# Patient Record
Sex: Female | Born: 1990 | Race: Black or African American
Health system: Southern US, Academic
[De-identification: ages and names within clinical notes are randomized; demographics above are authoritative.]

## PROBLEM LIST (undated history)

## (undated) ENCOUNTER — Encounter

## (undated) ENCOUNTER — Telehealth

## (undated) DIAGNOSIS — I82401 Acute embolism and thrombosis of unspecified deep veins of right lower extremity: Secondary | ICD-10-CM

## (undated) DIAGNOSIS — S82899A Other fracture of unspecified lower leg, initial encounter for closed fracture: Secondary | ICD-10-CM

## (undated) DIAGNOSIS — I824Y1 Acute embolism and thrombosis of unspecified deep veins of right proximal lower extremity: Secondary | ICD-10-CM

## (undated) DIAGNOSIS — E119 Type 2 diabetes mellitus without complications: Secondary | ICD-10-CM

## (undated) DIAGNOSIS — U071 COVID-19: Secondary | ICD-10-CM

---

## 2016-12-04 DIAGNOSIS — R739 Hyperglycemia, unspecified: Secondary | ICD-10-CM

## 2016-12-04 DIAGNOSIS — E118 Type 2 diabetes mellitus with unspecified complications: Secondary | ICD-10-CM

## 2016-12-04 DIAGNOSIS — K0889 Other specified disorders of teeth and supporting structures: Secondary | ICD-10-CM

## 2016-12-04 DIAGNOSIS — Z794 Long term (current) use of insulin: Secondary | ICD-10-CM

## 2016-12-04 DIAGNOSIS — E1165 Type 2 diabetes mellitus with hyperglycemia: Secondary | ICD-10-CM

## 2016-12-04 DIAGNOSIS — Z79899 Other long term (current) drug therapy: Secondary | ICD-10-CM

## 2016-12-04 DIAGNOSIS — S025XXA Fracture of tooth (traumatic), initial encounter for closed fracture: Secondary | ICD-10-CM

## 2016-12-04 DIAGNOSIS — K029 Dental caries, unspecified: Secondary | ICD-10-CM

## 2016-12-04 DIAGNOSIS — K0401 Reversible pulpitis: Principal | ICD-10-CM

## 2016-12-04 DIAGNOSIS — E119 Type 2 diabetes mellitus without complications: Principal | ICD-10-CM

## 2016-12-04 MED ORDER — HYDROCODONE-ACETAMINOPHEN 5-325 MG PO TABS
1 | ORAL_TABLET | Freq: Once | ORAL | Status: CP
Start: 2016-12-04 — End: ?

## 2016-12-04 MED ORDER — PENICILLIN V POTASSIUM 500 MG PO TABS
500 mg | Freq: Four times a day (QID) | ORAL | 0 refills | Status: CP
Start: 2016-12-04 — End: ?

## 2016-12-04 MED ORDER — CHLORHEXIDINE GLUCONATE 0.12 % MT SOLN
0 refills | Status: CP
Start: 2016-12-04 — End: ?

## 2016-12-04 MED ORDER — BOLUS IV FLUID JX
Freq: Once | INTRAVENOUS | Status: DC
Start: 2016-12-04 — End: 2016-12-05

## 2016-12-04 MED ORDER — HYDROCODONE-ACETAMINOPHEN 5-325 MG PO TABS
1 | ORAL_TABLET | Freq: Four times a day (QID) | ORAL | 0 refills | Status: CP | PRN
Start: 2016-12-04 — End: ?

## 2016-12-05 ENCOUNTER — Inpatient Hospital Stay: Admit: 2016-12-05 | Discharge: 2016-12-05

## 2016-12-05 NOTE — ED Provider Notes
Radial pulses are 2+ on the right side, and 2+ on the left side.        Dorsalis pedis pulses are 2+ on the right side, and 2+ on the left side.   Pulmonary/Chest: Effort normal and breath sounds normal. No stridor. No respiratory distress. She has no wheezes. She has no rales. She exhibits no tenderness.   Abdominal: Soft. Bowel sounds are normal. She exhibits no distension and no mass. There is no tenderness. There is no rebound and no guarding.   Musculoskeletal: She exhibits no edema, tenderness or deformity.   Lymphadenopathy:     She has no cervical adenopathy.   Neurological: She is alert and oriented to person, place, and time. No cranial nerve deficit.   Skin: Skin is warm. No rash noted. She is not diaphoretic. No erythema. No pallor.   Psychiatric: She has a normal mood and affect. Her behavior is normal.   Nursing note and vitals reviewed.      Differential DDx: ***    Is this an Emergent Medical Condition? {SH ED EMERGENT MEDICAL CONDITION:[phone]}  409.901 FS  641.19 FS  627.732 (16) FS    ED Workup   Procedures    Labs:  -   POCT GLUCOSE - Abnormal        Result Value Ref Range    Glucose (Meter) 413 (*) 60.0 - 99.0 mg/dL   POCT URINALYSIS AUTO W/O MICROSCOPY - Abnormal     Color -Ur Yellow      Clarity, UA Clear      Spec Grav 1.015  1.003 - 1.030    pH 6.5 (*) 7.4 - 7.4    Urobilinogen -Ur 1.0  <=2.0 E.U./dL    Nitrite -Ur Negative  Negative    Protein-UA Negative  Negative mg/dL    Glucose -Ur 161.0500.0 (*) Negative mg/dL    Blood, UA Trace (*) Negative    WBC, UA Negative  Negative    Bilirubin -Ur Negative  Negative    Ketones -UR Negative  Negative   POCT URINE PREGNANCY - Normal    Preg Test, Urine (POC) Negative  Negative   POCT GLUCOSE   POCT URINALYSIS AUTO W/O MICROSCOPY   POCT URINE PREGNANCY         Imaging (Read by ED Provider):  {Imaging findings:[id number]}      EKG (Read by ED Provider):  {EKG findings:[phone]}        ED Course & Re-Evaluation     ED Course

## 2016-12-05 NOTE — ED Triage Notes
26yo BF ambulates to triage with steady gait. C/O dental pain on the UR quadrant  x 3 weeks. Pt states she has bad taste in her mouth and can feel the small pieces of her tooth crumbling when she eat. Pt states her pain increases when she eats something sweet or cold.Denies difficulty swallowing or breathing. No facial swelling noted in triage. Denies taking  OTC medications to relive symptoms. Pt does not have a dentist at this time. VSS, RR even and unlabored, NAD noted at this tme. Pt to [facility] to await further evaluation.

## 2016-12-05 NOTE — ED Provider Notes
ED Disposition   ED Disposition: Discharge      ED Clinical Impression   ED Clinical Impression:   Pain, dental  Pulpitis  Blood glucose elevated  Type 2 diabetes mellitus with complication, without long-term current use of insulin      ED Patient Status   Patient Status:   Good        ED Medical Evaluation Initiated   Medical Evaluation Initiated:   Yes, filed at 12/04/16 2058  by Dionne MiloHavyarimana, Juvenal, MD             Dionne MiloHavyarimana, Juvenal, MD  Resident  12/05/16 [phone]

## 2016-12-05 NOTE — ED Provider Notes
Constitutional: Negative for fever.   HENT: Negative for nasal congestion, facial swelling, drooling, mouth sores and neck pain.    Neurological: Negative for headaches.       Physical Exam       ED Triage Vitals   BP 12/04/16 1955 123/81   Pulse 12/04/16 1955 111   Resp 12/04/16 1955 18   Temp 12/04/16 1955 36.9 ?C (98.5 ?F)   Temp src 12/04/16 1955 Oral   Height 12/04/16 1955 1.676 m   Weight 12/04/16 1955 85.7 kg   SpO2 12/04/16 1955 100 %   BMI (Calculated) 12/04/16 1955 30.57             Physical Exam    Differential DDx: ***    Is this an Emergent Medical Condition? {SH ED EMERGENT MEDICAL CONDITION:[id number]}  409.901 FS  641.19 FS  627.732 (16) FS    ED Workup   Procedures    Labs:  - - No data to display      Imaging (Read by ED Provider):  {Imaging findings:[phone]}      EKG (Read by ED Provider):  {EKG findings:[phone]}        ED Course & Re-Evaluation     ED Course     Dionne MiloJuvenal Havyarimana, MD 9:04 PM 12/04/2016        MDM   Decide to obtain history from someone other than the patient: {SH ED Lamonte SakaiJX MDM - OBTAIN ZOXWRUE:45409}HISTORY:28378}    Decide to obtain previous medical records: [facility]{SH ED Lamonte SakaiJX MDM - PREVIOUS MED REC - NO WJX:91478}YES:28380}    Clinical Lab Test(s): {SH ED Lamonte SakaiJX MDM ORDERED AND REVIEWED:28124}    Diagnostic Tests (Radiology, EKG): {SH ED Lamonte SakaiJX MDM ORDERED AND REVIEWED:28124}    Independent Visualization (ED US, Wet Prep, Other): {SH ED Lamonte SakaiJX MDM NO YES [id number]:86578}WILDCARD:26444}    Discussed patient with NON-ED Provider: {SH ED Lamonte SakaiJX MDM - ANOTHER PROVIDER:28381}      ED Disposition   ED Disposition: No ED Disposition Set      ED Clinical Impression   ED Clinical Impression:   No Clinical Impression Set      ED Patient Status   Patient Status:   {SH ED JX PATIENT STATUS:[phone]}        ED Medical Evaluation Initiated   Medical Evaluation Initiated:   No MEI documented***

## 2016-12-05 NOTE — ED Provider Notes
Diagnosis Date   ? Diabetes mellitus        History reviewed. No pertinent surgical history.    No family history on file.    Social History     Social History   ? Marital status: Single     Spouse name: N/A   ? Number of children: N/A   ? Years of education: N/A     Social History Main Topics   ? Smoking status: Never Smoker   ? Smokeless tobacco: None   ? Alcohol use No   ? Drug use: No   ? Sexual activity: Not Asked     Other Topics Concern   ? None     Social History Narrative   ? None       Review of Systems   Constitutional: Negative for fever, chills, diaphoresis, activity change, appetite change, fatigue and unexpected weight change.   HENT: Positive for dental problem. Negative for ear pain, nasal congestion, sore throat, facial swelling, nasal discharge, drooling, mouth sores, trouble swallowing, neck pain, sinus pressure, tinnitus and ear discharge.    Eyes: Negative for pain, discharge and visual disturbance.   Respiratory: Negative for apnea, cough, choking, chest tightness, shortness of breath, wheezing and stridor.    Cardiovascular: Negative for chest pain and leg swelling.   Gastrointestinal: Negative for nausea, vomiting, abdominal pain, diarrhea, constipation, blood in stool, abdominal distention, anal bleeding and rectal pain.   Genitourinary: Negative for dysuria, urgency, frequency, hematuria and difficulty urinating.   Musculoskeletal: Negative for back pain, joint swelling, arthralgias and gait problem.   Skin: Negative for color change, pallor and rash.   Neurological: Negative for dizziness, weakness, light-headedness and headaches.   Allergic/Immunologic: Negative for environmental allergies and food allergies.   Endocrine: Negative for polydipsia, polyuria and polyphagia.   All other systems reviewed and are negative.      Physical Exam       ED Triage Vitals   BP 12/04/16 1955 123/81   Pulse 12/04/16 1955 111   Resp 12/04/16 1955 18   Temp 12/04/16 1955 36.9 ?C (98.5 ?F)

## 2016-12-05 NOTE — ED Provider Notes
Pt states that she does not want IVF.  Dionne MiloJuvenal Havyarimana, MD 9:04 PM 12/04/2016  UA shows no ketones, she is not pregnant  Pt has glucometer at home. She states that she will recheck it. She states that it was high bcz today is her birth day and had a cake. Otherwise she controls her sugar well    We will treat with pen VK, peridex and dental follow.  Dionne MiloJuvenal Havyarimana, MD 9:59 PM 12/04/2016          MDM   Decide to obtain history from someone other than the patient: {SH ED Lamonte SakaiJX MDM - OBTAIN QIONGEX:52841}HISTORY:28378}    Decide to obtain previous medical records: [facility]{SH ED Lamonte SakaiJX MDM - PREVIOUS MED REC - NO LKG:40102}YES:28380}    Clinical Lab Test(s): {SH ED Lamonte SakaiJX MDM ORDERED AND REVIEWED:28124}    Diagnostic Tests (Radiology, EKG): {SH ED Lamonte SakaiJX MDM ORDERED AND REVIEWED:28124}    Independent Visualization (ED US, Wet Prep, Other): {SH ED Lamonte SakaiJX MDM NO YES VOZDGUYQ:03474}WILDCARD:26444}    Discussed patient with NON-ED Provider: {SH ED Lamonte SakaiJX MDM - ANOTHER PROVIDER:28381}      ED Disposition   ED Disposition: Discharge      ED Clinical Impression   ED Clinical Impression:   Pain, dental  Pulpitis  Blood glucose elevated  Type 2 diabetes mellitus with complication, without long-term current use of insulin      ED Patient Status   Patient Status:   [facility][phone]}        ED Medical Evaluation Initiated   Medical Evaluation Initiated:   Yes, filed at 12/04/16 2058  by Dionne MiloHavyarimana, Juvenal, MD

## 2016-12-05 NOTE — ED Provider Notes
History     Chief Complaint   Patient presents with   ? Dental Pain       HPI Comments: April Mullins is a 26 y.o. Female with pmh of DMII on insulin (30 Units of lantus, no sliding scale) who presents with right side tooth pain for 5 days. Tooth broke 3 wks ago and this last few days the pain started. She denies any dyspnea, pain on swallowing, fever chills.     Patient is a 26 y.o. female presenting with tooth pain.   Dental Pain   Location:  Upper  Upper teeth location:  1/RU 3rd molar  Quality:  Aching  Severity:  Moderate  Onset quality:  Gradual  Duration:  5 days  Timing:  Constant  Progression:  Worsening  Chronicity:  New  Context: cap fell off, dental caries, dental fracture and poor dentition    Context: not abscess, not crown fracture, not enamel fracture, filling intact, not intrusion, not malocclusion, not recent dental surgery and not trauma    Relieved by:  Nothing  Worsened by:  Nothing  Ineffective treatments:  None tried  Associated symptoms: no congestion, no difficulty swallowing, no drooling, no facial pain, no facial swelling, no fever, no gum swelling, no headaches, no neck pain, no neck swelling, no oral bleeding, no oral lesions and no trismus    Risk factors: diabetes and lack of dental care    Risk factors: no alcohol problem, no periodontal disease and no smoking        No Known Allergies    Patient's Medications   New Prescriptions    CHLORHEXIDINE (PERIDEX) 0.12 % MT SOLUTION    Rinse mouth with 15 mL (1 capful) for 30 seconds in the morning & evening after toothbrushing.  Spit after rinsing, do not swallow.    HYDROCODONE-ACETAMINOPHEN (NORCO) 5-325 MG PO TABLET    Take 1 tablet by mouth every 6 hours as needed for pain.    PENICILLIN V POTASSIUM (VEETID) 500 MG PO TABLET    Take 1 tablet by mouth 4 times daily for 7 days.   Previous Medications    No medications on file   Modified Medications    No medications on file   Discontinued Medications    No medications on file

## 2016-12-05 NOTE — ED Provider Notes
History     Chief Complaint   Patient presents with   ? Dental Pain       HPI Comments: April Mullins is a 26 y.o. Female with pmh of DMII on insulin (30 Units of lantus, no sliding scale) who presents with right side tooth pain for 5 days. Tooth broke 3 wks ago and this last few days the pain started. She denies any dyspnea, pain on swallowing, fever chills.     Patient is a 26 y.o. female presenting with tooth pain.   Dental Pain   Location:  Upper  Upper teeth location:  1/RU 3rd molar  Quality:  Aching  Severity:  Moderate  Onset quality:  Gradual  Duration:  5 days  Timing:  Constant  Progression:  Worsening  Chronicity:  New  Context: cap fell off, dental caries, dental fracture and poor dentition    Context: not abscess, not crown fracture, not enamel fracture, filling intact, not intrusion, not malocclusion, not recent dental surgery and not trauma    Relieved by:  Nothing  Worsened by:  Nothing  Ineffective treatments:  None tried  Associated symptoms: no congestion, no difficulty swallowing, no drooling, no facial pain, no facial swelling, no fever, no gum swelling, no headaches, no neck pain, no neck swelling, no oral bleeding, no oral lesions and no trismus    Risk factors: diabetes and lack of dental care    Risk factors: no alcohol problem, no periodontal disease and no smoking        No Known Allergies    Discharge Medication List as of 12/04/2016 10:04 PM      START taking these medications    Details   chlorhexidine (PERIDEX) 0.12 % MT Solution Rinse mouth with 15 mL (1 capful) for 30 seconds in the morning & evening after toothbrushing.  Spit after rinsing, do not swallow.Disp-480 mL, R-0, Normal      HYDROcodone-acetaminophen (NORCO) 5-325 MG PO Tablet Take 1 tablet by mouth every 6 hours as needed for pain.Disp-6 tablet, R-0, Normal      penicillin v potassium (VEETID) 500 MG PO Tablet Take 1 tablet by mouth 4 times daily for 7 days.Disp-28 tablet, R-0, Normal             Past Medical History:

## 2016-12-05 NOTE — ED Provider Notes
Past Medical History:   Diagnosis Date   ? Diabetes mellitus        History reviewed. No pertinent surgical history.    No family history on file.    Social History     Social History   ? Marital status: N/A     Spouse name: N/A   ? Number of children: N/A   ? Years of education: N/A     Social History Main Topics   ? Smoking status: Never Smoker   ? Smokeless tobacco: None   ? Alcohol use No   ? Drug use: No   ? Sexual activity: Not Asked     Other Topics Concern   ? None     Social History Narrative   ? None       Review of Systems   Constitutional: Negative for fever.   HENT: Negative for nasal congestion, facial swelling, drooling, mouth sores and neck pain.    Neurological: Negative for headaches.       Physical Exam       ED Triage Vitals   BP 12/04/16 1955 123/81   Pulse 12/04/16 1955 111   Resp 12/04/16 1955 18   Temp 12/04/16 1955 36.9 ?C (98.5 ?F)   Temp src 12/04/16 1955 Oral   Height 12/04/16 1955 1.676 m   Weight 12/04/16 1955 85.7 kg   SpO2 12/04/16 1955 100 %   BMI (Calculated) 12/04/16 1955 30.57             Physical Exam    Differential DDx: ***    Is this an Emergent Medical Condition? {SH ED EMERGENT MEDICAL CONDITION:[phone]}  409.901 FS  641.19 FS  627.732 (16) FS    ED Workup   Procedures    Labs:  -   POCT GLUCOSE - Abnormal        Result Value Ref Range    Glucose (Meter) 413 (*) 60.0 - 99.0 mg/dL   POCT URINALYSIS AUTO W/O MICROSCOPY - Abnormal     Color -Ur Yellow      Clarity, UA Clear      Spec Grav 1.015  1.003 - 1.030    pH 6.5 (*) 7.4 - 7.4    Urobilinogen -Ur 1.0  <=2.0 E.U./dL    Nitrite -Ur Negative  Negative    Protein-UA Negative  Negative mg/dL    Glucose -Ur 960.4500.0 (*) Negative mg/dL    Blood, UA Trace (*) Negative    WBC, UA Negative  Negative    Bilirubin -Ur Negative  Negative    Ketones -UR Negative  Negative   POCT URINE PREGNANCY - Normal    Preg Test, Urine (POC) Negative  Negative   POCT GLUCOSE   POCT URINALYSIS AUTO W/O MICROSCOPY   POCT URINE PREGNANCY

## 2016-12-05 NOTE — ED Notes
Time of discharge: 2226  PM., Patient discharged to  Home.  Patient discharged  ambulatory. to exit with belongings in  Stable condition.  Patient escorted by  no one., Written discharge instructions given to  patient.  Patient/recipient  verbalizes discharge instructions.

## 2016-12-05 NOTE — ED Provider Notes
Diagnostic Tests (Radiology, EKG): {SH ED Lamonte SakaiJX MDM ORDERED AND REVIEWED:28124}    Independent Visualization (ED US, Wet Prep, Other): {SH ED Lamonte SakaiJX MDM NO YES ZOXWRUEA:54098}WILDCARD:26444}    Discussed patient with NON-ED Provider: {SH ED Lamonte SakaiJX MDM - ANOTHER PROVIDER:28381}      ED Disposition   ED Disposition: No ED Disposition Set      ED Clinical Impression   ED Clinical Impression:   No Clinical Impression Set      ED Patient Status   Patient Status:   [phone]}        ED Medical Evaluation Initiated   Medical Evaluation Initiated:   Yes, filed at 12/04/16 2058  by Dionne MiloHavyarimana, Juvenal, MD

## 2016-12-05 NOTE — ED Provider Notes
History     Chief Complaint   Patient presents with   ? Dental Pain       HPI Comments: April Mullins is a 26 y.o. Female with pmh of DMII on insulin (30 Units of lantus, no sliding scale) who presents with right side tooth pain for 5 days. Tooth broke 3 wks ago and this last few days the pain started. She denies any dyspnea, pain on swallowing, fever chills.     Patient is a 26 y.o. female presenting with tooth pain.   Dental Pain   Location:  Upper  Upper teeth location:  1/RU 3rd molar  Quality:  Aching  Severity:  Moderate  Onset quality:  Gradual  Duration:  5 days  Timing:  Constant  Progression:  Worsening  Chronicity:  New  Context: cap fell off, dental caries, dental fracture and poor dentition    Context: not abscess, not crown fracture, not enamel fracture, filling intact, not intrusion, not malocclusion, not recent dental surgery and not trauma    Relieved by:  Nothing  Worsened by:  Nothing  Ineffective treatments:  None tried  Associated symptoms: no congestion, no difficulty swallowing, no drooling, no facial pain, no facial swelling, no fever, no gum swelling, no headaches, no neck pain, no neck swelling, no oral bleeding, no oral lesions and no trismus    Risk factors: diabetes and lack of dental care    Risk factors: no alcohol problem, no periodontal disease and no smoking        No Known Allergies    Patient's Medications    No medications on file       Past Medical History:   Diagnosis Date   ? Diabetes mellitus        History reviewed. No pertinent surgical history.    No family history on file.    Social History     Social History   ? Marital status: N/A     Spouse name: N/A   ? Number of children: N/A   ? Years of education: N/A     Social History Main Topics   ? Smoking status: Never Smoker   ? Smokeless tobacco: None   ? Alcohol use No   ? Drug use: No   ? Sexual activity: Not Asked     Other Topics Concern   ? None     Social History Narrative   ? None       Review of Systems

## 2016-12-05 NOTE — ED Provider Notes
Constitutional: Negative for fever.   HENT: Negative for nasal congestion, facial swelling, drooling, mouth sores and neck pain.    Neurological: Negative for headaches.       Physical Exam       ED Triage Vitals   BP 12/04/16 1955 123/81   Pulse 12/04/16 1955 111   Resp 12/04/16 1955 18   Temp 12/04/16 1955 36.9 ?C (98.5 ?F)   Temp src 12/04/16 1955 Oral   Height 12/04/16 1955 1.676 m   Weight 12/04/16 1955 85.7 kg   SpO2 12/04/16 1955 100 %   BMI (Calculated) 12/04/16 1955 30.57             Physical Exam    Differential DDx: ***    Is this an Emergent Medical Condition? {SH ED EMERGENT MEDICAL [phone]}  409.901 FS  641.19 FS  627.732 (16) FS    ED Workup   Procedures    Labs:  -   POCT GLUCOSE - Abnormal        Result Value Ref Range    Glucose (Meter) 413 (*) 60.0 - 99.0 mg/dL   POCT URINALYSIS AUTO W/O MICROSCOPY - Abnormal     Color -Ur Yellow      Clarity, UA Clear      Spec Grav 1.015  1.003 - 1.030    pH 6.5 (*) 7.4 - 7.4    Urobilinogen -Ur 1.0  <=2.0 E.U./dL    Nitrite -Ur Negative  Negative    Protein-UA Negative  Negative mg/dL    Glucose -Ur 161.0500.0 (*) Negative mg/dL    Blood, UA Trace (*) Negative    WBC, UA Negative  Negative    Bilirubin -Ur Negative  Negative    Ketones -UR Negative  Negative   POCT URINE PREGNANCY - Normal    Preg Test, Urine (POC) Negative  Negative   POCT GLUCOSE   POCT URINALYSIS AUTO W/O MICROSCOPY   POCT URINE PREGNANCY         Imaging (Read by ED Provider):  {Imaging findings:([phone]}      EKG (Read by ED Provider):  {EKG findings:[phone]}        ED Course & Re-Evaluation     ED Course     Dionne MiloJuvenal Havyarimana, MD 9:04 PM 12/04/2016        MDM   Decide to obtain history from someone other than the patient: {SH ED Lamonte SakaiJX MDM - OBTAIN RUEAVWU:98119}HISTORY:28378}    Decide to obtain previous medical records: [facility]{SH ED Lamonte SakaiJX MDM - PREVIOUS MED REC - NO JYN:82956}YES:28380}    Clinical Lab Test(s): {SH ED Lamonte SakaiJX MDM ORDERED AND REVIEWED:28124}

## 2016-12-05 NOTE — ED Provider Notes
Past Medical History:   Diagnosis Date   ? Diabetes mellitus        History reviewed. No pertinent surgical history.    No family history on file.    Social History     Social History   ? Marital status: N/A     Spouse name: N/A   ? Number of children: N/A   ? Years of education: N/A     Social History Main Topics   ? Smoking status: Never Smoker   ? Smokeless tobacco: None   ? Alcohol use No   ? Drug use: No   ? Sexual activity: Not Asked     Other Topics Concern   ? None     Social History Narrative   ? None       Review of Systems   Constitutional: Negative for fever.   HENT: Negative for nasal congestion, facial swelling, drooling, mouth sores and neck pain.    Neurological: Negative for headaches.       Physical Exam       ED Triage Vitals   BP 12/04/16 1955 123/81   Pulse 12/04/16 1955 111   Resp 12/04/16 1955 18   Temp 12/04/16 1955 36.9 ?C (98.5 ?F)   Temp src 12/04/16 1955 Oral   Height 12/04/16 1955 1.676 m   Weight 12/04/16 1955 85.7 kg   SpO2 12/04/16 1955 100 %   BMI (Calculated) 12/04/16 1955 30.57             Physical Exam   Constitutional: She is oriented to person, place, and time. She appears well-developed and well-nourished.  Non-toxic appearance. She does not have a sickly appearance. She does not appear ill. No distress.   HENT:   Head: Normocephalic and atraumatic.   Right Ear: External ear normal.   Left Ear: External ear normal.   Nose: Nose normal.   Mouth/Throat: Oropharynx is clear and moist. No oropharyngeal exudate.       Eyes: Conjunctivae are normal. Pupils are equal, round, and reactive to light. Right eye exhibits no discharge. Left eye exhibits no discharge. No scleral icterus.   Neck: Normal range of motion. Neck supple. No JVD present. No tracheal deviation present. No thyromegaly present.   Cardiovascular: Normal rate, regular rhythm, normal heart sounds and intact distal pulses.  Exam reveals no gallop and no friction rub.    No murmur heard.  Pulses:

## 2016-12-05 NOTE — ED Provider Notes
Temp src 12/04/16 1955 Oral   Height 12/04/16 1955 1.676 m   Weight 12/04/16 1955 85.7 kg   SpO2 12/04/16 1955 100 %   BMI (Calculated) 12/04/16 1955 30.57             Physical Exam   Constitutional: She is oriented to person, place, and time. She appears well-developed and well-nourished.  Non-toxic appearance. She does not have a sickly appearance. She does not appear ill. No distress.   HENT:   Head: Normocephalic and atraumatic.   Right Ear: External ear normal.   Left Ear: External ear normal.   Nose: Nose normal.   Mouth/Throat: Oropharynx is clear and moist. No oropharyngeal exudate.       Eyes: Conjunctivae are normal. Pupils are equal, round, and reactive to light. Right eye exhibits no discharge. Left eye exhibits no discharge. No scleral icterus.   Neck: Normal range of motion. Neck supple. No JVD present. No tracheal deviation present. No thyromegaly present.   Cardiovascular: Normal rate, regular rhythm, normal heart sounds and intact distal pulses.  Exam reveals no gallop and no friction rub.    No murmur heard.  Pulses:       Radial pulses are 2+ on the right side, and 2+ on the left side.        Dorsalis pedis pulses are 2+ on the right side, and 2+ on the left side.   Pulmonary/Chest: Effort normal and breath sounds normal. No stridor. No respiratory distress. She has no wheezes. She has no rales. She exhibits no tenderness.   Abdominal: Soft. Bowel sounds are normal. She exhibits no distension and no mass. There is no tenderness. There is no rebound and no guarding.   Musculoskeletal: She exhibits no edema, tenderness or deformity.   Lymphadenopathy:     She has no cervical adenopathy.   Neurological: She is alert and oriented to person, place, and time. No cranial nerve deficit.   Skin: Skin is warm. No rash noted. She is not diaphoretic. No erythema. No pallor.   Psychiatric: She has a normal mood and affect. Her behavior is normal.   Nursing note and vitals reviewed.

## 2016-12-05 NOTE — ED Provider Notes
Imaging (Read by ED Provider):  {Imaging findings:[phone]}      EKG (Read by ED Provider):  {EKG [phone]}        ED Course & Re-Evaluation     ED Course     Pt states that she does not want IVF.  Dionne MiloJuvenal Havyarimana, MD 9:04 PM 12/04/2016  UA shows no ketones, she is not pregnant  Pt has glucometer at home. She states that she will recheck it. She states that it was high bcz today is her birth day and had a cake. Otherwise she controls her sugar well    We will treat with pen VK, peridex and dental follow.  Dionne MiloJuvenal Havyarimana, MD 9:59 PM 12/04/2016          MDM   Decide to obtain history from someone other than the patient: {SH ED Lamonte SakaiJX MDM - OBTAIN ZOXWRUE:45409}HISTORY:28378}    Decide to obtain previous medical records: [facility]{SH ED Lamonte SakaiJX MDM - PREVIOUS MED REC - NO WJX:91478}YES:28380}    Clinical Lab Test(s): {SH ED Lamonte SakaiJX MDM ORDERED AND REVIEWED:28124}    Diagnostic Tests (Radiology, EKG): {SH ED Lamonte SakaiJX MDM ORDERED AND REVIEWED:28124}    Independent Visualization (ED US, Wet Prep, Other): {SH ED Lamonte SakaiJX MDM NO YES GNFAOZHY:86578}WILDCARD:26444}    Discussed patient with NON-ED Provider: {SH ED Lamonte SakaiJX MDM - ANOTHER PROVIDER:28381}      ED Disposition   ED Disposition: Discharge      ED Clinical Impression   ED Clinical Impression:   Pain, dental  Pulpitis  Blood glucose elevated  Type 2 diabetes mellitus with complication, without long-term current use of insulin      ED Patient Status   Patient Status:   {[facility]:[phone]}        ED Medical Evaluation Initiated   Medical Evaluation Initiated:   Yes, filed at 12/04/16 2058  by Dionne MiloHavyarimana, Juvenal, MD

## 2016-12-05 NOTE — ED Provider Notes
Differential DDx:  Periapical Abscess, Gingival Abscess, Periodontal Abscess, Ludwig's Angina, and others.      Is this an Emergent Medical Condition? Yes - Severe Pain/Acute Onset of Symptons  409.901 FS  641.19 FS  627.732 (16) FS    ED Workup   Procedures    Labs:  -   POCT GLUCOSE - Abnormal        Result Value Ref Range    Glucose (Meter) 413 (*) 60.0 - 99.0 mg/dL   POCT URINALYSIS AUTO W/O MICROSCOPY - Abnormal     Color -Ur Yellow      Clarity, UA Clear      Spec Grav 1.015  1.003 - 1.030    pH 6.5 (*) 7.4 - 7.4    Urobilinogen -Ur 1.0  <=2.0 E.U./dL    Nitrite -Ur Negative  Negative    Protein-UA Negative  Negative mg/dL    Glucose -Ur 161.0500.0 (*) Negative mg/dL    Blood, UA Trace (*) Negative    WBC, UA Negative  Negative    Bilirubin -Ur Negative  Negative    Ketones -UR Negative  Negative   POCT URINE PREGNANCY - Normal    Preg Test, Urine (POC) Negative  Negative   POCT GLUCOSE   POCT URINALYSIS AUTO W/O MICROSCOPY   POCT URINE PREGNANCY         Imaging (Read by ED Provider):  not applicable      EKG (Read by ED Provider):  not applicable        ED Course & Re-Evaluation     ED Course     Pt states that she does not want IVF.  Dionne MiloJuvenal Havyarimana, MD 9:04 PM 12/04/2016  UA shows no ketones, she is not pregnant  Pt has glucometer at home. She states that she will recheck it. She states that it was high bcz today is her birth day and had a cake. Otherwise she controls her sugar well    We will treat with pen VK, peridex and dental follow up. She is given a list of low rate dentist around the area and pt states that she will follow up  Dionne MiloJuvenal Havyarimana, MD 9:59 PM 12/04/2016          MDM   Decide to obtain history from someone other than the patient: No    Decide to obtain previous medical records: No    Clinical Lab Test(s): Ordered and Reviewed    Diagnostic Tests (Radiology, EKG): N/A    Independent Visualization (ED US, Wet Prep, Other): No    Discussed patient with NON-ED Provider: None

## 2019-03-06 ENCOUNTER — Encounter (HOSPITAL_COMMUNITY): Payer: Self-pay

## 2019-03-06 ENCOUNTER — Other Ambulatory Visit: Payer: Self-pay

## 2019-03-06 ENCOUNTER — Ambulatory Visit (HOSPITAL_COMMUNITY)
Admission: EM | Admit: 2019-03-06 | Discharge: 2019-03-06 | Disposition: A | Discharge status: Home / Self Care | Payer: Self-pay | Attending: Family Medicine | Admitting: Family Medicine

## 2019-03-06 DIAGNOSIS — R739 Hyperglycemia, unspecified: Secondary | ICD-10-CM

## 2019-03-06 HISTORY — DX: Other fracture of unspecified lower leg, initial encounter for closed fracture: S82.899A

## 2019-03-06 LAB — GLUCOSE, CAPILLARY: Glucose-Capillary: 461 mg/dL — ABNORMAL HIGH (ref 70–99)

## 2019-03-06 NOTE — ED Triage Notes (Signed)
t presents to have blood glucose level checked because she has concerns of it being elevated; pt states she has had issues with it in the past.

## 2019-03-06 NOTE — ED Notes (Addendum)
Pt eloped despite understanding how dangerously high her blood sugar is. Provider aware.

## 2019-03-06 NOTE — ED Provider Notes (Addendum)
From triage- patient recently moved here and has been out of metformin and insulin over past weeks.Patient left after her blood sugar was checked- 461. Patient was not evaluated nor thorough history obtained. Was not seen by provider, attempted to call patient to discuss, phone call did not go through, unable to leave voicemail.      Lew Dawes, PA-C 03/06/19 1110    Volney Reierson, [place] C, PA-C 03/06/19 1141

## 2019-04-11 ENCOUNTER — Ambulatory Visit (HOSPITAL_COMMUNITY)
Admission: EM | Admit: 2019-04-11 | Discharge: 2019-04-11 | Disposition: A | Discharge status: Home / Self Care | Payer: Self-pay | Attending: Family Medicine | Admitting: Family Medicine

## 2019-04-11 ENCOUNTER — Encounter (HOSPITAL_COMMUNITY): Payer: Self-pay | Admitting: Emergency Medicine

## 2019-04-11 DIAGNOSIS — Z202 Contact with and (suspected) exposure to infections with a predominantly sexual mode of transmission: Secondary | ICD-10-CM

## 2019-04-11 MED ORDER — AZITHROMYCIN 250 MG PO TABS
ORAL_TABLET | ORAL | Status: AC
  Filled 2019-04-11: qty 4

## 2019-04-11 MED ORDER — LIDOCAINE HCL (PF) 1 % IJ SOLN
INTRAMUSCULAR | Status: AC
  Filled 2019-04-11: qty 2

## 2019-04-11 MED ORDER — CEFTRIAXONE SODIUM 250 MG IJ SOLR
INTRAMUSCULAR | Status: AC
  Filled 2019-04-11: qty 250

## 2019-04-11 MED ORDER — CEFTRIAXONE SODIUM 250 MG IJ SOLR
250.0000 mg | Freq: Once | INTRAMUSCULAR | Status: AC
  Administered 2019-04-11: 250 mg via INTRAMUSCULAR

## 2019-04-11 MED ORDER — AZITHROMYCIN 250 MG PO TABS
1000.0000 mg | ORAL_TABLET | Freq: Once | ORAL | Status: AC
  Administered 2019-04-11: 1000 mg via ORAL

## 2019-04-11 NOTE — Discharge Instructions (Addendum)
Treated for gonorrhea and chlamydia today based on exposure Please refrain from sexual activity

## 2019-04-11 NOTE — ED Triage Notes (Signed)
Pt states two weeks ago she went to the hosptial and was tested for stds and it came back positive for gonorrhea and chlamydia. Pt states she wants treatment for it. States she was notified two weeks ago from [facility]. Pt has not had treatment. C/o vaginal discharge and odor.

## 2019-04-13 NOTE — ED Provider Notes (Signed)
[facility]    CSN: [id number] Arrival date & time: 04/11/19  1719     History   Chief Complaint Chief Complaint  Patient presents with  . Appointment    1740  . Exposure to STD    HPI Annette [name] is a 28 y.o. female.   Patient is a 28 year old female that presents today for STD treatment.  [facility] and her swab came back positive for gonorrhea and chlamydia.  She reports she was notified 2 weeks ago of this.  She has not had any treatment.  She denies any vaginal discharge, itching, irritation or odor.  She denies any dysuria, hematuria, urinary frequency or abdominal pain.  ROS per HPI      Past Medical History:  Diagnosis Date  . Ankle fracture     There are no active problems to display for this patient.   History reviewed. No pertinent surgical history.  OB History   No obstetric history on file.      Home Medications    Prior to Admission medications   Not on File    Family History Family History  Problem Relation Age of Onset  . Diabetes Mother   . Diabetes Other     Social History Social History   Tobacco Use  . Smoking status: Never Smoker  Substance Use Topics  . Alcohol use: Not on file  . Drug use: Not on file     Allergies   Patient has no known allergies.   Review of Systems Review of Systems   Physical Exam Triage Vital Signs ED Triage Vitals [04/11/19 1758]  Enc Vitals Group     BP (!) 126/98     Pulse Rate (!) 105     Resp 16     Temp 98.6 F (37 C)     Temp src      SpO2 100 %     Weight      Height      Head Circumference      Peak Flow      Pain Score 0     Pain Loc      Pain Edu?      Excl. in GC?    No data found.  Updated Vital Signs BP (!) 126/98   Pulse (!) 105   Temp 98.6 F (37 C)   Resp 16   LMP 04/04/2019   SpO2 100%   Visual Acuity Right Eye Distance:   Left Eye Distance:   Bilateral Distance:    Right Eye Near:    Left Eye Near:    Bilateral Near:     Physical Exam Vitals signs and nursing note reviewed.  Constitutional:      General: She is not in acute distress.    Appearance: Normal appearance. She is not ill-appearing, toxic-appearing or diaphoretic.  HENT:     Head: Normocephalic.     Nose: Nose normal.     Mouth/Throat:     Pharynx: Oropharynx is clear.  Eyes:     Conjunctiva/sclera: Conjunctivae normal.  Neck:     Musculoskeletal: Normal range of motion.  Pulmonary:     Effort: Pulmonary effort is normal.  Abdominal:     Palpations: Abdomen is soft.     Tenderness: There is no abdominal tenderness.  Musculoskeletal: Normal range of motion.  Skin:    General: Skin is warm and dry.     Findings: No rash.  Neurological:     Mental Status: She is alert.  Psychiatric:        Mood and Affect: Mood normal.      UC Treatments / Results  Labs (all labs ordered are listed, but only abnormal results are displayed) Labs Reviewed - No data to display  EKG   Radiology No results found.  Procedures Procedures (including critical care time)  Medications Ordered in UC Medications  cefTRIAXone (ROCEPHIN) injection 250 mg (250 mg Intramuscular Given 04/11/19 1825)  azithromycin (ZITHROMAX) tablet 1,000 mg (1,000 mg Oral Given 04/11/19 1824)  azithromycin (ZITHROMAX) 250 MG tablet (has no administration in time range)  cefTRIAXone (ROCEPHIN) 250 MG injection (has no administration in time range)  lidocaine (PF) (XYLOCAINE) 1 % injection (has no administration in time range)    Initial Impression / Assessment and Plan / UC Course  I have reviewed the triage vital signs and the nursing notes.  Pertinent labs & imaging results that were available during my care of the patient were reviewed by me and considered in my medical decision making (see chart for details).     Treated for gonorrhea and chlamydia today based on exposure Instructed to refrain from sexual activity x1 week.  Final Clinical Impressions(s) / UC Diagnoses   Final diagnoses:  Exposure to STD     Discharge Instructions     Treated for gonorrhea and chlamydia today based on exposure Please refrain from sexual activity    ED Prescriptions    None     Controlled Substance Prescriptions [place] Controlled Substance Registry consulted? no   Loura Halt A, NP 04/13/19 0900

## 2019-09-16 ENCOUNTER — Observation Stay (HOSPITAL_COMMUNITY)
Admission: EM | Admit: 2019-09-16 | Discharge: 2019-09-17 | Disposition: A | Discharge status: Home / Self Care | Payer: Medicaid Other | Attending: Family Medicine | Admitting: Family Medicine

## 2019-09-16 ENCOUNTER — Encounter (HOSPITAL_COMMUNITY): Payer: Self-pay | Admitting: Emergency Medicine

## 2019-09-16 ENCOUNTER — Ambulatory Visit (HOSPITAL_COMMUNITY)
Admission: EM | Admit: 2019-09-16 | Discharge: 2019-09-16 | Disposition: A | Discharge status: Nursing Home (Includes ICF) | Payer: Self-pay | Attending: Internal Medicine | Admitting: Internal Medicine

## 2019-09-16 ENCOUNTER — Emergency Department (HOSPITAL_COMMUNITY): Payer: Medicaid Other

## 2019-09-16 ENCOUNTER — Emergency Department (HOSPITAL_BASED_OUTPATIENT_CLINIC_OR_DEPARTMENT_OTHER): Payer: Medicaid Other

## 2019-09-16 ENCOUNTER — Encounter (HOSPITAL_COMMUNITY): Payer: Self-pay | Admitting: *Deleted

## 2019-09-16 ENCOUNTER — Other Ambulatory Visit: Payer: Self-pay

## 2019-09-16 DIAGNOSIS — I82411 Acute embolism and thrombosis of right femoral vein: Secondary | ICD-10-CM | POA: Insufficient documentation

## 2019-09-16 DIAGNOSIS — Z794 Long term (current) use of insulin: Secondary | ICD-10-CM | POA: Insufficient documentation

## 2019-09-16 DIAGNOSIS — E119 Type 2 diabetes mellitus without complications: Secondary | ICD-10-CM | POA: Insufficient documentation

## 2019-09-16 DIAGNOSIS — B349 Viral infection, unspecified: Secondary | ICD-10-CM

## 2019-09-16 DIAGNOSIS — I82401 Acute embolism and thrombosis of unspecified deep veins of right lower extremity: Secondary | ICD-10-CM

## 2019-09-16 DIAGNOSIS — R000 Tachycardia, unspecified: Secondary | ICD-10-CM | POA: Insufficient documentation

## 2019-09-16 DIAGNOSIS — I82431 Acute embolism and thrombosis of right popliteal vein: Secondary | ICD-10-CM | POA: Insufficient documentation

## 2019-09-16 DIAGNOSIS — I82461 Acute embolism and thrombosis of right calf muscular vein: Secondary | ICD-10-CM | POA: Insufficient documentation

## 2019-09-16 DIAGNOSIS — I82441 Acute embolism and thrombosis of right tibial vein: Secondary | ICD-10-CM | POA: Insufficient documentation

## 2019-09-16 DIAGNOSIS — I824Y1 Acute embolism and thrombosis of unspecified deep veins of right proximal lower extremity: Secondary | ICD-10-CM

## 2019-09-16 DIAGNOSIS — I2699 Other pulmonary embolism without acute cor pulmonale: Secondary | ICD-10-CM

## 2019-09-16 DIAGNOSIS — R262 Difficulty in walking, not elsewhere classified: Secondary | ICD-10-CM | POA: Insufficient documentation

## 2019-09-16 DIAGNOSIS — Z833 Family history of diabetes mellitus: Secondary | ICD-10-CM | POA: Insufficient documentation

## 2019-09-16 DIAGNOSIS — J189 Pneumonia, unspecified organism: Secondary | ICD-10-CM | POA: Insufficient documentation

## 2019-09-16 DIAGNOSIS — Z202 Contact with and (suspected) exposure to infections with a predominantly sexual mode of transmission: Secondary | ICD-10-CM | POA: Insufficient documentation

## 2019-09-16 DIAGNOSIS — I82451 Acute embolism and thrombosis of right peroneal vein: Secondary | ICD-10-CM | POA: Insufficient documentation

## 2019-09-16 DIAGNOSIS — U071 COVID-19: Secondary | ICD-10-CM

## 2019-09-16 DIAGNOSIS — E1169 Type 2 diabetes mellitus with other specified complication: Secondary | ICD-10-CM

## 2019-09-16 HISTORY — DX: Other pulmonary embolism without acute cor pulmonale: I26.99

## 2019-09-16 HISTORY — DX: Type 2 diabetes mellitus without complications: E11.9

## 2019-09-16 LAB — BASIC METABOLIC PANEL
Anion gap: 14 (ref 5–15)
BUN: 10 mg/dL (ref 6–20)
CO2: 18 mmol/L — ABNORMAL LOW (ref 22–32)
Calcium: 8.9 mg/dL (ref 8.9–10.3)
Chloride: 104 mmol/L (ref 98–111)
Creatinine, Ser: 0.65 mg/dL (ref 0.44–1.00)
GFR calc Af Amer: >60 mL/min (ref >60)
GFR calc non Af Amer: >60 mL/min (ref >60)
Glucose, Bld: 274 mg/dL — ABNORMAL HIGH (ref 70–99)
Potassium: 4.3 mmol/L (ref 3.5–5.1)
Sodium: 136 mmol/L (ref 135–145)

## 2019-09-16 LAB — POC SARS CORONAVIRUS 2 AG -  ED: SARS Coronavirus 2 Ag: POSITIVE — AB

## 2019-09-16 LAB — CBC
HCT: 50.2 % — ABNORMAL HIGH (ref 36.0–46.0)
Hemoglobin: 16 g/dL — ABNORMAL HIGH (ref 12.0–15.0)
MCH: 28.2 pg (ref 26.0–34.0)
MCHC: 31.9 g/dL (ref 30.0–36.0)
MCV: 88.5 fL (ref 80.0–100.0)
Platelets: 178 10*3/uL (ref 150–400)
RBC: 5.67 MIL/uL — ABNORMAL HIGH (ref 3.87–5.11)
RDW: 11.9 % (ref 11.5–15.5)
WBC: 4.3 10*3/uL (ref 4.0–10.5)
nRBC: 0 % (ref 0.0–0.2)

## 2019-09-16 LAB — I-STAT BETA HCG BLOOD, ED (MC, WL, AP ONLY): I-stat hCG, quantitative: <5 m[IU]/mL (ref <5)

## 2019-09-16 LAB — TROPONIN I (HIGH SENSITIVITY)
Troponin I (High Sensitivity): 2 ng/L (ref <18)
Troponin I (High Sensitivity): 3 ng/L (ref <18)

## 2019-09-16 LAB — POC SARS CORONAVIRUS 2 AG: SARS Coronavirus 2 Ag: POSITIVE — AB

## 2019-09-16 MED ORDER — HEPARIN BOLUS VIA INFUSION
5000.0000 [IU] | Freq: Once | INTRAVENOUS | Status: AC
  Administered 2019-09-17: 5000 [IU] via INTRAVENOUS
  Filled 2019-09-16: qty 5000

## 2019-09-16 MED ORDER — ACETAMINOPHEN 325 MG PO TABS: 650.0000 mg | ORAL_TABLET | Freq: Four times a day (QID) | ORAL | Status: DC | PRN

## 2019-09-16 MED ORDER — IOHEXOL 350 MG/ML SOLN
75.0000 mL | Freq: Once | INTRAVENOUS | Status: AC | PRN
  Administered 2019-09-16: 75 mL via INTRAVENOUS

## 2019-09-16 MED ORDER — ONDANSETRON HCL 4 MG/2ML IJ SOLN: 4.0000 mg | Freq: Four times a day (QID) | INTRAMUSCULAR | Status: DC | PRN

## 2019-09-16 MED ORDER — HEPARIN (PORCINE) 25000 UT/250ML-% IV SOLN
1300.0000 [IU]/h | INTRAVENOUS | Status: DC
  Administered 2019-09-17: 1450 [IU]/h via INTRAVENOUS
  Filled 2019-09-16: qty 250

## 2019-09-16 MED ORDER — ONDANSETRON HCL 4 MG PO TABS: 4.0000 mg | ORAL_TABLET | Freq: Four times a day (QID) | ORAL | Status: DC | PRN

## 2019-09-16 MED ORDER — ACETAMINOPHEN 650 MG RE SUPP: 650.0000 mg | Freq: Four times a day (QID) | RECTAL | Status: DC | PRN

## 2019-09-16 MED ORDER — SODIUM CHLORIDE 0.9% FLUSH
3.0000 mL | Freq: Once | INTRAVENOUS | Status: AC
  Administered 2019-09-16: 22:00:00 3 mL via INTRAVENOUS

## 2019-09-16 MED ORDER — INSULIN ASPART 100 UNIT/ML ~~LOC~~ SOLN
0.0000 [IU] | Freq: Three times a day (TID) | SUBCUTANEOUS | Status: DC
  Administered 2019-09-17 (×2): 5 [IU] via SUBCUTANEOUS

## 2019-09-16 MED ORDER — INSULIN GLARGINE 100 UNIT/ML ~~LOC~~ SOLN
10.0000 [IU] | Freq: Every day | SUBCUTANEOUS | Status: DC
  Filled 2019-09-16: qty 0.1

## 2019-09-16 MED ORDER — INSULIN ASPART 100 UNIT/ML ~~LOC~~ SOLN
0.0000 [IU] | Freq: Every day | SUBCUTANEOUS | Status: DC
  Administered 2019-09-17: 4 [IU] via SUBCUTANEOUS

## 2019-09-16 MED ORDER — OXYCODONE-ACETAMINOPHEN 5-325 MG PO TABS
1.0000 | ORAL_TABLET | Freq: Once | ORAL | Status: AC
  Administered 2019-09-16: 1 via ORAL
  Filled 2019-09-16: qty 1

## 2019-09-16 NOTE — ED Triage Notes (Addendum)
Pt states her right calf has been swollen and painful for two days but worse when she woke up this morning. Pt states she tried to take a bath this morning to help with the pain and became short of breath and felt anxious. Pt arrives to ED after going to UC- sent to r/o blood clot.   Pt also tested + for covid with poc test at urgent care.

## 2019-09-16 NOTE — H&P (Addendum)
Family Medicine Teaching [facility] Admission History and Physical Service Pager: [phone]  Patient name: Annette [name] Medical record number: [id number] Date of birth: 09/01/1991 Age: 28 y.o. Gender: female  Primary Care Provider: Patient, No Pcp Per Consultants: None Code Status: Full Preferred Emergency Contact: Silas FloodJennifer Thomas [phone]  Chief Complaint: Leg pain  Assessment and Plan: Annette [name] is a [age] y.o. female presenting with leg pain and chest discomfort. PMH is significant for diabetes.   Chest Pain/Calf Pain/Tachycardia/DVT/PE Micah FlesherWent to urgent care with complaints of leg pain and was sent to ED for DVT r/o. Symptoms of leg pain started 3 days ago. Associated URI symptoms, low grade fever with SOB and chest discomfort. She is comfortable on admission and in no acute distress. Right leg remain painful and edematous. EKG w/ sinus tachycardia. H/H 16/50. No personal h/o of PE or DVT but mother with h/o of DVT. Denies recent surgery, travel, has been lying around d/t leg pain, is not on birth control. CTA chest w/ PE. No hypotension or evidence R heart strain. U/s w/ DVT of right leg involving multiples veins (see chart or summary below). Will start heparin drip for now with the consideration to start DOAC on 12/15. Troponins negative. CTA showing bilateral ground glass opacities consistent with covid (discussed below). Likely contributing somewhat to mild chest pain. - Admit to inpatient family medicine, Observation, Dr. Jennette KettleNeal, Telemetry floor -Heparin drip gtt, pharmacy to dose -Follow-up CBC, CMP -Continuous pulse ox monitoring -Cardiac monitoring - PT/eval and treat-> pulse ox with ambulation 2/2 covid - vital signs per protocol - consider transition to doac 12/15  COVID Positive/ Bilateral Atypical Pneumonia Chest discomfort started 1 week ago. URI symptoms, low grade fever with SOB. Patient thought she had a cold and has been taking cold medicine and feeling better.  Denies recent sick contacts and has only been around her co-workers; whom have been notified per patient report. Afebrile on admission although reported recent low grade temp to ED provider. Tmax in ED 99.7. Bilateral ground glass opacities seen on CTA. Given no oxygen requirement, no indication to start remdesivir/dexamethasone. Will not draw typical covid labs at this time. -Isolation and droplet precautions -continue to monitor respiratory status and fever curve - pt eval and treat, pulse ox with ambulation  Diabetes Hyperglycemic episode 01/04/2019 glucose 313. Left AMA. Glucose on BMP at time of admission 274. No A1c on file but records in Care Everywhere state last A1c was 11. Home medication is Lantus 20 units. -Obtain hgb A1c -CBGs QID ac&hs -mealtime sSSI, HS sensitive sliding scale -Lantus 10 units -modified carb diet  Exposure to STI Exposed to G/C from boyfriend in May or June was treated in July. No test of cure on file. Patient without symptoms at this time. She is still with the same partner. Patient on facetime with family did not get to talk in depth.  -f/u HIV labs  FEN/GI: Modified carb diet Prophylaxis: Heparin gtt  Disposition: Admit to telemetry, FPTS Attending Dr. Jennette KettleNeal  History of Present Illness:  Annette [name] is a 28 y.o. female presenting with right leg pain and chest discomfort.  Leg pain started 3 days ago chest discomfort started 1 week ago patient with cold symptoms relieved with cold medicine.  Patient noticed leg was very painful and could not walk on it so she went to urgent care.  She does not have a history of DVTs but her mother does.  She denies recent surgery, recent travel, recent immobility other than laying  in the bed due to feeling sick.  She is not on birth control.  She does not endorse any drug use but occasional alcohol use once per month.  She did not suspect that she was Covid positive and has been around workers at [employer] on [employer]. She denies recent contacts.   Review Of Systems: Per HPI with the following additions:   Review of Systems  Constitutional: Negative for fever.  HENT: Positive for congestion.   Respiratory: Positive for shortness of breath.   Cardiovascular: Negative for chest pain.  Gastrointestinal: Negative for abdominal pain and nausea.  Genitourinary: Negative for dysuria.  Musculoskeletal: Positive for myalgias.    Patient Active Problem List   Diagnosis Date Noted  . Pulmonary embolus ([place]) 09/16/2019    Past Medical History: Past Medical History:  Diagnosis Date  . Ankle fracture   . Diabetes mellitus without complication [facility])     Past Surgical History: No past surgical history on file.  Social History: Social History   Tobacco Use  . Smoking status: Never Smoker  . Smokeless tobacco: Never Used  Substance Use Topics  . Alcohol use: Not on file  . Drug use: Not on file   Additional social history: Drinks alcohol 1x month Please also refer to relevant sections of EMR.  Family History: Family History  Problem Relation Age of Onset  . Diabetes Mother   . Diabetes Other     Allergies and Medications: No Known Allergies No current facility-administered medications on file prior to encounter.   Current Outpatient Medications on File Prior to Encounter  Medication Sig Dispense Refill  . insulin glargine (LANTUS) 100 UNIT/ML injection Inject 20 Units into the skin daily.       Objective: BP (!) 127/94   Pulse (!) 113   Temp 99.7 F (37.6 C) (Oral)   Resp 13   Ht 5\' 7"  (1.702 m)   Wt 85.7 kg   LMP 08/17/2019   SpO2 99%   BMI 29.60 kg/m  Exam:  General: 28 year old AA female, Appears tired, no acute distress. Age appropriate. Lying supine in bed. Cardiac: tachycardic regular rhythm, normal heart sounds, no murmurs Respiratory: diminished breath sounds on the right. CTA on the left, normal effort Extremities:RLE edema. Pedal pulses palpable  bilaterally  Skin: Warm and dry, no rashes noted Neuro: alert and oriented, no focal deficits Psych: normal affect   Labs and Imaging: CBC BMET  Recent Labs  Lab 09/16/19 1812  WBC 4.3  HGB 16.0*  HCT 50.2*  PLT 178   Recent Labs  Lab 09/16/19 1812  NA 136  K 4.3  CL 104  CO2 18*  BUN 10  CREATININE 0.65  GLUCOSE 274*  CALCIUM 8.9    Status:  Final result Visible to patient:  No (inaccessible in MyChart) Next appt:  None  Ref Range & Units 15:14 15:14  SARS Coronavirus 2 Ag NEGATIVE POSITIVEAbnormal   POSITIVEAbnormal  CM         EKG: Sinus tachycardia Right atrial enlargement  CT ANGIOGRAPHY CHEST WITH CONTRAST COMPARISON:  None. IMPRESSION: 1. Study is positive for acute pulmonary emboli involving the main right pulmonary artery with extension into the lobar branches of the right upper and right lower lobes. There are additional bilateral segmental and subsegmental pulmonary emboli. There is no CT evidence for right-sided heart strain. 2. Scattered ground-glass airspace opacities bilaterally. Differential considerations include developing pulmonary infarcts in the setting of pulmonary emboli versus an atypical  infectious process such as viral pneumonia. 3. Trace right-sided pleural effusion.  Lower Venous Study Right: Findings consistent with acute deep vein thrombosis involving the right femoral vein, right popliteal vein, right posterior tibial veins, right peroneal veins, and right gastrocnemius veins. Left: No evidence of common femoral vein obstruction.    Lavonda Jumbo, DO 09/16/2019, 10:55 PM PGY-1, [place] [facility] FPTS Intern pager: [phone], text pages welcome ------------------------------------------------------------------------------------------ Upper Level Addendum: I have seen and evaluated this patient along with Dr. Salvadore Dom and reviewed the above note, making necessary revisions in brown.  Myrene Buddy  MD PGY-3 Family Medicine Resident

## 2019-09-16 NOTE — ED Notes (Signed)
Patient transported to CT 

## 2019-09-16 NOTE — ED Triage Notes (Addendum)
Patient reports waking up with severe right calf and ankle pain, denies any injury. No recent travel. Right leg feels tight, no warmth or coolness difference when compared to left. Patient appears very uncomfortable. Denies any numbness or tingling to toes. Full range of motion.

## 2019-09-16 NOTE — ED Notes (Signed)
Dr. Kathrynn Humble notified that the IV infiltrated in ct and pt requesting pain meds. Updated pt on the same

## 2019-09-16 NOTE — Progress Notes (Signed)
[place] for heparin Indication: pulmonary embolus  Heparin Dosing Weight: 79.6 kg  Labs: Recent Labs    09/16/19 1812  HGB 16.0*  HCT 50.2*  PLT 178  CREATININE 0.65    Assessment: 44 yof presenting COVID-19 positive with acute RLE DVT, PE with no RHS. Pharmacy consulted to dose heparin. Patient is not on anticoagulation PTA. Hg 16, plt 178 on admit. No active bleed issues documented. Per patient, she is 5'7" and weighs 189 lbs.  Goal of Therapy:  Heparin level 0.3-0.7 units/ml Monitor platelets by anticoagulation protocol: Yes   Plan:  Heparin 5000 unit bolus Start heparin at 1400 units/h 6h heparin level Daily heparin level/CBC Monitor s/sx bleeding   Elicia Lamp, PharmD, BCPS Clinical Pharmacist 09/16/2019 10:40 PM

## 2019-09-16 NOTE — ED Notes (Signed)
Pt called out upset and aggravated b/c we will not let her mother back to see her. I let Pt know she is Covid Positive and we can not risk her getting Covid. Pt also does not understand why we are not giving her medication to help her. I let Pt know we are still working on getting her an IV to help her. Pt still upset and saying we are not doing anything to help her.

## 2019-09-16 NOTE — Progress Notes (Signed)
Right lower ext venous duplex  has been completed. Refer to [facility] under chart review to view preliminary results. Positive results given to Eilene Ghazi.   09/16/2019  4:43 PM Jasneet Schobert, Bonnye Fava

## 2019-09-16 NOTE — Discharge Instructions (Addendum)
Patient is recommended to go to the emergency department for further work-up.  Concerned that she may have DVT of the right lower extremity.  Given that the patient has tachycardia and some shortness of breath, she may have pulmonary embolism.

## 2019-09-16 NOTE — ED Notes (Signed)
Pt going to the ED via wheelchair x1 RN, Kalman Shan.  Pt is A&O.

## 2019-09-16 NOTE — ED Provider Notes (Signed)
[facility]    CSN: [id number] Arrival date & time: 09/16/19  1326      History   Chief Complaint Chief Complaint  Patient presents with  . Leg Pain    HPI Annette [name] is a 28 y.o. female with history of still independent diabetes mellitus comes to urgent care with a 1 day history of right calf pain and swelling.  Patient says symptoms started suddenly this morning when she woke up.  Pain is severe-8 out of 10, constant, achy and throbbing.  Aggravated by bearing weight.  No known relieving factors.  She has not tried any over-the-counter medications.  Patient also complains of fever greater than 100.0 Fahrenheit over the last couple of days.  She currently complains of some nasal congestion and sniffles.  She admits to having some palpitations and mild shortness of breath.  No chest pain.  No chest pressure.  No dizziness, near syncope or syncopal episodes.  Patient denies any sick contacts.  No radiation of pain.  HPI  Past Medical History:  Diagnosis Date  . Ankle fracture   . Diabetes mellitus without complication (HCC)     There are no problems to display for this patient.   History reviewed. No pertinent surgical history.  OB History   No obstetric history on file.      Home Medications    Prior to Admission medications   Medication Sig Start Date End Date Taking? Authorizing Provider  insulin glargine (LANTUS) 100 UNIT/ML injection Inject into the skin daily.   Yes [provider]    Family History Family History  Problem Relation Age of Onset  . Diabetes Mother   . Diabetes Other     Social History Social History   Tobacco Use  . Smoking status: Never Smoker  . Smokeless tobacco: Never Used  Substance Use Topics  . Alcohol use: Not on file  . Drug use: Not on file     Allergies   Patient has no known allergies.   Review of Systems Review of Systems  Constitutional: Positive for activity change and fever. Negative  for chills and fatigue.  HENT: Positive for congestion and rhinorrhea. Negative for ear discharge, ear pain and sore throat.   Eyes: Negative.   Respiratory: Positive for shortness of breath. Negative for cough, chest tightness and wheezing.   Cardiovascular: Positive for palpitations and leg swelling. Negative for chest pain.  Gastrointestinal: Negative for abdominal pain, diarrhea, nausea and vomiting.  Genitourinary: Negative.   Musculoskeletal: Negative for arthralgias, gait problem, joint swelling, neck pain and neck stiffness.  Skin: Negative for color change, rash and wound.  Neurological: Negative for dizziness, weakness, light-headedness, numbness and headaches.  Hematological: Negative for adenopathy. Does not bruise/bleed easily.  Psychiatric/Behavioral: Negative for confusion and decreased concentration.  All other systems reviewed and are negative.    Physical Exam Triage Vital Signs ED Triage Vitals  Enc Vitals Group     BP 09/16/19 1418 125/87     Pulse Rate 09/16/19 1418 (!) 118     Resp 09/16/19 1418 17     Temp 09/16/19 1418 98 F (36.7 C)     Temp src --      SpO2 09/16/19 1418 100 %     Weight --      Height --      Head Circumference --      Peak Flow --      Pain Score 09/16/19 1415 8     Pain  Loc --      Pain Edu? --      Excl. in GC? --    No data found.  Updated Vital Signs BP 125/87 (BP Location: Right Arm)   Pulse (!) 118   Temp 98 F (36.7 C)   Resp 17   LMP 08/17/2019   SpO2 100%   Visual Acuity Right Eye Distance:   Left Eye Distance:   Bilateral Distance:    Right Eye Near:   Left Eye Near:    Bilateral Near:     Physical Exam Constitutional:      General: She is not in acute distress.    Appearance: She is ill-appearing. She is not toxic-appearing.  HENT:     Head: Atraumatic.     Mouth/Throat:     Mouth: Mucous membranes are moist.     Pharynx: No posterior oropharyngeal erythema.  Eyes:     Extraocular Movements:  Extraocular movements intact.     Conjunctiva/sclera: Conjunctivae normal.  Cardiovascular:     Rate and Rhythm: Regular rhythm. Tachycardia present.     Pulses: Normal pulses.     Heart sounds: No murmur. No friction rub.  Pulmonary:     Effort: Pulmonary effort is normal. No respiratory distress.     Breath sounds: No wheezing or rhonchi.  Abdominal:     General: Bowel sounds are normal. There is no distension.     Tenderness: There is no guarding or rebound.  Musculoskeletal:        General: Swelling and tenderness present. No deformity. Normal range of motion.     Cervical back: Normal range of motion. No rigidity or tenderness.  Skin:    General: Skin is warm.     Capillary Refill: Capillary refill takes less than 2 seconds.     Coloration: Skin is not jaundiced.     Findings: No erythema or lesion.  Neurological:     General: No focal deficit present.     Mental Status: She is alert and oriented to person, place, and time.     Cranial Nerves: No cranial nerve deficit.     Sensory: No sensory deficit.  Psychiatric:        Mood and Affect: Mood normal.        Behavior: Behavior normal.      UC Treatments / Results  Labs (all labs ordered are listed, but only abnormal results are displayed) Labs Reviewed - No data to display  EKG   Radiology No results found.  Procedures Procedures (including critical care time)  Medications Ordered in UC Medications - No data to display  Initial Impression / Assessment and Plan / UC Course  I have reviewed the triage vital signs and the nursing notes.  Pertinent labs & imaging results that were available during my care of the patient were reviewed by me and considered in my medical decision making (see chart for details).     1.  Right calf pain concerning for right DVT: Patient will need further work-up to confirm the diagnosis Recommendation for further care in the ED was given to the patient.  She is agreeable to being  transported by the clinical staff member to the ED. I spoke with the patient's mother over the phone after the patient gave me permission to do that.  2.  Sinus tachycardia in the setting of right DVT concerning for pulmonary embolism: Patient is tachycardic without tachypnea.  Pulse oximetry is 98% on room air EKG shows  sinus tachycardia with an isolated ST elevation in lead V5.  3.  Insulin-dependent diabetes mellitus, noncompliance: Patient's is encouraged to be compliant with her medications as well as diet and exercise.  4.  Upper respiratory infection symptoms with fever: COVID-19 point of care testing done. Final Clinical Impressions(s) / UC Diagnoses   Final diagnoses:  None   Discharge Instructions   None    ED Prescriptions    None     PDMP not reviewed this encounter.   Chase Picket, MD 09/16/19 [phone]

## 2019-09-16 NOTE — ED Provider Notes (Addendum)
[facility] EMERGENCY DEPARTMENT Provider Note   CSN: [id number] Arrival date & time: 09/16/19  1516     History Chief Complaint  Patient presents with  . COVID/dvt  . Leg Swelling    Annette [name] is a 28 y.o. female.  HPI     28 year old female with history of diabetes comes in a chief complaint of leg swelling.  She went to the urgent care with complaints of leg pain and sent to the ER for DVT rule out.  Additionally patient has been having some chest discomfort, shortness of breath and URI-like symptoms and low grade fever for which COVID-19 test was sent and it is positive.  Pt has no hx of PE, DVT and denies any exogenous hormone (testosterone / estrogen) use, long distance travels or surgery in the past 6 weeks, active cancer, recent immobilization.  She denies substance abuse and reports that her mother has had DVTs.  Past Medical History:  Diagnosis Date  . Ankle fracture   . Diabetes mellitus without complication (HCC)     There are no problems to display for this patient.   No past surgical history on file.   OB History   No obstetric history on file.     Family History  Problem Relation Age of Onset  . Diabetes Mother   . Diabetes Other     Social History   Tobacco Use  . Smoking status: Never Smoker  . Smokeless tobacco: Never Used  Substance Use Topics  . Alcohol use: Not on file  . Drug use: Not on file    Home Medications Prior to Admission medications   Medication Sig Start Date End Date Taking? Authorizing Provider  insulin glargine (LANTUS) 100 UNIT/ML injection Inject into the skin daily.    [provider]    Allergies    Patient has no known allergies.  Review of Systems   Review of Systems  Constitutional: Positive for activity change.  Respiratory: Positive for shortness of breath.   Cardiovascular: Positive for chest pain.  Neurological: Negative for light-headedness.  All other systems  reviewed and are negative.   Physical Exam Updated Vital Signs BP (!) 127/94   Pulse (!) 113   Temp 99.7 F (37.6 C) (Oral)   Resp 13   LMP 08/17/2019   SpO2 99%   Physical Exam Vitals and nursing note reviewed.  Constitutional:      Appearance: She is well-developed.  HENT:     Head: Normocephalic and atraumatic.  Cardiovascular:     Rate and Rhythm: Normal rate.  Pulmonary:     Effort: Pulmonary effort is normal.  Abdominal:     General: Bowel sounds are normal.  Musculoskeletal:     Cervical back: Normal range of motion and neck supple.  Skin:    General: Skin is warm and dry.  Neurological:     Mental Status: She is alert and oriented to person, place, and time.     ED Results / Procedures / Treatments   Labs (all labs ordered are listed, but only abnormal results are displayed) Labs Reviewed  BASIC METABOLIC PANEL - Abnormal; Notable for the following components:      Result Value   CO2 18 (*)    Glucose, Bld 274 (*)    All other components within normal limits  CBC - Abnormal; Notable for the following components:   RBC 5.67 (*)    Hemoglobin 16.0 (*)    HCT 50.2 (*)  All other components within normal limits  I-STAT BETA HCG BLOOD, ED ([facility], [facility], [facility] ONLY)  TROPONIN I (HIGH SENSITIVITY)  TROPONIN I (HIGH SENSITIVITY)    EKG EKG Interpretation  Date/Time:  Monday September 16 2019 15:26:01 EST Ventricular Rate:  136 PR Interval:  136 QRS Duration: 68 QT Interval:  292 QTC Calculation: 439 R Axis:   96 Text Interpretation: Sinus tachycardia Right atrial enlargement Rightward axis Borderline ECG No old tracing to compare Confirmed by Derwood Kaplan [phone]) on 09/16/2019 6:34:56 PM   Radiology CT Angio Chest PE W and/or Wo Contrast  Result Date: 09/16/2019 CLINICAL DATA:  Shortness of breath.  Right calf pain and swelling EXAM: CT ANGIOGRAPHY CHEST WITH CONTRAST TECHNIQUE: Multidetector CT imaging of the chest was performed using the standard  protocol during bolus administration of intravenous contrast. Multiplanar CT image reconstructions and MIPs were obtained to evaluate the vascular anatomy. CONTRAST:  56mL OMNIPAQUE IOHEXOL 350 MG/ML SOLN COMPARISON:  None. FINDINGS: Cardiovascular: Contrast injection is sufficient to demonstrate satisfactory opacification of the pulmonary arteries to the segmental level.There is an acute pulmonary embolus involving the main right pulmonary artery extending into the right upper lobe pulmonary artery as well as the right lower lobe pulmonary artery. There are segmental and subsegmental pulmonary emboli involving the bilateral lower lobes. There is no CT evidence of right heart strain with an RV LV ratio measuring approximately 0.73. The heart size is normal. There is no significant pericardial effusion. Mediastinum/Nodes: --No mediastinal or hilar lymphadenopathy. --No axillary lymphadenopathy. --No supraclavicular lymphadenopathy. --Normal thyroid gland. --The esophagus is unremarkable Lungs/Pleura: Scattered ground-glass airspace opacities throughout all lobes, right worse than left. There is a trace right-sided pleural effusion. There is no pneumothorax. The trachea is unremarkable. Upper Abdomen: No acute abnormality. Musculoskeletal: No chest wall abnormality. No acute or significant osseous findings. Review of the MIP images confirms the above findings. IMPRESSION: 1. Study is positive for acute pulmonary emboli involving the main right pulmonary artery with extension into the lobar branches of the right upper and right lower lobes. There are additional bilateral segmental and subsegmental pulmonary emboli. There is no CT evidence for right-sided heart strain. 2. Scattered ground-glass airspace opacities bilaterally. Differential considerations include developing pulmonary infarcts in the setting of pulmonary emboli versus an atypical infectious process such as viral pneumonia. 3. Trace right-sided pleural  effusion. These results were called by telephone at the time of interpretation on 09/16/2019 at 10:30 pm to provider [name] , who verbally acknowledged these results. Electronically Signed   By: Katherine Mantle M.D.   On: 09/16/2019 22:31   VAS [place] LOWER EXTREMITY VENOUS (DVT) (ONLY [facility] & [facility])  Result Date: 09/16/2019  Lower Venous Study Indications: Patient presents with right leg pain and swelling for two days and tested positive for Covid-19.  Comparison Study: No priors. Performing Technologist: Marilynne Halsted RDMS, RVT  Examination Guidelines: A complete evaluation includes B-mode imaging, spectral Doppler, color Doppler, and power Doppler as needed of all accessible portions of each vessel. Bilateral testing is considered an integral part of a complete examination. Limited examinations for reoccurring indications may be performed as noted.  +---------+---------------+---------+-----------+----------+--------------+ RIGHT    CompressibilityPhasicitySpontaneityPropertiesThrombus Aging +---------+---------------+---------+-----------+----------+--------------+ CFV      Full           Yes      Yes                                 +---------+---------------+---------+-----------+----------+--------------+  SFJ      Full                                                        +---------+---------------+---------+-----------+----------+--------------+ FV Prox  Full                                                        +---------+---------------+---------+-----------+----------+--------------+ FV Mid   Full                                                        +---------+---------------+---------+-----------+----------+--------------+ FV DistalNone           No       No                   Acute          +---------+---------------+---------+-----------+----------+--------------+ PFV      Full                                                         +---------+---------------+---------+-----------+----------+--------------+ POP      None           No       No                   Acute          +---------+---------------+---------+-----------+----------+--------------+ PTV      None                                         Acute          +---------+---------------+---------+-----------+----------+--------------+ PERO     None                                         Acute          +---------+---------------+---------+-----------+----------+--------------+ Gastroc  None                                         Acute          +---------+---------------+---------+-----------+----------+--------------+   +----+---------------+---------+-----------+----------+--------------+ LEFTCompressibilityPhasicitySpontaneityPropertiesThrombus Aging +----+---------------+---------+-----------+----------+--------------+ CFV Full           Yes      Yes                                 +----+---------------+---------+-----------+----------+--------------+ SFJ Full                                                        +----+---------------+---------+-----------+----------+--------------+  Summary: Right: Findings consistent with acute deep vein thrombosis involving the right femoral vein, right popliteal vein, right posterior tibial veins, right peroneal veins, and right gastrocnemius veins. Left: No evidence of common femoral vein obstruction.  *See table(s) above for measurements and observations.    Preliminary     Procedures Angiocath insertion  Date/Time: 09/16/2019 8:45 PM Performed by: Varney Biles, MD Authorized by: Varney Biles, MD  Consent: Verbal consent obtained. Consent given by: patient Patient understanding: patient states understanding of the procedure being performed Patient identity confirmed: arm band Local anesthesia used: no  Anesthesia: Local anesthesia used: no  Sedation: Patient sedated:  no  Patient tolerance: patient tolerated the procedure well with no immediate complications Comments: 18 G and left AC  .Critical Care Performed by: Varney Biles, MD Authorized by: Varney Biles, MD   Critical care provider statement:    Critical care time (minutes):  38   Critical care was necessary to treat or prevent imminent or life-threatening deterioration of the following conditions:  Respiratory failure   Critical care was time spent personally by me on the following activities:  Discussions with consultants, evaluation of patient's response to treatment, examination of patient, ordering and performing treatments and interventions, ordering and review of laboratory studies, ordering and review of radiographic studies, pulse oximetry, re-evaluation of patient's condition, obtaining history from patient or surrogate and review of old charts Angiocath insertion  Date/Time: 09/16/2019 10:45 PM Performed by: Varney Biles, MD Authorized by: Varney Biles, MD  Consent: Verbal consent obtained. Risks and benefits: risks, benefits and alternatives were discussed Consent given by: patient Patient identity confirmed: arm band Local anesthesia used: no  Anesthesia: Local anesthesia used: no  Sedation: Patient sedated: no     (including critical care time)  Medications Ordered in ED Medications  sodium chloride flush (NS) 0.9 % injection 3 mL (3 mLs Intravenous Given 09/16/19 2146)  oxyCODONE-acetaminophen (PERCOCET/ROXICET) 5-325 MG per tablet 1 tablet (1 tablet Oral Given 09/16/19 2146)  iohexol (OMNIPAQUE) 350 MG/ML injection 75 mL (75 mLs Intravenous Contrast Given 09/16/19 2216)    ED Course  I have reviewed the triage vital signs and the nursing notes.  Pertinent labs & imaging results that were available during my care of the patient were reviewed by me and considered in my medical decision making (see chart for details).  Clinical Course as of Sep 15 2245    Mon Sep 16, 2019  2244 CT PE is positive for COVID-19. Given the large DVT in CT evidence of PE, we will admit her.   [AN]    Clinical Course User Index [AN] Varney Biles, MD   MDM Rules/Calculators/A&P                      28 year old female comes in with chief complaint of leg swelling.  She is Covid positive with some URI-like symptoms and fever.  Leg pain started 2 days ago. Chest pain has been present for 1 week.  Positive subjective fevers for 1 week as well.  Patient is tachycardic.  EKG showing right-sided strain. CT PE ordered.   Namira Rosekrans was evaluated in Emergency Department on 09/16/2019 for the symptoms described in the history of present illness. She was evaluated in the context of the global COVID-19 pandemic, which necessitated consideration that the patient might be at risk for infection with the SARS-CoV-2 virus that causes COVID-19. Institutional protocols and algorithms that pertain to the evaluation of patients at risk for COVID-19 are  in a state of rapid change based on information released by regulatory bodies including the CDC and federal and state organizations. These policies and algorithms were followed during the patient's care in the ED.   Final Clinical Impression(s) / ED Diagnoses Final diagnoses:  Acute deep vein thrombosis (DVT) of proximal vein of right lower extremity (HCC)  COVID-19  Acute pulmonary embolism without acute cor pulmonale, unspecified pulmonary embolism type [facility](HCC)    Rx / DC Orders ED Discharge Orders    None         Derwood KaplanNanavati, Rajan Burgard, MD 09/16/19 2246

## 2019-09-17 DIAGNOSIS — I2699 Other pulmonary embolism without acute cor pulmonale: Secondary | ICD-10-CM

## 2019-09-17 DIAGNOSIS — I824Y1 Acute embolism and thrombosis of unspecified deep veins of right proximal lower extremity: Secondary | ICD-10-CM | POA: Insufficient documentation

## 2019-09-17 DIAGNOSIS — U071 COVID-19: Secondary | ICD-10-CM | POA: Insufficient documentation

## 2019-09-17 LAB — COMPREHENSIVE METABOLIC PANEL
ALT: 13 U/L (ref 0–44)
AST: 14 U/L — ABNORMAL LOW (ref 15–41)
Albumin: 2.8 g/dL — ABNORMAL LOW (ref 3.5–5.0)
Alkaline Phosphatase: 68 U/L (ref 38–126)
Anion gap: 12 (ref 5–15)
BUN: 10 mg/dL (ref 6–20)
CO2: 20 mmol/L — ABNORMAL LOW (ref 22–32)
Calcium: 8.3 mg/dL — ABNORMAL LOW (ref 8.9–10.3)
Chloride: 103 mmol/L (ref 98–111)
Creatinine, Ser: 0.7 mg/dL (ref 0.44–1.00)
GFR calc Af Amer: >60 mL/min (ref >60)
GFR calc non Af Amer: >60 mL/min (ref >60)
Glucose, Bld: 357 mg/dL — ABNORMAL HIGH (ref 70–99)
Potassium: 4 mmol/L (ref 3.5–5.1)
Sodium: 135 mmol/L (ref 135–145)
Total Bilirubin: 0.4 mg/dL (ref 0.3–1.2)
Total Protein: 6.9 g/dL (ref 6.5–8.1)

## 2019-09-17 LAB — HIV ANTIBODY (ROUTINE TESTING W REFLEX): HIV Screen 4th Generation wRfx: NONREACTIVE

## 2019-09-17 LAB — CBG MONITORING, ED
Glucose-Capillary: 295 mg/dL — ABNORMAL HIGH (ref 70–99)
Glucose-Capillary: 299 mg/dL — ABNORMAL HIGH (ref 70–99)
Glucose-Capillary: 311 mg/dL — ABNORMAL HIGH (ref 70–99)
Glucose-Capillary: 344 mg/dL — ABNORMAL HIGH (ref 70–99)

## 2019-09-17 LAB — CBC
HCT: 41.8 % (ref 36.0–46.0)
Hemoglobin: 13.9 g/dL (ref 12.0–15.0)
MCH: 28.3 pg (ref 26.0–34.0)
MCHC: 33.3 g/dL (ref 30.0–36.0)
MCV: 85 fL (ref 80.0–100.0)
Platelets: 174 10*3/uL (ref 150–400)
RBC: 4.92 MIL/uL (ref 3.87–5.11)
RDW: 11.7 % (ref 11.5–15.5)
WBC: 4.1 10*3/uL (ref 4.0–10.5)
nRBC: 0 % (ref 0.0–0.2)

## 2019-09-17 LAB — HEPARIN LEVEL (UNFRACTIONATED): Heparin Unfractionated: 0.81 IU/mL — ABNORMAL HIGH (ref 0.30–0.70)

## 2019-09-17 LAB — HEMOGLOBIN A1C
Hgb A1c MFr Bld: 12.9 % — ABNORMAL HIGH (ref 4.8–5.6)
Mean Plasma Glucose: 323.53 mg/dL

## 2019-09-17 MED ORDER — INSULIN GLARGINE 100 UNIT/ML ~~LOC~~ SOLN
20.0000 [IU] | Freq: Every day | SUBCUTANEOUS | Status: DC
  Administered 2019-09-17: 20 [IU] via SUBCUTANEOUS
  Filled 2019-09-17: qty 0.2

## 2019-09-17 MED ORDER — APIXABAN 5 MG PO TABS
10.0000 mg | ORAL_TABLET | Freq: Two times a day (BID) | ORAL | Status: DC
  Administered 2019-09-17: 10 mg via ORAL
  Filled 2019-09-17 (×2): qty 2

## 2019-09-17 MED ORDER — APIXABAN 5 MG PO TABS: 5.0000 mg | ORAL_TABLET | Freq: Two times a day (BID) | ORAL | Status: DC

## 2019-09-17 MED ORDER — APIXABAN 5 MG PO TABS: ORAL_TABLET | ORAL | 0 refills | Status: DC

## 2019-09-17 MED FILL — ELIQUIS STARTER PACK 5 MG T: 5 | 30 days supply | Qty: 74 | Fill #0

## 2019-09-17 NOTE — Progress Notes (Signed)
Inpatient Diabetes Program Recommendations  AACE/ADA: New Consensus Statement on Inpatient Glycemic Control (2015)  Target Ranges:  Prepandial:   less than 140 mg/dL      Peak postprandial:   less than 180 mg/dL (1-2 hours)      Critically ill patients:  140 - 180 mg/dL   Lab Results  Component Value Date   GLUCAP 299 (H) 09/17/2019   HGBA1C 12.9 (H) 09/17/2019      Diabetes history: DM2 Outpatient Diabetes medications: Lantus 20 units daily Current orders for Inpatient glycemic control: Novolog 0-9 units TID and 0-5 HS; Lantus 20 units daily   Note: Spoke with patient on the phone.  Reviewed patient's current A1c of 12.9%.  Explained what a A1c is and what it measures. Also reviewed goal A1c with patient, importance of good glucose control @ home, and blood sugar goals.  Patient states she recently moved here and does not have a PCP.  She states she is going to follow up with a local clinic here as she does not have insurance.  Denies difficulties obtaining Lantus.  Patient currently does not have a meter; she states she can afford one.  Provided patient with a meter as she is COVID positive.   Encouraged her to check her BS at least 3 times a day.  She states she drinks a lot of sweet tea.  Counseled on changing to diet drinks or un-sweet tea and limiting carbohydrates to assist in decreasing blood glucose levels.  Reviewed hypoglycemia and treatment.    Thank you, Geoffry Paradise, RN, BSN Diabetes Coordinator Inpatient Diabetes Program [phone] (team pager from 8a-5p)

## 2019-09-17 NOTE — Discharge Instructions (Addendum)
Thank you for allowing [place] to be part of your care.  You were admitted for a pulmonary embolism, this is a blood clot in your lungs.  You also had blood clots throughout your legs.  We have started you on a new blood thinner which is called Eliquis. You will be on 10mg  twice daily for 7 days, followed by 5mg  twice daily. You will need to continue taking this until your primary care doctor says you can stop.  You may need to be on this lifelong.  It will be very important for you to establish care with a primary care doctor and follow-up closely with them.  Return to care if you have worsening swelling in your legs, worsening leg pain, new swelling in your left leg, worsening chest pain, worsening shortness of breath, inability to breathe when lying flat.  I do recommend you also follow-up with cardiology at some point for your elevated heart rate.  Since you also have coronavirus I recommend you try and quarantine has much as possible to avoid exposure to others.  Make sure you wash your hands carefully and limit going out in public.  If you have any shortness of breath or worsening chest pain please go to the emergency department.  If you have palpitations please go to the emergency department.  Please make sure to check your sugars closely, if they are elevated please call your primary care doctor.  Follow-up with your primary care doctor within 1 week of discharge  We set up a hospital follow up on 12/17 with Dr. Criss Rosales at the [place] center. This is not to establish care. If you wish to establish care you need to let them know at that visit. THIS IS A VIRTUAL APPOINTMENT. The physician will call you on your smart phone and is able to do a video visit with you. If you do not have access to a smart phone let them know and it can be done through the telephone.  -------------------------------------------------------------------------------------------------   Pulmonary Embolism  A  pulmonary embolism (PE) is a sudden blockage or decrease of blood flow in one or both lungs. Most blockages come from a blood clot that forms in the vein of a lower leg, thigh, or arm (deep vein thrombosis, DVT) and travels to the lungs. A clot is blood that has thickened into a gel or solid. PE is a dangerous and life-threatening condition that needs to be treated right away. What are the causes? This condition is usually caused by a blood clot that forms in a vein and moves to the lungs. In rare cases, it may be caused by air, fat, part of a tumor, or other tissue that moves through the veins and into the lungs. What increases the risk? The following factors may make you more likely to develop this condition:  Experiencing a traumatic injury, such as breaking a hip or leg.  Having: ? A spinal cord injury. ? Orthopedic surgery, especially hip or knee replacement. ? Any major surgery. ? A stroke. ? DVT. ? Blood clots or blood clotting disease. ? Long-term (chronic) lung or heart disease. ? Cancer treated with chemotherapy. ? A central venous catheter.  Taking medicines that contain estrogen. These include birth control pills and hormone replacement therapy.  Being: ? Pregnant. ? In the period of time after your baby is delivered (postpartum). ? Older than age 3. ? Overweight. ? A smoker, especially if you have other risks. What are the signs or symptoms?  Symptoms of this condition usually start suddenly and include:  Shortness of breath during activity or at rest.  Coughing, coughing up blood, or coughing up blood-tinged mucus.  Chest pain that is often worse with deep breaths.  Rapid or irregular heartbeat.  Feeling light-headed or dizzy.  Fainting.  Feeling anxious.  Fever.  Sweating.  Pain and swelling in a leg. This is a symptom of DVT, which can lead to PE. How is this diagnosed? This condition may be diagnosed based on:  Your medical history.  A physical  exam.  Blood tests.  CT pulmonary angiogram. This test checks blood flow in and around your lungs.  Ventilation-perfusion scan, also called a lung VQ scan. This test measures air flow and blood flow to the lungs.  An ultrasound of the legs. How is this treated? Treatment for this condition depends on many factors, such as the cause of your PE, your risk for bleeding or developing more clots, and other medical conditions you have. Treatment aims to remove, dissolve, or stop blood clots from forming or growing larger. Treatment may include:  Medicines, such as: ? Blood thinning medicines (anticoagulants) to stop clots from forming. ? Medicines that dissolve clots (thrombolytics).  Procedures, such as: ? Using a flexible tube to remove a blood clot (embolectomy) or to deliver medicine to destroy it (catheter-directed thrombolysis). ? Inserting a filter into a large vein that carries blood to the heart (inferior vena cava). This filter (vena cava filter) catches blood clots before they reach the lungs. ? Surgery to remove the clot (surgical embolectomy). This is rare. You may need a combination of immediate, long-term (up to 3 months after diagnosis), and extended (more than 3 months after diagnosis) treatments. Your treatment may continue for several months (maintenance therapy). You and your health care provider will work together to choose the treatment program that is best for you. Follow these instructions at home: Medicines  Take over-the-counter and prescription medicines only as told by your health care provider.  If you are taking an anticoagulant medicine: ? Take the medicine every day at the same time each day. ? Understand what foods and drugs interact with your medicine. ? Understand the side effects of this medicine, including excessive bruising or bleeding. Ask your health care provider or pharmacist about other side effects. General instructions  Wear a medical alert  bracelet or carry a medical alert card that says you have had a PE and lists what medicines you take.  Ask your health care provider when you may return to your normal activities. Avoid sitting or lying for a long time without moving.  Maintain a healthy weight. Ask your health care provider what weight is healthy for you.  Do not use any products that contain nicotine or tobacco, such as cigarettes, e-cigarettes, and chewing tobacco. If you need help quitting, ask your health care provider.  Talk with your health care provider about any travel plans. It is important to make sure that you are still able to take your medicine while on trips.  Keep all follow-up visits as told by your health care provider. This is important. Contact a health care provider if:  You missed a dose of your blood thinner medicine. Get help right away if:  You have: ? New or increased pain, swelling, warmth, or redness in an arm or leg. ? Numbness or tingling in an arm or leg. ? Shortness of breath during activity or at rest. ? A fever. ? Chest pain. ?  A rapid or irregular heartbeat. ? A severe headache. ? Vision changes. ? A serious fall or accident, or you hit your head. ? Stomach (abdominal) pain. ? Blood in your vomit, stool, or urine. ? A cut that will not stop bleeding.  You cough up blood.  You feel light-headed or dizzy.  You cannot move your arms or legs.  You are confused or have memory loss. These symptoms may represent a serious problem that is an emergency. Do not wait to see if the symptoms will go away. Get medical help right away. Call your local emergency services (911 in the [place] Do not drive yourself to the hospital. Summary  A pulmonary embolism (PE) is a sudden blockage or decrease of blood flow in one or both lungs. PE is a dangerous and life-threatening condition that needs to be treated right away.  Treatments for this condition usually include medicines to thin your blood  (anticoagulants) or medicines to break apart blood clots (thrombolytics).  If you are given blood thinners, it is important to take the medicine every day at the same time each day.  Understand what foods and drugs interact with any medicines that you are taking.  If you have signs of PE or DVT, call your local emergency services (911 in the [place] This information is not intended to replace advice given to you by your health care provider. Make sure you discuss any questions you have with your health care provider. Document Released: 09/16/2000 Document Revised: 06/27/2018 Document Reviewed: 06/27/2018 Elsevier Patient Education  2020 [employer].   ------------------------------------------------------------------------------------------------ Information on my medicine - ELIQUIS (apixaban)  This medication education was reviewed with me or my healthcare representative as part of my discharge preparation.  The pharmacist that spoke with me during my hospital stay was:  Leander Rams, [facility]  Why was Eliquis prescribed for you? Eliquis was prescribed to treat blood clots that may have been found in the veins of your legs (deep vein thrombosis) or in your lungs (pulmonary embolism) and to reduce the risk of them occurring again.  What do You need to know about Eliquis ? The starting dose is 10 mg (two 5 mg tablets) taken TWICE daily for the FIRST SEVEN (7) DAYS, then on   12/22  the dose is reduced to ONE 5 mg tablet taken TWICE daily.  Eliquis may be taken with or without food.   Try to take the dose about the same time in the morning and in the evening. If you have difficulty swallowing the tablet whole please discuss with your pharmacist how to take the medication safely.  Take Eliquis exactly as prescribed and DO NOT stop taking Eliquis without talking to the doctor who prescribed the medication.  Stopping may increase your risk of developing a new blood clot.  Refill your  prescription before you run out.  After discharge, you should have regular check-up appointments with your healthcare provider that is prescribing your Eliquis.    What do you do if you miss a dose? If a dose of ELIQUIS is not taken at the scheduled time, take it as soon as possible on the same day and twice-daily administration should be resumed. The dose should not be doubled to make up for a missed dose.  Important Safety Information A possible side effect of Eliquis is bleeding. You should call your healthcare provider right away if you experience any of the following: ? Bleeding from an injury or your nose that does not  stop. ? Unusual colored urine (red or dark brown) or unusual colored stools (red or black). ? Unusual bruising for unknown reasons. ? A serious fall or if you hit your head (even if there is no bleeding).  Some medicines may interact with Eliquis and might increase your risk of bleeding or clotting while on Eliquis. To help avoid this, consult your healthcare provider or pharmacist prior to using any new prescription or non-prescription medications, including herbals, vitamins, non-steroidal anti-inflammatory drugs (NSAIDs) and supplements.  This website has more information on Eliquis (apixaban): http://www.eliquis.com/eliquis/home

## 2019-09-17 NOTE — ED Notes (Signed)
Pt stated "feeling better than yesterday"

## 2019-09-17 NOTE — ED Notes (Signed)
Lunch Tray Ordered @ 1126.  

## 2019-09-17 NOTE — Discharge Summary (Signed)
Family Medicine Teaching [facility] Discharge Summary  Patient name: Annette [name] Medical record number: [id number] Date of birth: [birth year] Age: 28 y.o. Gender: female Date of Admission: 09/16/2019  Date of Discharge: 09/17/2019 Admitting Physician: Nestor Ramp, MD  Primary Care Provider: Patient, No Pcp Per Consultants: None  Indication for Hospitalization: PE/DVT  Discharge Diagnoses/Problem List:  Chest pain/calf pain/tachycardia/DVT/PE COVID positive/bilateral atypical pneumonia  Diabetes  Exposure to STI   Disposition: home  Discharge Condition: stable  Discharge Exam:   General: Alert, cooperative, no acute distress Cardio: Normal S1 and S2, RRR, mildly tachycardic,no murmurs or rubs.   Pulm: Reduced AE in right lung. Normal AE in left lung. No crackles, wheezing, or diminished breath sounds. Normal respiratory effort Abdomen: Bowel sounds normal. Abdomen soft and non-tender.  Extremities: No peripheral edema. Warm/ well perfused.  Strong radial pulse Neuro: Cranial nerves grossly intact  Brief Hospital Course:  Annette [name] is a 28 y.o. female presenting with chest pain found to be 2/2 to PE, as well as diffuse DVTs as well as COVID positive.   PE/DVT Patient presented to urgent care with leg pain and was sent for ED for DVT rule out.  No history of recent surgery, travel, and birth control use.  Ultrasound showed acute DVT involving the right femoral vein, right popliteal vein, right posterior tibial vein, right peroneal vein, right gastrocnemius vein.  No DVT in the left lower extremity.  TA was a positive for acute PE involving the right main pulmonary artery with extension into the lobar branches of the right upper and right lower lobes. Heparin gtt started and transitioned to PO eliquis. Instructed to take 10mg  bid x7d and transition to 5mg  bid. This was discussed with pharmacy. This is considered a provoked DVT 2/2 COVID.   COVID positive with bilateral  atypical pneumonia  CTA also showed scattered groundglass airspace opacities bilaterally concerning for atypical infection. No O2 requirement throughout hospitalization. COVID test positive and patient had exposure with co-workers. Advised to continue quarantine   Diabetes  Patient found to be hyperglycemic throughout hospitalization.  A1c was 12.9 showing likely poor control.  All medications included Lantus 20 units.  Advised that patient follow-up with PCP to improve glycemic control as her A1c shows she is likely running hyperglycemic.  Exposure to Gonorrhea/chlamydia Patient with exposure to gonorrhea and chlamydia.      Issues for Follow Up:  1. Started on Apixaban 10mg  bid x 7days, then 5mg  BID after. Ensure compliance 1. Anticoagulation for 3-6 mo 2/2 provoked PE/DVT 2. Absolute contraindication to estrogen containing birth control  2. COVID positive 3. Monitor CBGs, elevated A1C so will likely need improved diabetes regimen 4. Ensure treatment of GC/chlamydia and that partners are treated as well  5. Medications were sent to [facility] and given to patient prior to dc   Significant Procedures:  None  Significant Labs and Imaging:  Recent Labs  Lab 09/16/19 1812 09/17/19 0600  WBC 4.3 4.1  HGB 16.0* 13.9  HCT 50.2* 41.8  PLT 178 174   Recent Labs  Lab 09/16/19 1812 09/17/19 0600  NA 136 135  K 4.3 4.0  CL 104 103  CO2 18* 20*  GLUCOSE 274* 357*  BUN 10 10  CREATININE 0.65 0.70  CALCIUM 8.9 8.3*  ALKPHOS  --  68  AST  --  14*  ALT  --  13  ALBUMIN  --  2.8*    CT Angio Chest PE W and/or Wo Contrast  Result Date:  09/16/2019 CLINICAL DATA:  Shortness of breath.  Right calf pain and swelling EXAM: CT ANGIOGRAPHY CHEST WITH CONTRAST TECHNIQUE: Multidetector CT imaging of the chest was performed using the standard protocol during bolus administration of intravenous contrast. Multiplanar CT image reconstructions and MIPs were obtained to evaluate the vascular anatomy.  CONTRAST:  75mL OMNIPAQUE IOHEXOL 350 MG/ML SOLN COMPARISON:  None. FINDINGS: Cardiovascular: Contrast injection is sufficient to demonstrate satisfactory opacification of the pulmonary arteries to the segmental level.There is an acute pulmonary embolus involving the main right pulmonary artery extending into the right upper lobe pulmonary artery as well as the right lower lobe pulmonary artery. There are segmental and subsegmental pulmonary emboli involving the bilateral lower lobes. There is no CT evidence of right heart strain with an RV LV ratio measuring approximately 0.73. The heart size is normal. There is no significant pericardial effusion. Mediastinum/Nodes: --No mediastinal or hilar lymphadenopathy. --No axillary lymphadenopathy. --No supraclavicular lymphadenopathy. --Normal thyroid gland. --The esophagus is unremarkable Lungs/Pleura: Scattered ground-glass airspace opacities throughout all lobes, right worse than left. There is a trace right-sided pleural effusion. There is no pneumothorax. The trachea is unremarkable. Upper Abdomen: No acute abnormality. Musculoskeletal: No chest wall abnormality. No acute or significant osseous findings. Review of the MIP images confirms the above findings. IMPRESSION: 1. Study is positive for acute pulmonary emboli involving the main right pulmonary artery with extension into the lobar branches of the right upper and right lower lobes. There are additional bilateral segmental and subsegmental pulmonary emboli. There is no CT evidence for right-sided heart strain. 2. Scattered ground-glass airspace opacities bilaterally. Differential considerations include developing pulmonary infarcts in the setting of pulmonary emboli versus an atypical infectious process such as viral pneumonia. 3. Trace right-sided pleural effusion. These results were called by telephone at the time of interpretation on 09/16/2019 at 10:30 pm to provider [facility] , who verbally acknowledged  these results. Electronically Signed   By: Katherine Mantlehristopher  Green M.D.   On: 09/16/2019 22:31   VAS US LOWER EXTREMITY VENOUS (DVT) (ONLY [facility] & [facility])  Result Date: 09/17/2019  Lower Venous Study Indications: Patient presents with right leg pain and swelling for two days and tested positive for Covid-19.  Comparison Study: No priors. Performing Technologist: Marilynne Halstedita Sturdivant RDMS, RVT  Examination Guidelines: A complete evaluation includes B-mode imaging, spectral Doppler, color Doppler, and power Doppler as needed of all accessible portions of each vessel. Bilateral testing is considered an integral part of a complete examination. Limited examinations for reoccurring indications may be performed as noted.  +---------+---------------+---------+-----------+----------+--------------+ RIGHT    CompressibilityPhasicitySpontaneityPropertiesThrombus Aging +---------+---------------+---------+-----------+----------+--------------+ CFV      Full           Yes      Yes                                 +---------+---------------+---------+-----------+----------+--------------+ SFJ      Full                                                        +---------+---------------+---------+-----------+----------+--------------+ FV Prox  Full                                                        +---------+---------------+---------+-----------+----------+--------------+  FV Mid   Full                                                        +---------+---------------+---------+-----------+----------+--------------+ FV DistalNone           No       No                   Acute          +---------+---------------+---------+-----------+----------+--------------+ PFV      Full                                                        +---------+---------------+---------+-----------+----------+--------------+ POP      None           No       No                   Acute           +---------+---------------+---------+-----------+----------+--------------+ PTV      None                                         Acute          +---------+---------------+---------+-----------+----------+--------------+ PERO     None                                         Acute          +---------+---------------+---------+-----------+----------+--------------+ Gastroc  None                                         Acute          +---------+---------------+---------+-----------+----------+--------------+   +----+---------------+---------+-----------+----------+--------------+ LEFTCompressibilityPhasicitySpontaneityPropertiesThrombus Aging +----+---------------+---------+-----------+----------+--------------+ CFV Full           Yes      Yes                                 +----+---------------+---------+-----------+----------+--------------+ SFJ Full                                                        +----+---------------+---------+-----------+----------+--------------+     Summary: Right: Findings consistent with acute deep vein thrombosis involving the right femoral vein, right popliteal vein, right posterior tibial veins, right peroneal veins, and right gastrocnemius veins. Left: No evidence of common femoral vein obstruction.  *See table(s) above for measurements and observations. Electronically signed by Harold Barban MD on 09/17/2019 at 8:27:39 AM.    Final      Results/Tests Pending at Time of Discharge:  Unresulted Labs (From admission, onward)    Start  Ordered   09/18/19 0500  Heparin level (unfractionated)  Daily,   R     09/16/19 2252   09/17/19 1300  Heparin level  Once-Timed,   STAT     09/17/19 0642   09/17/19 0500  CBC  Daily,   R     09/16/19 2252           Discharge Medications:  Allergies as of 09/17/2019   No Known Allergies     Medication List    TAKE these medications   apixaban 5 MG Tabs tablet Commonly known as:  Eliquis Take 2 tablets ( ) twice daily for 7 days, then 1 tablet ( ) twice daily   insulin glargine 100 UNIT/ML injection Commonly known as: LANTUS Inject 20 Units into the skin daily.            Durable Medical Equipment  (From admission, onward)         Start     Ordered   09/17/19 1116  For home use only DME Walker  Once    Question:  Patient needs a walker to treat with the following condition  Answer:  COVID-19 virus detected   09/17/19 1116          Discharge Instructions: Please refer to Patient Instructions section of EMR for full details.  Patient was counseled important signs and symptoms that should prompt return to medical care, changes in medications, dietary instructions, activity restrictions, and follow up appointments.   Follow-Up Appointments: Follow-up Information    Marthenia Rolling, DO. Go on 09/19/2019.   Specialty: Family Medicine Why: :50PM (please arrive 15 min early); if you have cough, SOB, or fever please CALL prior to arriving to clinic Contact information: [address] [place] [address] [phone]           Towanda Octave, MD 09/17/2019, 11:37 AM PGY-1, [facility]

## 2019-09-17 NOTE — Progress Notes (Signed)
ANTICOAGULATION CONSULT NOTE - Follow Up Consult  Pharmacy Consult for heparin Indication: PE/DVT  Labs: Recent Labs    09/16/19 1812 09/16/19 2147 09/17/19 0600  HGB 16.0*  --  13.9  HCT 50.2*  --  41.8  PLT 178  --  174  HEPARINUNFRC  --   --  0.81*  CREATININE 0.65  --  0.70  TROPONINIHS 2 3  --     Assessment: 73 yof presenting COVID-19 positive with acute RLE DVT, PE with no RHS. Was started on heparin infusion - not on anticoagulation PTA. Baseline Hg 16, plt 178 on admit. No active bleed issues documented.   Hgb 13.9, plt 174. No s/sx of bleeding. Plan to change to apixaban. Scr stable at 0.7 (CrCl >100 mL/min).   Goal of Therapy:  Heparin level 0.3-0.7 units/ml   Plan:  Discontinue heparin infusion Start apixaban 10 mg BID for 7 days then 5 mg BID thereafter Monitor CBC and for s/sx of bleeding     Antonietta Jewel, PharmD, BCCCP Clinical Pharmacist  Phone: [phone]  Please check AMION for all [place] phone numbers After 10:00 PM, call [place] [phone] 09/17/2019,8:56 AM

## 2019-09-17 NOTE — Progress Notes (Addendum)
Reviewing chart and noted patient cbg 344 at last check. Noted 4U aspart given per night time coverage. Will go ahead and give lantus now at full 20U home dose. A1C ~13 so significantly uncontrolled, can likely tolerate more than 20U but will titrate up as necessary if needs to stay past 12/15. Rest of plan per signed H&P.  Guadalupe Dawn MD PGY-3 Family Medicine Resident

## 2019-09-17 NOTE — Evaluation (Signed)
Physical Therapy Evaluation Patient Details Name: Tamitha Norell MRN: [id number] DOB: Oct 11, 1990 Today's Date: 09/17/2019   History of Present Illness  Jesly Hartmann is a 28 y.o. female presenting with leg pain and chest discomfort. PMH is significant for diabetes.  Found to have R LE DVT and bilateral pneumonia with positive Covid test.  Clinical Impression  Patient presents with decreased mobility due to pain R LE which increases with dependency.  She was able to ambulate better with RW, but may also benefit from compression stocking for R LE.  Currently stable for home with assist of boyfriend without follow up PT.  Encouraged frequent short bouts of mobility for improvement in her lungs and to decrease further risk of DVT.  Patient distracted but verbalized understanding.  Will sign off as stable for home with assist.  Of note no walking SpO2 taken as she was limited with ambulation due to pain in R LE,  Was 100% on RA at rest and no noted SOB with ambulation in room, Did have HR up to 120 at rest and 124 with mobility.     Follow Up Recommendations No PT follow up    Equipment Recommendations  Rolling walker with 5" wheels    Recommendations for Other Services       Precautions / Restrictions Precautions Precautions: Fall      Mobility  Bed Mobility Overal bed mobility: Modified Independent                Transfers Overall transfer level: Modified independent                  Ambulation/Gait Ambulation/Gait assistance: Supervision Gait Distance (Feet): 12 Feet(5' no device) Assistive device: Rolling walker (2 wheeled) Gait Pattern/deviations: Step-to pattern;Decreased stride length;Decreased stance time - right;Decreased step length - left;Antalgic;Trunk flexed     General Gait Details: Initially short distance due to pain R calf and difficulty with weight bearing.  With RW able to walk further but still painful with dependency, educated in use/safety with  walker  Stairs            Wheelchair Mobility    Modified Rankin (Stroke Patients Only)       Balance Overall balance assessment: Needs assistance   Sitting balance-Leahy Scale: Good     Standing balance support: No upper extremity supported Standing balance-Leahy Scale: Fair Standing balance comment: can stand without UE support, but limits weight on R LE                             Pertinent Vitals/Pain Pain Assessment: 0-10 Pain Score: 8  Pain Location: R LE after ambulatiopn Pain Descriptors / Indicators: Aching;Discomfort;Grimacing;Guarding Pain Intervention(s): Monitored during session;Repositioned;Limited activity within patient's tolerance    Home Living Family/patient expects to be discharged to:: Private residence Living Arrangements: Spouse/significant other Available Help at Discharge: Friend(s) Type of Home: Apartment Home Access: Level entry     Home Layout: One level Home Equipment: None      Prior Function Level of Independence: Independent               Hand Dominance        Extremity/Trunk Assessment   Upper Extremity Assessment Upper Extremity Assessment: Overall WFL for tasks assessed    Lower Extremity Assessment Lower Extremity Assessment: RLE deficits/detail RLE Deficits / Details: tight in calf with AROM of ankle WFL, painful with weight bearing       Communication  Communication: No difficulties  Cognition Arousal/Alertness: Awake/alert Behavior During Therapy: WFL for tasks assessed/performed Overall Cognitive Status: Within Functional Limits for tasks assessed                                 General Comments: distracted by multiple phone calls/texting      General Comments General comments (skin integrity, edema, etc.): Educated in frequent short bouts of activity and for safety with mobility due to will be on anticoagulation and more risky if she falls.  Reports her boyfriend will  bring her everything    Exercises     Assessment/Plan    PT Assessment Patent does not need any further PT services  PT Problem List         PT Treatment Interventions      PT Goals (Current goals can be found in the Care Plan section)  Acute Rehab PT Goals PT Goal Formulation: All assessment and education complete, DC therapy    Frequency     Barriers to discharge        Co-evaluation               AM-PAC PT "6 Clicks" Mobility  Outcome Measure Help needed turning from your back to your side while in a flat bed without using bedrails?: None Help needed moving from lying on your back to sitting on the side of a flat bed without using bedrails?: None Help needed moving to and from a bed to a chair (including a wheelchair)?: None Help needed standing up from a chair using your arms (e.g., wheelchair or bedside chair)?: None Help needed to walk in hospital room?: None Help needed climbing 3-5 steps with a railing? : A Little 6 Click Score: 23    End of Session   Activity Tolerance: Patient limited by pain Patient left: in bed;with call bell/phone within reach Nurse Communication: Mobility status PT Visit Diagnosis: Difficulty in walking, not elsewhere classified (R26.2);Pain Pain - Right/Left: Right Pain - part of body: Knee    Time: 0940-1004 PT Time Calculation (min) (ACUTE ONLY): 24 min   Charges:   PT Evaluation $PT Eval Low Complexity: 1 Low PT Treatments $Gait Training: 8-22 mins        Magda Kiel, [place] [facility] [phone] 09/17/2019   Reginia Naas 09/17/2019, 10:37 AM

## 2019-09-17 NOTE — ED Notes (Signed)
With pt's permission,confirmed that pt had a positive covid test with pt's boss Suezanne Jacquet)

## 2019-09-17 NOTE — Discharge Planning (Signed)
Fuller Mandril, RN, BSN, [place] [phone] Pt qualifies for DME rolling walker.  DME  ordered through [facility].  Bethanne Ginger of [facility] notified to deliver to pt room prior to D/C home.

## 2019-09-17 NOTE — ED Notes (Signed)
Pt requesting copy of covid test results - pt given copy of covid test results with approval from Dr. Charolotte Capuchin and Charge RN [facility] [place])

## 2019-09-17 NOTE — Progress Notes (Signed)
ANTICOAGULATION CONSULT NOTE - Follow Up Consult  Pharmacy Consult for heparin Indication: PE/DVT  Labs: Recent Labs    09/16/19 1812 09/16/19 2147 09/17/19 0600  HGB 16.0*  --  13.9  HCT 50.2*  --  41.8  PLT 178  --  174  HEPARINUNFRC  --   --  0.81*  CREATININE 0.65  --   --   TROPONINIHS 2 3  --     Assessment: 28yo female supratherapeutic on heparin with initial dosing for VTE.  Goal of Therapy:  Heparin level 0.3-0.7 units/ml   Plan:  Will decrease heparin gtt by 1-2 units/kg/hr to 1300 units/hr and check level in 6 hours.    Wynona Neat, PharmD, BCPS  09/17/2019,6:42 AM

## 2019-09-17 NOTE — ED Notes (Signed)
Patient verbalizes understanding of discharge instructions. Opportunity for questioning and answers were provided. Pt send home with walker, verbalized understanding of use; Pt discharged from ED.

## 2019-09-19 ENCOUNTER — Telehealth (INDEPENDENT_AMBULATORY_CARE_PROVIDER_SITE_OTHER): Payer: Self-pay | Admitting: Family Medicine

## 2019-09-19 ENCOUNTER — Other Ambulatory Visit: Payer: Self-pay

## 2019-09-23 NOTE — Progress Notes (Signed)
Patient called and did not pick up for telemedicine followup from hospital discharge, they have not established care at this clinic.  -Dr. Criss Rosales

## 2019-09-24 ENCOUNTER — Telehealth: Payer: Medicaid Other

## 2019-10-01 ENCOUNTER — Telehealth (HOSPITAL_COMMUNITY): Payer: Self-pay

## 2020-01-01 ENCOUNTER — Ambulatory Visit: Payer: Medicaid Other

## 2020-03-27 ENCOUNTER — Other Ambulatory Visit: Payer: Self-pay

## 2020-03-27 ENCOUNTER — Ambulatory Visit (HOSPITAL_COMMUNITY)
Admission: EM | Admit: 2020-03-27 | Discharge: 2020-03-27 | Disposition: A | Discharge status: Home / Self Care | Payer: Self-pay | Attending: Emergency Medicine | Admitting: Emergency Medicine

## 2020-03-27 ENCOUNTER — Encounter (HOSPITAL_COMMUNITY): Payer: Self-pay

## 2020-03-27 DIAGNOSIS — N760 Acute vaginitis: Secondary | ICD-10-CM | POA: Insufficient documentation

## 2020-03-27 DIAGNOSIS — R3989 Other symptoms and signs involving the genitourinary system: Secondary | ICD-10-CM | POA: Insufficient documentation

## 2020-03-27 MED ORDER — FLUCONAZOLE 150 MG PO TABS: 150.0000 mg | ORAL_TABLET | Freq: Once | ORAL | 0 refills | Status: DC

## 2020-03-27 MED ORDER — VALACYCLOVIR HCL 1 G PO TABS
1000.0000 mg | ORAL_TABLET | Freq: Three times a day (TID) | ORAL | 0 refills | Status: AC
Start: 2020-03-27 — End: 2020-04-10

## 2020-03-27 MED ORDER — CLOTRIMAZOLE 1 % EX CREA
TOPICAL_CREAM | CUTANEOUS | 0 refills | Status: DC
Start: 2020-03-27 — End: 2021-03-11

## 2020-03-27 MED ORDER — VALACYCLOVIR HCL 1 G PO TABS: 1000.0000 mg | ORAL_TABLET | Freq: Three times a day (TID) | ORAL | 0 refills | Status: DC

## 2020-03-27 MED ORDER — FLUCONAZOLE 150 MG PO TABS: 150.0000 mg | ORAL_TABLET | Freq: Once | ORAL | 0 refills | Status: AC

## 2020-03-27 MED ORDER — CLOTRIMAZOLE 1 % EX CREA: TOPICAL_CREAM | CUTANEOUS | 0 refills | Status: DC

## 2020-03-27 MED ORDER — DOXYCYCLINE HYCLATE 100 MG PO CAPS
100.0000 mg | ORAL_CAPSULE | Freq: Two times a day (BID) | ORAL | 0 refills | Status: AC
Start: 2020-03-27 — End: 2020-04-03

## 2020-03-27 MED ORDER — DOXYCYCLINE HYCLATE 100 MG PO CAPS
100.0000 mg | ORAL_CAPSULE | Freq: Two times a day (BID) | ORAL | 0 refills | Status: DC
Start: 2020-03-27 — End: 2020-03-27

## 2020-03-27 NOTE — ED Triage Notes (Signed)
Pt presents today for vaginal pain r/t cut that occurred during intercourse. Pt states laceration is on right labia, and is now draining puss with a foul odor. Pt endorses subjective fevers, and chills. Pt endorses pain with urination. Pt denies abdominal pain, n/v.

## 2020-03-27 NOTE — ED Provider Notes (Signed)
[facility]    CSN: [id number] Arrival date & time: 03/27/20  1034      History   Chief Complaint Chief Complaint  Patient presents with  . Vaginal Discharge    HPI Annette [name] is a 29 y.o. female history of prior DVT/PE on Eliquis, DM type II, presenting today for evaluation of genital irritation.  Patient reports over the past 3 to 4 days she believes that she was scratched during intercourse by her partner and believes she has a clot.  Since she has developed increased pain discomfort, burning sensation and a foul odor.  She is unsure of any discharge.  She has been applying Vaseline to area to help with discomfort.  Last menstrual cycle was around 5/21.  She is not on any form of birth control.  Denies concern for pregnancy at this time.  HPI  Past Medical History:  Diagnosis Date  . Ankle fracture   . Diabetes mellitus without complication [facility])     Patient Active Problem List   Diagnosis Date Noted  . Acute deep vein thrombosis (DVT) of proximal vein of right lower extremity ([place])   . COVID-19   . Pulmonary embolus ([place]) 09/16/2019    History reviewed. No pertinent surgical history.  OB History   No obstetric history on file.      Home Medications    Prior to Admission medications   Medication Sig Start Date End Date Taking? Authorizing Provider  insulin glargine (LANTUS) 100 UNIT/ML injection Inject 20 Units into the skin daily.    Yes [provider]  apixaban (ELIQUIS) 5 MG TABS tablet Take 2 tablets (10mg ) twice daily for 7 days, then 1 tablet (5mg ) twice daily 09/17/19   Meccariello, Bernita Raisin, DO  clotrimazole (LOTRIMIN) 1 % cream Apply to affected area 2 times daily 03/27/20   Anthonyjames Bargar C, PA-C  doxycycline (VIBRAMYCIN) 100 MG capsule Take 1 capsule (100 mg total) by mouth 2 (two) times daily for 7 days. 03/27/20 04/03/20  Rudolph Dobler C, PA-C  fluconazole (DIFLUCAN) 150 MG tablet Take 1 tablet (150 mg total) by mouth once for  1 dose. 03/27/20 03/27/20  Marshall Roehrich C, PA-C  valACYclovir (VALTREX) 1000 MG tablet Take 1 tablet (1,000 mg total) by mouth 3 (three) times daily for 14 days. 03/27/20 04/10/20  Alverda Nazzaro, Elesa Hacker, PA-C    Family History Family History  Problem Relation Age of Onset  . Diabetes Mother   . Diabetes Other     Social History Social History   Tobacco Use  . Smoking status: Never Smoker  . Smokeless tobacco: Never Used  Substance Use Topics  . Alcohol use: Not on file  . Drug use: Not on file     Allergies   Patient has no known allergies.   Review of Systems Review of Systems  Constitutional: Negative for fever.  Respiratory: Negative for shortness of breath.   Cardiovascular: Negative for chest pain.  Gastrointestinal: Negative for abdominal pain, diarrhea, nausea and vomiting.  Genitourinary: Positive for dysuria and genital sores. Negative for flank pain, hematuria, menstrual problem, vaginal bleeding, vaginal discharge and vaginal pain.  Musculoskeletal: Negative for back pain.  Skin: Negative for rash.  Neurological: Negative for dizziness, light-headedness and headaches.     Physical Exam Triage Vital Signs ED Triage Vitals  Enc Vitals Group     BP 03/27/20 1043 123/86     Pulse Rate 03/27/20 1043 (!) 109     Resp --  Temp 03/27/20 1043 98.3 F (36.8 C)     Temp Source 03/27/20 1043 Oral     SpO2 03/27/20 1043 100 %     Weight --      Height --      Head Circumference --      Peak Flow --      Pain Score 03/27/20 1045 10     Pain Loc --      Pain Edu? --      Excl. in GC? --    No data found.  Updated Vital Signs BP 123/86 (BP Location: Right Arm)   Pulse (!) 109   Temp 98.3 F (36.8 C) (Oral)   LMP 02/28/2020 (Exact Date)   SpO2 100%   Visual Acuity Right Eye Distance:   Left Eye Distance:   Bilateral Distance:    Right Eye Near:   Left Eye Near:    Bilateral Near:     Physical Exam Vitals and nursing note reviewed.    Constitutional:      Appearance: She is well-developed.     Comments: No acute distress  HENT:     Head: Normocephalic and atraumatic.     Nose: Nose normal.  Eyes:     Conjunctiva/sclera: Conjunctivae normal.  Cardiovascular:     Rate and Rhythm: Normal rate.  Pulmonary:     Effort: Pulmonary effort is normal. No respiratory distress.  Abdominal:     General: There is no distension.  Genitourinary:    Comments: Vulva and labia majora with erythema extending onto proximal thigh/inguinal area, white plaques present, unclear if this is from applied Vaseline, labia minora and inner labia majora with yellowish circular lesions that appear ulcerative  Significant discomfort with any palpation in vulva  Cannot tolerate speculum exam Musculoskeletal:        General: Normal range of motion.     Cervical back: Neck supple.  Skin:    General: Skin is warm and dry.  Neurological:     Mental Status: She is alert and oriented to person, place, and time.      UC Treatments / Results  Labs (all labs ordered are listed, but only abnormal results are displayed) Labs Reviewed  HSV CULTURE AND TYPING  CERVICOVAGINAL ANCILLARY ONLY    EKG   Radiology No results found.  Procedures Procedures (including critical care time)  Medications Ordered in UC Medications - No data to display  Initial Impression / Assessment and Plan / UC Course  I have reviewed the triage vital signs and the nursing notes.  Pertinent labs & imaging results that were available during my care of the patient were reviewed by me and considered in my medical decision making (see chart for details).     Cervicovaginal swab pending.  HSV for lesions pending.  Concerning for HSV, empirically started on Valtrex.  External exam also suggestive of yeast dermatitis, placing on Diflucan and topical clotrimazole.  Cannot rule out secondary bacterial infection, covering with doxycycline.  May apply topical lidocaine,  barrier creams.  Patient declined pregnancy test due to discomfort.  Answered questions.  Recommended follow-up with OB/GYN for further treatment and management of HSV.  Discussed strict return precautions. Patient verbalized understanding and is agreeable with plan.  Final Clinical Impressions(s) / UC Diagnoses   Final diagnoses:  Vaginitis and vulvovaginitis  Genital sore     Discharge Instructions     Lesions concerning for herpes/HSV- swabs pending for HSV and STD screening Begin Valtrex 3 times  a day until 48 hours after lesions resolve  Redness also acute concerning for yeast infection, take 1 tablet of Diflucan today, use clotrimazole twice daily to the skin areas, may repeat second tablet of Diflucan if still having symptoms towards the end of the week  Begin doxycycline twice daily for 1 week to cover for bacterial infection/cellulitis  Please monitor symptoms, follow-up if not improving or worsening   ED Prescriptions    Medication Sig Dispense Auth. Provider   fluconazole (DIFLUCAN) 150 MG tablet  (Status: Discontinued) Take 1 tablet (150 mg total) by mouth once for 1 dose. 2 tablet Celia Friedland C, PA-C   clotrimazole (LOTRIMIN) 1 % cream  (Status: Discontinued) Apply to affected area 2 times daily 30 g Parisa Pinela C, PA-C   valACYclovir (VALTREX) 1000 MG tablet  (Status: Discontinued) Take 1 tablet (1,000 mg total) by mouth 3 (three) times daily for 14 days. 42 tablet Lenisha Lacap C, PA-C   doxycycline (VIBRAMYCIN) 100 MG capsule  (Status: Discontinued) Take 1 capsule (100 mg total) by mouth 2 (two) times daily for 7 days. 14 capsule Marybella Ethier C, PA-C   clotrimazole (LOTRIMIN) 1 % cream Apply to affected area 2 times daily 30 g Myia Bergh C, PA-C   fluconazole (DIFLUCAN) 150 MG tablet Take 1 tablet (150 mg total) by mouth once for 1 dose. 2 tablet Shereese Bonnie C, PA-C   valACYclovir (VALTREX) 1000 MG tablet Take 1 tablet (1,000 mg total) by mouth 3  (three) times daily for 14 days. 42 tablet Anaily Ashbaugh C, PA-C   doxycycline (VIBRAMYCIN) 100 MG capsule Take 1 capsule (100 mg total) by mouth 2 (two) times daily for 7 days. 14 capsule Shar Paez, [place] C, PA-C     PDMP not reviewed this encounter.   Lew Dawes, [place] 03/27/20 1132

## 2020-03-27 NOTE — Discharge Instructions (Addendum)
Lesions concerning for herpes/HSV- swabs pending for HSV and STD screening Begin Valtrex 3 times a day until 48 hours after lesions resolve  Redness also acute concerning for yeast infection, take 1 tablet of Diflucan today, use clotrimazole twice daily to the skin areas, may repeat second tablet of Diflucan if still having symptoms towards the end of the week  Begin doxycycline twice daily for 1 week to cover for bacterial infection/cellulitis  Please monitor symptoms, follow-up if not improving or worsening

## 2020-03-30 ENCOUNTER — Telehealth (HOSPITAL_COMMUNITY): Payer: Self-pay | Admitting: Orthopedic Surgery

## 2020-03-30 LAB — CERVICOVAGINAL ANCILLARY ONLY
Bacterial Vaginitis (gardnerella): POSITIVE — AB
Candida Glabrata: POSITIVE — AB
Candida Vaginitis: POSITIVE — AB
Chlamydia: NEGATIVE
Comment: NEGATIVE
Comment: NEGATIVE
Comment: NEGATIVE
Comment: NEGATIVE
Comment: NEGATIVE
Comment: NORMAL
Neisseria Gonorrhea: NEGATIVE
Trichomonas: NEGATIVE

## 2020-03-30 LAB — HSV CULTURE AND TYPING

## 2020-03-31 ENCOUNTER — Telehealth (HOSPITAL_COMMUNITY): Payer: Self-pay | Admitting: Orthopedic Surgery

## 2020-03-31 MED ORDER — FLUCONAZOLE 150 MG PO TABS
150.0000 mg | ORAL_TABLET | Freq: Every day | ORAL | 0 refills | Status: AC
Start: 2020-03-31 — End: 2020-04-02

## 2020-03-31 MED ORDER — METRONIDAZOLE 500 MG PO TABS
500.0000 mg | ORAL_TABLET | Freq: Two times a day (BID) | ORAL | 0 refills | Status: DC
Start: 2020-03-31 — End: 2020-12-23

## 2020-04-02 NOTE — Telephone Encounter (Signed)
done

## 2020-09-28 ENCOUNTER — Ambulatory Visit (HOSPITAL_COMMUNITY)
Admission: EM | Admit: 2020-09-28 | Discharge: 2020-09-28 | Disposition: A | Discharge status: Home / Self Care | Payer: HRSA Program | Attending: Family Medicine | Admitting: Family Medicine

## 2020-09-28 ENCOUNTER — Other Ambulatory Visit: Payer: Self-pay

## 2020-09-28 ENCOUNTER — Encounter (HOSPITAL_COMMUNITY): Payer: Self-pay

## 2020-09-28 DIAGNOSIS — U071 COVID-19: Secondary | ICD-10-CM | POA: Diagnosis not present

## 2020-09-28 LAB — SARS CORONAVIRUS 2 (TAT 6-24 HRS): SARS Coronavirus 2: POSITIVE — AB

## 2020-09-28 NOTE — ED Triage Notes (Signed)
Pt in with c/o productive cough and runny nose that has been going on for 1 week now. States that she has been around her sister's boyfriend who has covid  Denies any N/v, diarrhea, or other uri sxs

## 2020-11-14 ENCOUNTER — Ambulatory Visit (HOSPITAL_COMMUNITY)
Admission: EM | Admit: 2020-11-14 | Discharge: 2020-11-14 | Disposition: A | Discharge status: Home / Self Care | Payer: Medicaid Other

## 2020-11-14 ENCOUNTER — Encounter (HOSPITAL_COMMUNITY): Payer: Self-pay

## 2020-11-14 ENCOUNTER — Other Ambulatory Visit: Payer: Self-pay

## 2020-11-14 DIAGNOSIS — L84 Corns and callosities: Secondary | ICD-10-CM

## 2020-11-14 DIAGNOSIS — E119 Type 2 diabetes mellitus without complications: Secondary | ICD-10-CM

## 2020-11-14 DIAGNOSIS — Z794 Long term (current) use of insulin: Secondary | ICD-10-CM

## 2020-11-14 NOTE — ED Triage Notes (Signed)
Pt presents with right foot pain x 2 weeks. Pt states it is hard to walk on the right foot and applying pressure hurts. Pt states she noticed an abscess on the right side bottom of foot.

## 2020-11-14 NOTE — ED Provider Notes (Signed)
[facility]    CSN: [id number] Arrival date & time: 11/14/20  1107      History   Chief Complaint Chief Complaint  Patient presents with  . Foot Pain  . Abscess    HPI Annette [name] is a 30 y.o. female.   Patient presents with 2-week history of pain in the bottom of her right foot due to a callus.  She has other calluses on her feet also.  She denies redness, drainage, fever, chills, numbness, weakness, paresthesias, or other symptoms.  No treatments attempted at home.  Patient is an insulin-dependent diabetic.  Her medical history includes diabetes, pulmonary embolism, DVT, COVID-19 on 09/16/2019.  The history is provided by the patient and medical records.    Past Medical History:  Diagnosis Date  . Ankle fracture   . Diabetes mellitus without complication [facility])     Patient Active Problem List   Diagnosis Date Noted  . Acute deep vein thrombosis (DVT) of proximal vein of right lower extremity ([facility])   . COVID-19   . Pulmonary embolus ([facility]) 09/16/2019    History reviewed. No pertinent surgical history.  OB History   No obstetric history on file.      Home Medications    Prior to Admission medications   Medication Sig Start Date End Date Taking? Authorizing Provider  apixaban (ELIQUIS) 5 MG TABS tablet Take 2 tablets (10mg ) twice daily for 7 days, then 1 tablet (5mg ) twice daily 09/17/19   Meccariello, , DO  clotrimazole (LOTRIMIN) 1 % cream Apply to affected area 2 times daily 03/27/20   Wieters, Hallie C, PA-C  insulin glargine (LANTUS) 100 UNIT/ML injection Inject 20 Units into the skin daily.     [provider]  metroNIDAZOLE (FLAGYL) 500 MG tablet Take 1 tablet (500 mg total) by mouth 2 (two) times daily. 03/31/20   Lamptey6/27/21, MD    Family History Family History  Problem Relation Age of Onset  . Diabetes Mother   . Diabetes Other     Social History Social History   Tobacco Use  . Smoking status: Never Smoker  .  Smokeless tobacco: Never Used     Allergies   Patient has no known allergies.   Review of Systems Review of Systems  Constitutional: Negative for chills and fever.  HENT: Negative for ear pain and sore throat.   Eyes: Negative for pain and visual disturbance.  Respiratory: Negative for cough and shortness of breath.   Cardiovascular: Negative for chest pain and palpitations.  Gastrointestinal: Negative for abdominal pain and vomiting.  Genitourinary: Negative for dysuria and hematuria.  Musculoskeletal: Negative for arthralgias and back pain.  Skin: Negative for color change and rash.       Foot calluses.   Neurological: Negative for syncope, weakness and numbness.  All other systems reviewed and are negative.    Physical Exam Triage Vital Signs ED Triage Vitals  Enc Vitals Group     BP 11/14/20 1129 (!) 125/95     Pulse Rate 11/14/20 1129 (!) 112     Resp 11/14/20 1129 18     Temp 11/14/20 1129 99.3 F (37.4 C)     Temp Source 11/14/20 1129 Oral     SpO2 11/14/20 1129 100 %     Weight --      Height --      Head Circumference --      Peak Flow --      Pain Score 11/14/20 1128  9     Pain Loc --      Pain Edu? --      Excl. in GC? --    No data found.  Updated Vital Signs BP (!) 125/95 (BP Location: Right Arm)   Pulse (!) 112   Temp 99.3 F (37.4 C) (Oral)   Resp 18   LMP 10/12/2020 (Approximate)   SpO2 100%   Visual Acuity Right Eye Distance:   Left Eye Distance:   Bilateral Distance:    Right Eye Near:   Left Eye Near:    Bilateral Near:     Physical Exam Vitals and nursing note reviewed.  Constitutional:      General: She is not in acute distress.    Appearance: She is well-developed and well-nourished. She is not ill-appearing.  HENT:     Head: Normocephalic and atraumatic.     Mouth/Throat:     Mouth: Mucous membranes are moist.  Eyes:     Conjunctiva/sclera: Conjunctivae normal.  Cardiovascular:     Rate and Rhythm: Normal rate and  regular rhythm.     Heart sounds: Normal heart sounds.  Pulmonary:     Effort: Pulmonary effort is normal. No respiratory distress.     Breath sounds: Normal breath sounds.  Abdominal:     Palpations: Abdomen is soft.     Tenderness: There is no abdominal tenderness.  Musculoskeletal:        General: Tenderness present. No swelling, deformity or edema. Normal range of motion.     Cervical back: Neck supple.       Feet:  Feet:     Comments: Calluses on plantar surface of bilateral feet.  No erythema, open wounds, drainage. Skin:    General: Skin is warm and dry.     Capillary Refill: Capillary refill takes less than 2 seconds.     Findings: No bruising or erythema.  Neurological:     General: No focal deficit present.     Mental Status: She is alert and oriented to person, place, and time.     Sensory: No sensory deficit.     Motor: No weakness.     Gait: Gait normal.  Psychiatric:        Mood and Affect: Mood and affect and mood normal.        Behavior: Behavior normal.      UC Treatments / Results  Labs (all labs ordered are listed, but only abnormal results are displayed) Labs Reviewed - No data to display  EKG   Radiology No results found.  Procedures Procedures (including critical care time)  Medications Ordered in UC Medications - No data to display  Initial Impression / Assessment and Plan / UC Course  I have reviewed the triage vital signs and the nursing notes.  Pertinent labs & imaging results that were available during my care of the patient were reviewed by me and considered in my medical decision making (see chart for details).   Foot callus, insulin-dependent diabetic.  Instructed patient to follow-up with a podiatrist as soon as possible.  Discussed keeping her feet clean and dry; and applying a moisturizing cream daily.  She agrees to plan of care.   Final Clinical Impressions(s) / UC Diagnoses   Final diagnoses:  Foot callus  Insulin  dependent type 2 diabetes mellitus [facility])     Discharge Instructions     Follow-up with a podiatrist as discussed.    Keep your feet clean and dry.  Apply  a moisturizing cream daily.        ED Prescriptions    None     PDMP not reviewed this encounter.   Mickie Bail, NP 11/14/20 [phone]

## 2020-11-14 NOTE — Discharge Instructions (Signed)
Follow-up with a podiatrist as discussed.    Keep your feet clean and dry.  Apply a moisturizing cream daily.

## 2020-12-23 ENCOUNTER — Other Ambulatory Visit: Payer: Self-pay

## 2020-12-23 ENCOUNTER — Emergency Department (HOSPITAL_COMMUNITY)
Admission: EM | Admit: 2020-12-23 | Discharge: 2020-12-23 | Disposition: A | Discharge status: Home / Self Care | Payer: Medicaid Other | Attending: Emergency Medicine | Admitting: Emergency Medicine

## 2020-12-23 ENCOUNTER — Encounter (HOSPITAL_COMMUNITY): Payer: Self-pay | Admitting: Emergency Medicine

## 2020-12-23 DIAGNOSIS — E1165 Type 2 diabetes mellitus with hyperglycemia: Secondary | ICD-10-CM | POA: Insufficient documentation

## 2020-12-23 DIAGNOSIS — Z7984 Long term (current) use of oral hypoglycemic drugs: Secondary | ICD-10-CM | POA: Insufficient documentation

## 2020-12-23 DIAGNOSIS — L84 Corns and callosities: Secondary | ICD-10-CM | POA: Insufficient documentation

## 2020-12-23 DIAGNOSIS — Z8616 Personal history of COVID-19: Secondary | ICD-10-CM | POA: Insufficient documentation

## 2020-12-23 DIAGNOSIS — R739 Hyperglycemia, unspecified: Secondary | ICD-10-CM

## 2020-12-23 DIAGNOSIS — Z7901 Long term (current) use of anticoagulants: Secondary | ICD-10-CM | POA: Insufficient documentation

## 2020-12-23 LAB — BASIC METABOLIC PANEL
Anion gap: 8 (ref 5–15)
BUN: 6 mg/dL (ref 6–20)
CO2: 22 mmol/L (ref 22–32)
Calcium: 9.1 mg/dL (ref 8.9–10.3)
Chloride: 103 mmol/L (ref 98–111)
Creatinine, Ser: 0.64 mg/dL (ref 0.44–1.00)
GFR, Estimated: >60 mL/min (ref >60)
Glucose, Bld: 453 mg/dL — ABNORMAL HIGH (ref 70–99)
Potassium: 4 mmol/L (ref 3.5–5.1)
Sodium: 133 mmol/L — ABNORMAL LOW (ref 135–145)

## 2020-12-23 LAB — URINALYSIS, ROUTINE W REFLEX MICROSCOPIC
Bilirubin Urine: NEGATIVE
Glucose, UA: >=500 mg/dL — AB
Hgb urine dipstick: NEGATIVE
Ketones, ur: NEGATIVE mg/dL
Nitrite: NEGATIVE
Protein, ur: NEGATIVE mg/dL
Specific Gravity, Urine: 1.038 — ABNORMAL HIGH (ref 1.005–1.030)
pH: 6 (ref 5.0–8.0)

## 2020-12-23 LAB — I-STAT BETA HCG BLOOD, ED (MC, WL, AP ONLY): I-stat hCG, quantitative: <5 m[IU]/mL (ref <5)

## 2020-12-23 LAB — CBC
HCT: 44.8 % (ref 36.0–46.0)
Hemoglobin: 15 g/dL (ref 12.0–15.0)
MCH: 29 pg (ref 26.0–34.0)
MCHC: 33.5 g/dL (ref 30.0–36.0)
MCV: 86.7 fL (ref 80.0–100.0)
Platelets: 320 10*3/uL (ref 150–400)
RBC: 5.17 MIL/uL — ABNORMAL HIGH (ref 3.87–5.11)
RDW: 12 % (ref 11.5–15.5)
WBC: 5.6 10*3/uL (ref 4.0–10.5)
nRBC: 0 % (ref 0.0–0.2)

## 2020-12-23 LAB — CBG MONITORING, ED: Glucose-Capillary: 425 mg/dL — ABNORMAL HIGH (ref 70–99)

## 2020-12-23 MED ORDER — INSULIN ASPART 100 UNIT/ML ~~LOC~~ SOLN
10.0000 [IU] | Freq: Once | SUBCUTANEOUS | Status: AC
  Administered 2020-12-23: 10 [IU] via SUBCUTANEOUS

## 2020-12-23 MED ORDER — INSULIN GLARGINE 100 UNIT/ML ~~LOC~~ SOLN: 20.0000 [IU] | Freq: Every day | SUBCUTANEOUS | 0 refills | Status: DC

## 2020-12-23 NOTE — ED Notes (Signed)
Pt reports she ran out of insulin last week. Blood sugar was 425 in triage. Insulin 10units SQ given. Will continue to monitor.

## 2020-12-23 NOTE — ED Provider Notes (Addendum)
[facility] EMERGENCY DEPARTMENT Provider Note   CSN: [id number] Arrival date & time: 12/23/20  1324     History Chief Complaint  Patient presents with  . Hyperglycemia  . foot wound    Annette [name] is a 30 y.o. female.  HPI   30 year old female past medical history of DM, PE/DVT anticoagulated on Eliquis presents the emergency department concern for hyperglycemia as well as right foot pain at the base of the great toe and at the base of the pinky toe.  Patient states she ran out of her insulin medication last week however her blood sugars have been running high between 300-400 for "many months".  Over the last week the patient has developed calluses at the base of the great toe and pinky on the right.  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  She is complaining of worsening pain in regards to the calluses and has concern for running out of her insulin prescription.  She otherwise denies any fever, chest pain, shortness of breath, abdominal pain, nausea/vomiting/diarrhea, swelling of her lower extremities.  She states that she is been compliant with her Eliquis.  Past Medical History:  Diagnosis Date  . Ankle fracture   . Diabetes mellitus without complication [facility])     Patient Active Problem List   Diagnosis Date Noted  . Acute deep vein thrombosis (DVT) of proximal vein of right lower extremity (HCC)   . COVID-19   . Pulmonary embolus (HCC) 09/16/2019    History reviewed. No pertinent surgical history.   OB History   No obstetric history on file.     Family History  Problem Relation Age of Onset  . Diabetes Mother   . Diabetes Other     Social History   Tobacco Use  . Smoking status: Never Smoker  . Smokeless tobacco: Never Used    Home Medications Prior to Admission medications   Medication Sig Start Date End Date Taking? Authorizing Provider  apixaban (ELIQUIS) 5 MG TABS tablet Take 2  tablets (10mg ) twice daily for 7 days, then 1 tablet (5mg ) twice daily 09/17/19   Meccariello, , DO  clotrimazole (LOTRIMIN) 1 % cream Apply to affected area 2 times daily 03/27/20   Wieters, Hallie C, PA-C  insulin glargine (LANTUS) 100 UNIT/ML injection Inject 20 Units into the skin daily.     [provider]  metroNIDAZOLE (FLAGYL) 500 MG tablet Take 1 tablet (500 mg total) by mouth 2 (two) times daily. 03/31/20   Lamptey, 03/29/20, MD    Allergies    Patient has no known allergies.  Review of Systems   Review of Systems  Constitutional: Negative for chills and fever.  HENT: Negative for congestion.   Eyes: Negative for visual disturbance.  Respiratory: Negative for shortness of breath.   Cardiovascular: Negative for chest pain.  Gastrointestinal: Negative for abdominal pain, diarrhea and vomiting.  Endocrine: Positive for polydipsia and polyuria.       + Hyperglycemia  Genitourinary: Negative for dysuria.  Musculoskeletal:       + Right foot pain  Skin: Negative for rash.  Neurological: Negative for headaches.    Physical Exam Updated Vital Signs BP 119/88 (BP Location: Right Arm)   Pulse 97   Temp 98 F (36.7 C) (Oral)   Resp 15   LMP 12/05/2020   SpO2 99%   Physical Exam Vitals and nursing note reviewed.  Constitutional:  Appearance: Normal appearance.  HENT:     Head: Normocephalic.     Mouth/Throat:     Mouth: Mucous membranes are moist.  Cardiovascular:     Rate and Rhythm: Normal rate.  Pulmonary:     Effort: Pulmonary effort is normal. No respiratory distress.  Abdominal:     Palpations: Abdomen is soft.     Tenderness: There is no abdominal tenderness.  Musculoskeletal:     Comments: Calluses at the base of the right great toe and right pinky toe, slightly tender to touch, no surrounding cellulitis, no open wound, no drainage, no edema of either lower extremity  Skin:    General: Skin is warm.  Neurological:     Mental Status: She  is alert and oriented to person, place, and time. Mental status is at baseline.  Psychiatric:        Mood and Affect: Mood normal.     ED Results / Procedures / Treatments   Labs (all labs ordered are listed, but only abnormal results are displayed) Labs Reviewed  BASIC METABOLIC PANEL - Abnormal; Notable for the following components:      Result Value   Sodium 133 (*)    Glucose, Bld 453 (*)    All other components within normal limits  CBC - Abnormal; Notable for the following components:   RBC 5.17 (*)    All other components within normal limits  CBG MONITORING, ED - Abnormal; Notable for the following components:   Glucose-Capillary 425 (*)    All other components within normal limits  URINALYSIS, ROUTINE W REFLEX MICROSCOPIC  CBG MONITORING, ED  I-STAT BETA HCG BLOOD, ED ([facility], [facility], [facility] ONLY)    EKG None  Radiology No results found.  Procedures Procedures   Medications Ordered in ED Medications  insulin aspart (novoLOG) injection 10 Units (10 Units Subcutaneous Given 12/23/20 1648)    ED Course  I have reviewed the triage vital signs and the nursing notes.  Pertinent labs & imaging results that were available during my care of the patient were reviewed by me and considered in my medical decision making (see chart for details).    MDM Rules/Calculators/A&P                          30 year old female presented to the emergency department concern for hyperglycemia, being without her insulin for a week and calluses on the right foot.  Vitals are stable on arrival, she is well-appearing, sitting in her bed, talking on her phone.  No nausea/vomiting.  Blood work shows hyperglycemia without acute findings of DKA.  Initial blood sugar was 453, subcutaneous insulin ordered.  Plan to recheck fingerstick.  In regards to the foot calluses will recommend symptomatic and conservative management with padding and change in shoes.  Patient absconded from the emergency room, I was  notified when the patient could not be found.  I did not see the patient leave, we did not get to get a repeat fingerstick following the subcutaneous insulin dose.  I will send a prescription for insulin based off her home medication list and attempt to call the patient however patient left without completing her medical evaluation today.  Final Clinical Impression(s) / ED Diagnoses Final diagnoses:  None    Rx / DC Orders ED Discharge Orders    None       Rozelle Logan, DO 12/23/20 1935    Rozelle Logan, DO 12/23/20 0017

## 2020-12-23 NOTE — ED Notes (Signed)
Unable to locate pt to recheck blood sugar.

## 2020-12-23 NOTE — ED Notes (Signed)
Prior nurse reports unable to locate patient since 1830. Pat9ient still unable to be located.  MD notified.

## 2020-12-23 NOTE — ED Triage Notes (Signed)
Pt reports sore to R lateral foot x 1 month and hyperglycemia.

## 2021-01-13 ENCOUNTER — Ambulatory Visit (HOSPITAL_COMMUNITY)
Admission: EM | Admit: 2021-01-13 | Discharge: 2021-01-13 | Disposition: A | Discharge status: Home / Self Care | Payer: Self-pay | Attending: Emergency Medicine | Admitting: Emergency Medicine

## 2021-01-13 ENCOUNTER — Encounter (HOSPITAL_COMMUNITY): Payer: Self-pay | Admitting: Emergency Medicine

## 2021-01-13 ENCOUNTER — Other Ambulatory Visit: Payer: Self-pay

## 2021-01-13 DIAGNOSIS — K0889 Other specified disorders of teeth and supporting structures: Secondary | ICD-10-CM

## 2021-01-13 MED ORDER — NAPROXEN 500 MG PO TABS: 500.0000 mg | ORAL_TABLET | Freq: Two times a day (BID) | ORAL | 0 refills | Status: DC

## 2021-01-13 MED ORDER — HYDROCODONE-ACETAMINOPHEN 5-325 MG PO TABS
ORAL_TABLET | ORAL | Status: AC
  Filled 2021-01-13: qty 1

## 2021-01-13 MED ORDER — HYDROCODONE-ACETAMINOPHEN 5-325 MG PO TABS
1.0000 | ORAL_TABLET | Freq: Once | ORAL | Status: AC
  Administered 2021-01-13: 1 via ORAL

## 2021-01-13 MED ORDER — AMOXICILLIN-POT CLAVULANATE 875-125 MG PO TABS: 1.0000 | ORAL_TABLET | Freq: Two times a day (BID) | ORAL | 0 refills | Status: DC

## 2021-01-13 NOTE — ED Triage Notes (Signed)
Pt presents dental pain and swelling xs 1 week. States has hole in tooth on left side of mouth. OTC pain medications are giving no relief.

## 2021-01-13 NOTE — Discharge Instructions (Addendum)
Complete course of antibiotics.  Naproxen twice a day for pain, take with food. Don't take any additional ibuprofen or aleve.  Please follow up with a dentist for definitive treatment.

## 2021-01-13 NOTE — ED Provider Notes (Signed)
[facility]    CSN: [id number] Arrival date & time: 01/13/21  1524      History   Chief Complaint Chief Complaint  Patient presents with  . Dental Pain    HPI Annette [name] is a 30 y.o. female.   Annette [name] presents with complaints of left lower dental pain for the past week which is worsening. Subjective fevers. Known dental abnormality. Doesn't follow with a dentist. Has taken over the counter  Medications which haven' t helped with pain.     ROS per HPI, negative if not otherwise mentioned.      Past Medical History:  Diagnosis Date  . Ankle fracture   . Diabetes mellitus without complication [facility])     Patient Active Problem List   Diagnosis Date Noted  . Acute deep vein thrombosis (DVT) of proximal vein of right lower extremity (HCC)   . COVID-19   . Pulmonary embolus (HCC) 09/16/2019    History reviewed. No pertinent surgical history.  OB History   No obstetric history on file.      Home Medications    Prior to Admission medications   Medication Sig Start Date End Date Taking? Authorizing Provider  amoxicillin-clavulanate (AUGMENTIN) 875-125 MG tablet Take 1 tablet by mouth every 12 (twelve) hours. 01/13/21  Yes Ellery Meroney, Dorene Grebe B, NP  naproxen (NAPROSYN) 500 MG tablet Take 1 tablet (500 mg total) by mouth 2 (two) times daily. 01/13/21  Yes Georgetta Haber, NP  apixaban (ELIQUIS) 5 MG TABS tablet Take 2 tablets (10mg ) twice daily for 7 days, then 1 tablet (5mg ) twice daily Patient not taking: No sig reported 09/17/19   Meccariello, , DO  clotrimazole (LOTRIMIN) 1 % cream Apply to affected area 2 times daily Patient not taking: No sig reported 03/27/20   Wieters, Hallie C, PA-C  insulin glargine (LANTUS) 100 UNIT/ML injection Inject 0.2 mLs (20 Units total) into the skin daily. 12/23/20   Horton, 03/29/20, DO    Family History Family History  Problem Relation Age of Onset  . Diabetes Mother   . Diabetes Other     Social  History Social History   Tobacco Use  . Smoking status: Never Smoker  . Smokeless tobacco: Never Used     Allergies   Patient has no known allergies.   Review of Systems Review of Systems   Physical Exam Triage Vital Signs ED Triage Vitals  Enc Vitals Group     BP 01/13/21 1616 129/89     Pulse Rate 01/13/21 1616 (!) 115     Resp 01/13/21 1616 16     Temp 01/13/21 1616 98.6 F (37 C)     Temp Source 01/13/21 1616 Oral     SpO2 01/13/21 1616 98 %     Weight --      Height --      Head Circumference --      Peak Flow --      Pain Score 01/13/21 1614 8     Pain Loc --      Pain Edu? --      Excl. in GC? --    No data found.  Updated Vital Signs BP 129/89 (BP Location: Right Arm)   Pulse (!) 115   Temp 98.6 F (37 C) (Oral)   Resp 16   LMP 01/05/2021   SpO2 98%   Visual Acuity Right Eye Distance:   Left Eye Distance:   Bilateral Distance:    Right Eye Near:  Left Eye Near:    Bilateral Near:     Physical Exam Constitutional:      General: She is not in acute distress.    Appearance: She is well-developed.  HENT:     Mouth/Throat:     Dentition: Abnormal dentition. Dental tenderness present.      Comments: Left lower jaw line with tenderness under molars; no visible abscess present  Cardiovascular:     Rate and Rhythm: Normal rate.  Pulmonary:     Effort: Pulmonary effort is normal.  Skin:    General: Skin is warm and dry.  Neurological:     Mental Status: She is alert and oriented to person, place, and time.      UC Treatments / Results  Labs (all labs ordered are listed, but only abnormal results are displayed) Labs Reviewed - No data to display  EKG   Radiology No results found.  Procedures Procedures (including critical care time)  Medications Ordered in UC Medications  HYDROcodone-acetaminophen (NORCO/VICODIN) 5-325 MG per tablet 1 tablet (1 tablet Oral Given 01/13/21 1659)    Initial Impression / Assessment and Plan /  UC Course  I have reviewed the triage vital signs and the nursing notes.  Pertinent labs & imaging results that were available during my care of the patient were reviewed by me and considered in my medical decision making (see chart for details).     Significant dental abnormalities with tenderness which has been worsening over the past week. antibiotics and pain management provided with dental follow up emphasized.  Final Clinical Impressions(s) / UC Diagnoses   Final diagnoses:  Pain, dental     Discharge Instructions     Complete course of antibiotics.  Naproxen twice a day for pain, take with food. Don't take any additional ibuprofen or aleve.  Please follow up with a dentist for definitive treatment.    ED Prescriptions    Medication Sig Dispense Auth. Provider   amoxicillin-clavulanate (AUGMENTIN) 875-125 MG tablet Take 1 tablet by mouth every 12 (twelve) hours. 14 tablet Linus Mako B, NP   naproxen (NAPROSYN) 500 MG tablet Take 1 tablet (500 mg total) by mouth 2 (two) times daily. 30 tablet Georgetta Haber, NP     PDMP not reviewed this encounter.   Georgetta Haber, NP 01/13/21 1702

## 2021-01-17 ENCOUNTER — Encounter (HOSPITAL_COMMUNITY): Payer: Self-pay

## 2021-01-17 ENCOUNTER — Ambulatory Visit (HOSPITAL_COMMUNITY)
Admission: EM | Admit: 2021-01-17 | Discharge: 2021-01-17 | Disposition: A | Discharge status: Home / Self Care | Payer: Medicaid Other | Attending: Emergency Medicine | Admitting: Emergency Medicine

## 2021-01-17 ENCOUNTER — Other Ambulatory Visit: Payer: Self-pay

## 2021-01-17 DIAGNOSIS — K0889 Other specified disorders of teeth and supporting structures: Secondary | ICD-10-CM

## 2021-01-17 DIAGNOSIS — K047 Periapical abscess without sinus: Secondary | ICD-10-CM

## 2021-01-17 MED ORDER — CEFUROXIME AXETIL 500 MG PO TABS: 500.0000 mg | ORAL_TABLET | Freq: Two times a day (BID) | ORAL | 0 refills | Status: AC

## 2021-01-17 MED ORDER — HYDROCODONE-ACETAMINOPHEN 5-325 MG PO TABS: 1.0000 | ORAL_TABLET | Freq: Four times a day (QID) | ORAL | 0 refills | Status: DC | PRN

## 2021-01-17 MED ORDER — METRONIDAZOLE 500 MG PO TABS: 500.0000 mg | ORAL_TABLET | Freq: Two times a day (BID) | ORAL | 0 refills | Status: AC

## 2021-01-17 MED ORDER — LIDOCAINE VISCOUS HCL 2 % MT SOLN: 10.0000 mL | Freq: Four times a day (QID) | OROMUCOSAL | 0 refills | Status: DC | PRN

## 2021-01-17 MED ORDER — CHLORHEXIDINE GLUCONATE 0.12 % MT SOLN: OROMUCOSAL | 0 refills | Status: DC

## 2021-01-17 NOTE — ED Triage Notes (Signed)
Pt in with c/o left lower dental pain that has been going on for 2 weeks   Pt was seen a few days ago and has been taking antibiotic and pain medicine with no relief

## 2021-01-17 NOTE — ED Provider Notes (Signed)
HPI  SUBJECTIVE:  Annette [name] is a 30 y.o. female who presents with onset of sore throbbing, left lower dental pain for 2 weeks.  She states that it radiates to her left ear. Was seen here on 4/13 for this.  Given Norco x1 here, sent home with Augmentin and Naprosyn, which she states that she is taking as directed.  She states that the pain is getting worse.  She reports fevers to 101 on day #2 of antibiotics, facial and gingival swelling started yesterday.  No trismus, drooling, sore throat, voice changes, swelling underneath the tongue, swelling underneath the jaw, neck stiffness.  She has been taking Naprosyn, Augmentin and doing rinses.  The rinses help.  Symptoms worse with eating.  States that she cannot sleep, eat or drink secondary to the pain.  States that she has called multiple dentists and is unable to get into see one.  Past medical history of diabetes, DVT right lower extremity, PE, COVID.  Patient states that she is currently taking Eliquis.  LMP: 4/5.  Denies possibility being pregnant.  PMD: None.  Past Medical History:  Diagnosis Date  . Ankle fracture   . Diabetes mellitus without complication (HCC)     History reviewed. No pertinent surgical history.  Family History  Problem Relation Age of Onset  . Diabetes Mother   . Diabetes Other     Social History   Tobacco Use  . Smoking status: Never Smoker  . Smokeless tobacco: Never Used    No current facility-administered medications for this encounter.  Current Outpatient Medications:  .  cefUROXime (CEFTIN) 500 MG tablet, Take 1 tablet (500 mg total) by mouth 2 (two) times daily with a meal for 7 days., Disp: 14 tablet, Rfl: 0 .  chlorhexidine (PERIDEX) 0.12 % solution, 15 mL swish and spit bid, Disp: 480 mL, Rfl: 0 .  HYDROcodone-acetaminophen (NORCO/VICODIN) 5-325 MG tablet, Take 1-2 tablets by mouth every 6 (six) hours as needed for moderate pain or severe pain., Disp: 12 tablet, Rfl: 0 .  lidocaine (XYLOCAINE) 2  % solution, Use as directed 10 mLs in the mouth or throat every 6 (six) hours as needed for mouth pain. Hold in mouth and spit. Do not swallow., Disp: 100 mL, Rfl: 0 .  metroNIDAZOLE (FLAGYL) 500 MG tablet, Take 1 tablet (500 mg total) by mouth 2 (two) times daily for 7 days., Disp: 14 tablet, Rfl: 0 .  apixaban (ELIQUIS) 5 MG TABS tablet, Take 2 tablets (10mg ) twice daily for 7 days, then 1 tablet (5mg ) twice daily (Patient not taking: No sig reported), Disp: 60 tablet, Rfl: 0 .  clotrimazole (LOTRIMIN) 1 % cream, Apply to affected area 2 times daily (Patient not taking: No sig reported), Disp: 30 g, Rfl: 0 .  insulin glargine (LANTUS) 100 UNIT/ML injection, Inject 0.2 mLs (20 Units total) into the skin daily., Disp: 10 mL, Rfl: 0  No Known Allergies   ROS  As noted in HPI.   Physical Exam  BP 136/89   Pulse (!) 113   Temp 98 F (36.7 C)   Resp 17   LMP 01/05/2021   SpO2 98%   Constitutional: Well developed, well nourished, no acute distress Eyes:  EOMI, conjunctiva normal bilaterally HENT: Normocephalic, atraumatic,mucus membranes moist.  Tooth 19 tender to palpation with caries, tooth 17,18 eroded to the gum, but tender to palpation.  Positive gingival swelling locally.  No expressible purulent drainage.  Positive tenderness along the left lower face.  No appreciable facial  swelling.  No swelling, induration underneath the jaw.  No swelling underneath the tongue.  No drooling, trismus, muffled voice.   Neck: No cervical lymphadenopathy, no neck stiffness. Respiratory: Normal inspiratory effort Cardiovascular: Tachycardia GI: nondistended skin: No rash, skin intact Musculoskeletal: no deformities Neurologic: Alert & oriented x 3, no focal neuro deficits Psychiatric: Speech and behavior appropriate   ED Course   Medications - No data to display  No orders of the defined types were placed in this encounter.   No results found for this or any previous visit (from the past  24 hour(s)). No results found.  ED Clinical Impression  1. Dental infection   2. Pain, dental      ED Assessment/Plan  Previous records reviewed.  As noted in HPI.  [place] Narcotic database reviewed for this patient, and feel that the risk/benefit ratio today is favorable for proceeding with a prescription for controlled substance.  No opiate prescriptions in the past 2 years.  Patient with a dental infection not responding to initial therapy with Augmentin.  No evidence of Ludwicg's angina, deep space infection, airway compromise at this time.  Tachycardia noted, but she appears uncomfortable.  Doubt sepsis.  Will send home on cefuroxime and Flagyl for 1 week.  We will have her stop the Naprosyn because of the Eliquis, send her home with a Tylenol-containing product 3-4 times a day.  Either 1000 g of Tylenol for mild to moderate pain or 1-2 Norco for severe pain.  Discussed with patient did not take Tylenol and Norco.  Viscous lidocaine, Peridex or Listerine.  Will provide dental list.  Advised her to follow-up with a dentist ASAP.  Will also provide primary care list and order assistance in finding a PMD.  Discussed labs, imaging, MDM, treatment plan, and plan for follow-up with patient. Discussed sn/sx that should prompt return to the ED. patient agrees with plan.   Meds ordered this encounter  Medications  . chlorhexidine (PERIDEX) 0.12 % solution    Sig: 15 mL swish and spit bid    Dispense:  480 mL    Refill:  0  . HYDROcodone-acetaminophen (NORCO/VICODIN) 5-325 MG tablet    Sig: Take 1-2 tablets by mouth every 6 (six) hours as needed for moderate pain or severe pain.    Dispense:  12 tablet    Refill:  0  . lidocaine (XYLOCAINE) 2 % solution    Sig: Use as directed 10 mLs in the mouth or throat every 6 (six) hours as needed for mouth pain. Hold in mouth and spit. Do not swallow.    Dispense:  100 mL    Refill:  0  . cefUROXime (CEFTIN) 500 MG tablet    Sig: Take 1 tablet (500 mg  total) by mouth 2 (two) times daily with a meal for 7 days.    Dispense:  14 tablet    Refill:  0  . metroNIDAZOLE (FLAGYL) 500 MG tablet    Sig: Take 1 tablet (500 mg total) by mouth 2 (two) times daily for 7 days.    Dispense:  14 tablet    Refill:  0      *This clinic note was created using Scientist, clinical (histocompatibility and immunogenetics). Therefore, there may be occasional mistakes despite careful proofreading.  ?    Domenick Gong, MD 01/19/21 [phone]

## 2021-01-17 NOTE — Discharge Instructions (Addendum)
1000 g of Tylenol for mild to moderate pain or 1-2 Norco for severe pain 3-4 times a day.  Stop Naprosyn..   Viscous lidocaine will help with the, pain.  Finish the cefuroxime and Flagyl, even if you feel better.  You will be on this for 7 days.  Peridex or Listerine mouthwash as antibacterial treatment.  Go immediately to the ER for persistent fevers, inability open up your mouth, inability to swallow because of throat pain or throat swelling shut, drooling, neck stiffness, swelling underneath your jaw or tongue, or for other concerns.  Below is a list of primary care practices who are taking new patients for you to follow-up with.  [facility] internal medicine clinic Ground Floor - [facility], [address] [place], [place], [place] [address] [phone]  [facility] at [facility] [address] [place], [place] [address] [phone]  [facility] [address] [place], [place] [address] [phone]  [facility] [phone] [address] [place] [address]  [facility]: [address] [place] [address]  [phone]  [facility]: [address] [place] [address]   [phone]  [facility]: [address] [place] [address]  [phone]  [facility] primary care : [address] [place] [address] [phone]  [facility] Primary Care: [address] [place] [address] [phone]  [facility]: [address] [place] [address] [phone]  Dr. Oneal Grout [address] [facility] [place] [address]  [phone]  Go to www.goodrx.com to look up your medications. This will give you a list of where you can find your prescriptions  at the most affordable prices. Or ask the pharmacist what the cash price is, or if they have any other discount programs available to help make your medication more affordable. This can be less expensive than what you would pay with insurance.

## 2021-01-25 ENCOUNTER — Encounter: Payer: Self-pay | Admitting: *Deleted

## 2021-02-02 ENCOUNTER — Other Ambulatory Visit: Payer: Self-pay

## 2021-02-02 ENCOUNTER — Encounter (HOSPITAL_BASED_OUTPATIENT_CLINIC_OR_DEPARTMENT_OTHER): Payer: Self-pay | Attending: Internal Medicine | Admitting: Internal Medicine

## 2021-02-02 ENCOUNTER — Other Ambulatory Visit (HOSPITAL_COMMUNITY)
Admission: RE | Admit: 2021-02-02 | Discharge: 2021-02-02 | Disposition: A | Discharge status: Home / Self Care | Payer: Medicaid Other | Source: Other Acute Inpatient Hospital | Attending: Internal Medicine | Admitting: Internal Medicine

## 2021-02-02 DIAGNOSIS — E1142 Type 2 diabetes mellitus with diabetic polyneuropathy: Secondary | ICD-10-CM | POA: Insufficient documentation

## 2021-02-02 DIAGNOSIS — Z86711 Personal history of pulmonary embolism: Secondary | ICD-10-CM | POA: Insufficient documentation

## 2021-02-02 DIAGNOSIS — L97518 Non-pressure chronic ulcer of other part of right foot with other specified severity: Secondary | ICD-10-CM | POA: Insufficient documentation

## 2021-02-02 DIAGNOSIS — Z86718 Personal history of other venous thrombosis and embolism: Secondary | ICD-10-CM | POA: Insufficient documentation

## 2021-02-02 DIAGNOSIS — E11621 Type 2 diabetes mellitus with foot ulcer: Secondary | ICD-10-CM | POA: Insufficient documentation

## 2021-02-02 NOTE — Progress Notes (Signed)
Annette [name], Annette [name] ([id number]) Visit Report for 02/02/2021 Abuse/Suicide Risk Screen Details Patient Name: Date of Service: Annette [name] Annette [name] Annette [name] 02/02/2021 1:15 PM Medical Record Number: [id number] Patient Account Number: [id number] Date of Birth/Sex: Treating RN: August 05, 1991 (30 y.o. Female) Annette [name] Primary Care Annette [name]: Annette [name] Annette [name]: Referring Annette [name]: Treating [place] Ruud/Extender: Annette [name]: 0 Abuse/Suicide Risk Screen Items Answer ABUSE RISK SCREEN: Has anyone close to you tried to hurt or harm you recentlyo No Do you feel uncomfortable with anyone in your familyo No Has anyone forced you do things that you didnt want to doo No Electronic Signature(s) Signed: 02/02/2021 5:08:22 PM By: Annette Deed RN, BSN Entered By: Annette [name] on 02/02/2021 14:09:28 -------------------------------------------------------------------------------- Activities of Daily Living Details Patient Name: Date of Service: Annette [name] [facility] Annette [name] 02/02/2021 1:15 PM Medical Record Number: [id number] Patient Account Number: [id number] Date of Birth/Sex: Treating RN: April 26, 1991 (30 y.o. Female) Annette [name] Primary Care Tailey Top: Annette [place] Annette [name]: Referring Annette [name]: Treating Annette [name]/Extender: Annette [name]: 0 Activities of Daily Living Items Answer Activities of Daily Living (Please select one for each item) Drive Automobile Not Able T Medications ake Completely Able Use T elephone Completely Able Care for Appearance Completely Able Use T oilet Completely Able Bath / Shower Completely Able Dress Self Completely Able Feed Self Completely Able Walk Completely Able Get In / Out Bed Completely Able Housework Completely Able Prepare Meals Completely Able Handle Money Completely Able Shop for Self Completely Able Electronic Signature(s) Signed: 02/02/2021 5:08:22 PM By: Annette Deed RN, BSN Entered By: Annette [name] on 02/02/2021 14:09:48 -------------------------------------------------------------------------------- Education Screening Details Patient Name: Date of Service: Annette [name] Annette [name] 02/02/2021 1:15 PM Medical Record Number: [id number] Patient Account Number: [id number] Date of Birth/Sex: Treating RN: 1991-05-12 (30 y.o. Female) Annette [name] Primary Care Annette [name]: Annette [name] Annette [name]: Referring Annette [name]: Treating Annette [name]/Extender: Annette [name]: 0 Primary Learner Assessed: Patient Learning Preferences/Education Level/Primary Language Learning Preference: Explanation, Demonstration, Printed Material Highest Education Level: College or Above Preferred Language: English Cognitive Barrier Language Barrier: No Translator Needed: No Memory Deficit: No Emotional Barrier: No Cultural/Religious Beliefs Affecting Medical Care: No Physical Barrier Impaired Vision: Yes Glasses Impaired Hearing: No Decreased Hand dexterity: No Knowledge/Comprehension Knowledge Level: High Comprehension Level: High Ability to understand written instructions: High Ability to understand verbal instructions: High Motivation Anxiety Level: Calm Cooperation: Cooperative Education Importance: Acknowledges Need Interest in Health Problems: Asks Questions Perception: Coherent Willingness to Engage in Self-Management High Activities: Readiness to Engage in Self-Management High Activities: Electronic Signature(s) Signed: 02/02/2021 5:08:22 PM By: Annette Deed RN, BSN Entered By: Annette [name] on 02/02/2021 14:10:26 -------------------------------------------------------------------------------- Fall Risk Assessment Details Patient Name: Date of Service: Annette [name] Annette [name] 02/02/2021 1:15 PM Medical Record Number: [id number] Patient Account Number: [id number] Date of Birth/Sex: Treating RN: 05-18-1991 (30 y.o.  Female) Annette [name] Primary Care Annahi Short: Annette  Annette [name]: Referring Annette [name]: Treating Annette [name]/Extender: Annette [name]: 0 Fall Risk Assessment Items Have you had 2 or more falls in the last 12 monthso 0 No Have you had any fall that resulted in injury in the last 12 monthso 0 No FALLS RISK SCREEN History of falling - immediate or within 3 months 0 No Secondary diagnosis (Do you have 2 or more medical diagnoseso) 0 No Ambulatory aid None/bed rest/wheelchair/nurse 0 Yes Crutches/cane/walker 0 No Furniture 0 No  Intravenous therapy Access/Saline/Heparin Lock 0 No Gait/Transferring Normal/ bed rest/ wheelchair 0 Yes Weak (short steps with or without shuffle, stooped but able to lift head while walking, may seek 0 No support from furniture) Impaired (short steps with shuffle, may have difficulty arising from chair, head down, impaired 0 No balance) Mental Status Oriented to own ability 0 Yes Electronic Signature(s) Signed: 02/02/2021 5:08:22 PM By: Annette Deed RN, BSN Entered By: Annette [name] on 02/02/2021 14:13:36 -------------------------------------------------------------------------------- Foot Assessment Details Patient Name: Date of Service: Annette [name] Annette [name] 02/02/2021 1:15 PM Medical Record Number: [id number] Patient Account Number: [id number] Date of Birth/Sex: Treating RN: 05-01-1991 (30 y.o. Female) Annette [name] Primary Care Manya Balash: Annette [place] Annette [name]: Referring Annette [name]: Treating Annette [name]/Extender: Annette [name]: 0 Foot Assessment Items Site Locations + = Sensation present, - = Sensation absent, C = Callus, U = Ulcer R = Redness, W = Warmth, M = Maceration, PU = Pre-ulcerative lesion F = Fissure, S = Swelling, D = Dryness Assessment Right: Left: Annette Deformity: No No Prior Foot Ulcer: No No Prior Amputation: No No Charcot Joint:  No No Ambulatory Status: Ambulatory Without Help Gait: Steady Electronic Signature(s) Signed: 02/02/2021 5:08:22 PM By: Annette Deed RN, BSN Entered By: Annette [name] on 02/02/2021 14:18:09 -------------------------------------------------------------------------------- Nutrition Risk Screening Details Patient Name: Date of Service: Annette [name] Annette [name] 02/02/2021 1:15 PM Medical Record Number: [id number] Patient Account Number: [id number] Date of Birth/Sex: Treating RN: 01/13/1991 (30 y.o. Female) Annette [name] Primary Care Sarahanne Novakowski: Annette [place] Annette [name]: Referring Ralf Kon[place]: Treating Corin Formisano/Extender: Annette [name]: 0 Height (in): 65 Weight (lbs): 190 Body Mass Index (BMI): 31.6 Nutrition Risk Screening Items Score Screening NUTRITION RISK SCREEN: I have an illness or condition that made me change the kind and/or amount of food I eat 0 No I eat fewer than two meals per day 0 No I eat few fruits and vegetables, or milk products 0 No I have three or more drinks of beer, liquor or wine almost every day 0 No I have tooth or mouth problems that make it hard for me to eat 0 No I don't always have enough money to buy the food I need 0 No I eat alone most of the time 0 No I take three or more different prescribed or over-the-counter drugs a day 0 No Without wanting to, I have lost or gained 10 pounds in the last six months 2 Yes I am not always physically able to shop, cook and/or feed myself 0 No Nutrition Protocols Good Risk Protocol 0 No interventions needed Moderate Risk Protocol High Risk Proctocol Risk Level: Good Risk Score: 2 Electronic Signature(s) Signed: 02/02/2021 5:08:22 PM By: Annette Deed RN, BSN Entered By: Annette [name] on 02/02/2021 14:14:28

## 2021-02-05 LAB — AEROBIC CULTURE W GRAM STAIN (SUPERFICIAL SPECIMEN): Gram Stain: NONE SEEN

## 2021-02-05 NOTE — Progress Notes (Signed)
Annette [name], DAUGHETY ([id number]) Visit Report for 02/02/2021 Chief Complaint Document Details Patient Name: Date of Service: Annette [name] [facility] [name] 02/02/2021 1:15 PM Medical Record Number: [id number] Patient Account Number: [id number] Date of Birth/Sex: Treating RN: [birth year] (30 y.o. Female) [name] Primary Care Provider: Heber [place] Other Clinician: Referring Provider: Treating Provider/Extender: Marcial Pacas Weeks in Treatment: 0 Information Obtained from: Patient Chief Complaint 02/02/2021; patient arrives for review of a wound over her right plantar fifth metatarsal head Electronic Signature(s) Signed: 02/02/2021 4:28:01 PM By: Baltazar Najjar MD Entered By: Baltazar Najjar on 02/02/2021 15:03:21 -------------------------------------------------------------------------------- Debridement Details Patient Name: Date of Service: Annette [name] 02/02/2021 1:15 PM Medical Record Number: [id number] Patient Account Number: [id number] Date of Birth/Sex: Treating RN: January 19, 1991 (30 y.o. Female) [name] Primary Care Provider: Heber [place] Other Clinician: Referring Provider: Treating Provider/Extender: Marcial Pacas Weeks in Treatment: 0 Debridement Performed for Assessment: Wound #1 Right Metatarsal head fifth Performed By: Physician Maxwell Caul., MD Debridement Type: Debridement Severity of Tissue Pre Debridement: Fat layer exposed Level of Consciousness (Pre-procedure): Awake and Alert Pre-procedure Verification/Time Out Yes - 14:50 Taken: Start Time: 14:50 Pain Control: Lidocaine T Area Debrided (L x W): otal 0.3 (cm) x 0.3 (cm) = 0.09 (cm) Tissue and other material debrided: Viable, Non-Viable, Callus, Slough, Subcutaneous, Skin: Dermis , Skin: Epidermis, Slough Level: Skin/Subcutaneous Tissue Debridement Description: Excisional Instrument: Blade, Curette, Forceps Specimen: Tissue Culture Number of Specimens  T aken: 1 Bleeding: Minimum Hemostasis Achieved: Pressure End Time: 14:51 Procedural Pain: 0 Post Procedural Pain: 0 Response to Treatment: Procedure was tolerated well Level of Consciousness (Post- Awake and Alert procedure): Post Debridement Measurements of Total Wound Length: (cm) 2.4 Width: (cm) 2.4 Depth: (cm) 0.2 Volume: (cm) 0.905 Character of Wound/Ulcer Post Debridement: Improved Severity of Tissue Post Debridement: Fat layer exposed Post Procedure Diagnosis Same as Pre-procedure Electronic Signature(s) Signed: 02/02/2021 4:28:01 PM By: Baltazar Najjar MD Signed: 02/05/2021 3:14:58 PM By: [name] RN Entered By: Baltazar Najjar on 02/02/2021 15:02:35 -------------------------------------------------------------------------------- HPI Details Patient Name: Date of Service: Annette [name] 02/02/2021 1:15 PM Medical Record Number: [id number] Patient Account Number: [id number] Date of Birth/Sex: Treating RN: 1991-08-03 (30 y.o. Female) [name] Primary Care Provider: Heber [place] Other Clinician: Referring Provider: Treating Provider/Extender: Marcial Pacas Weeks in Treatment: 0 History of Present Illness HPI Description: ADMISSION 02/02/2021 Is a patient who identifies herself as a type II diabetic having at one point been on pills before graduating to insulin. She came from [place] to this area 2 years ago. 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 She developed an area on her right fifth metatarsal head I believe some months ago. She was in urgent care on February with pain in the bottom of her foot noted to have calluses and referred to podiatry. They have not been doing anything particular to the wound she has not been offloading this or doing specific dressings. On arrival there was white material that came out of the wound our intake nurse was not convinced this was pus but we will certainly send that for  culture. She has not had this x-rayed Past medical history includes type 2 diabetes with peripheral neuropathy, COVID-19 in 2020 at which point she had a DVT and pulmonary embolism with a DVT being on the right leg, ABIs in our clinic were 1.15 on the right and 1.10 on the left  Electronic Signature(s) Signed: 02/02/2021 4:28:01 PM By: Baltazar Najjar MD Entered By: Baltazar Najjar on 02/02/2021 15:06:02 -------------------------------------------------------------------------------- Physical Exam Details Patient Name: Date of Service: Annette [name] 02/02/2021 1:15 PM Medical Record Number: [id number] Patient Account Number: [id number] Date of Birth/Sex: Treating RN: 03/27/1991 (30 y.o. Female) [name] Primary Care Provider: Heber [place] Other Clinician: Referring Provider: Treating Provider/Extender: Marcial Pacas Weeks in Treatment: 0 Constitutional Sitting or standing Blood Pressure is within target range for patient.. Pulse regular and within target range for patient.Marland Kitchen Respirations regular, non-labored and within target range.. Temperature is normal and within the target range for the patient.Marland Kitchen Appears in no distress. Respiratory work of breathing is normal. Cardiovascular Pedal pulses at both the dorsalis pedis and posterior tibial pulse are palpable. Neurological Loss of sensation to the monofilament also vibration sense in the plantar right foot. Notes Wound exam; the area questions over the fifth metatarsal head small orifice to the wound however surrounded by very thick callus. I used pickups and a #15 scalpel to remove this I am able to get to a small relatively healthy looking wound. There is 10 superiorly however which is concerning for infection Electronic Signature(s) Signed: 02/02/2021 4:28:01 PM By: Baltazar Najjar MD Entered By: Baltazar Najjar on 02/02/2021  15:10:21 -------------------------------------------------------------------------------- Physician Orders Details Patient Name: Date of Service: Annette [name] 02/02/2021 1:15 PM Medical Record Number: [id number] Patient Account Number: [id number] Date of Birth/Sex: Treating RN: 1991/04/09 (30 y.o. Female) [name] Primary Care Provider: Heber [place] Other Clinician: Referring Provider: Treating Provider/Extender: Marcial Pacas Weeks in Treatment: 0 Verbal / Phone Orders: No Diagnosis Coding Follow-up Appointments Return Appointment in 1 week. Bathing/ Shower/ Hygiene May shower with protection but do not get wound dressing(s) wet. - on days dressing's changed you may shower and use soap and water Edema Control - Lymphedema / SCD / Other Elevate legs to the level of the heart or above for 30 minutes daily and/or when sitting, a frequency of: Avoid standing for long periods of time. Off-Loading Open toe surgical shoe to: - right foot Wound Treatment Wound #1 - Metatarsal head fifth Wound Laterality: Right Cleanser: Wound Cleanser Every Other Day/7 Days Discharge Instructions: Cleanse the wound with wound cleanser prior to applying a clean dressing using gauze sponges, not tissue or cotton balls. Prim Dressing: KerraCel Ag Gelling Fiber Dressing, 4x5 in (silver alginate) Every Other Day/7 Days ary Discharge Instructions: Apply silver alginate to wound bed as instructed Secondary Dressing: Woven Gauze Sponge, Non-Sterile 4x4 in Every Other Day/7 Days Discharge Instructions: Apply over primary dressing as directed. Secondary Dressing: ABD Pad, 5x9 Every Other Day/7 Days Discharge Instructions: Apply over primary dressing as directed. Secondary Dressing: Optifoam Non-Adhesive Dressing, 4x4 in Every Other Day/7 Days Discharge Instructions: Apply over primary dressing as directed. Foam donut over primary dressing for offload Secured With: [employer], 4.5x3.1 (in/yd) Every Other Day/7 Days Discharge Instructions: Secure with Kerlix as directed. Secured With: 62M Medipore H Soft Cloth Surgical T 4 x 2 (in/yd) Every Other Day/7 Days ape Discharge Instructions: Secure dressing with tape as directed. Laboratory naerobe culture (MICRO) Bacteria identified in Unspecified specimen by A LOINC Code: 635-3 Convenience Name: Anerobic culture Electronic Signature(s) Signed: 02/02/2021 4:28:01 PM By: Baltazar Najjar MD Signed: 02/05/2021 3:14:58 PM By: [name] RN Entered By: [name] on 02/02/2021 15:03:23 -------------------------------------------------------------------------------- Problem List Details Patient Name: Date of Service: Annette [name] 02/02/2021 1:15 PM  Medical Record Number: [id number] Patient Account Number: [id number] Date of Birth/Sex: Treating RN: 1990-10-15 (30 y.o. Female) [name] Primary Care Provider: Heber [place] Other Clinician: Referring Provider: Treating Provider/Extender: Gerre Scull, Salome Holmes Weeks in Treatment: 0 Active Problems ICD-10 Encounter Code Description Active Date MDM Diagnosis E11.621 Type 2 diabetes mellitus with foot ulcer 02/02/2021 No Yes L97.518 Non-pressure chronic ulcer of other part of right foot with other specified 02/02/2021 No Yes severity E11.42 Type 2 diabetes mellitus with diabetic polyneuropathy 02/02/2021 No Yes Inactive Problems Resolved Problems Electronic Signature(s) Signed: 02/02/2021 4:28:01 PM By: Baltazar Najjar MD Entered By: Baltazar Najjar on 02/02/2021 15:02:04 -------------------------------------------------------------------------------- Progress Note Details Patient Name: Date of Service: Annette [name] 02/02/2021 1:15 PM Medical Record Number: [id number] Patient Account Number: [id number] Date of Birth/Sex: Treating RN: 09/27/1991 (30 y.o. Female) [name] Primary Care Provider: Heber [place]  Other Clinician: Referring Provider: Treating Provider/Extender: Marcial Pacas Weeks in Treatment: 0 Subjective Chief Complaint Information obtained from Patient 02/02/2021; patient arrives for review of a wound over her right plantar fifth metatarsal head History of Present Illness (HPI) ADMISSION 02/02/2021 Is a patient who identifies herself as a type II diabetic having at one point been on pills before graduating to insulin. She came from [place] to this area 2 years ago. 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 She developed an area on her right fifth metatarsal head I believe some months ago. She was in urgent care on February with pain in the bottom of her foot noted to have calluses and referred to podiatry. They have not been doing anything particular to the wound she has not been offloading this or doing specific dressings. On arrival there was white material that came out of the wound our intake nurse was not convinced this was pus but we will certainly send that for culture. She has not had this x-rayed Past medical history includes type 2 diabetes with peripheral neuropathy, COVID-19 in 2020 at which point she had a DVT and pulmonary embolism with a DVT being on the right leg, ABIs in our clinic were 1.15 on the right and 1.10 on the left Patient History Information obtained from Patient. Allergies No Known Allergies Family History Cancer - Maternal Grandparents, Diabetes - Mother,Maternal Grandparents, Hypertension - Maternal Grandparents, Seizures - Siblings, No family history of Heart Disease, Hereditary Spherocytosis, Kidney Disease, Lung Disease, Stroke, Thyroid Problems, Tuberculosis. Social History Never smoker, Marital Status - Single, Alcohol Use - Rarely, Drug Use - No History, Caffeine Use - Daily - soda. Medical History Eyes Denies history of Cataracts, Glaucoma, Optic Neuritis Cardiovascular Patient has history of Deep  Vein Thrombosis - right leg Endocrine Patient has history of Type I Diabetes Denies history of Type II Diabetes Genitourinary Denies history of End Stage Renal Disease Immunological Denies history of Lupus Erythematosus, Raynaudoos, Scleroderma Integumentary (Skin) Denies history of History of Burn Oncologic Denies history of Received Chemotherapy, Received Radiation Psychiatric Denies history of Anorexia/bulimia, Confinement Anxiety Patient is treated with Insulin. Blood sugar is tested. Medical A Surgical History Notes nd Respiratory Pulmonary embolism Cardiovascular PE Review of Systems (ROS) Constitutional Symptoms (General Health) Denies complaints or symptoms of Fatigue, Fever, Chills, Marked Weight Change. Eyes Complains or has symptoms of Glasses / Contacts. Denies complaints or symptoms of Dry Eyes, Vision Changes. Ear/Nose/Mouth/Throat Denies complaints or symptoms of Chronic sinus problems or rhinitis. Respiratory Denies complaints or symptoms of Chronic or frequent  coughs, Shortness of Breath. Cardiovascular Denies complaints or symptoms of Chest pain. Gastrointestinal Denies complaints or symptoms of Frequent diarrhea, Nausea, Vomiting. Endocrine Denies complaints or symptoms of Heat/cold intolerance. Genitourinary Denies complaints or symptoms of Frequent urination. Integumentary (Skin) Complains or has symptoms of Wounds - right foot. Musculoskeletal Denies complaints or symptoms of Muscle Pain, Muscle Weakness. Neurologic Denies complaints or symptoms of Numbness/parasthesias. Psychiatric Denies complaints or symptoms of Claustrophobia, Suicidal. Objective Constitutional Sitting or standing Blood Pressure is within target range for patient.. Pulse regular and within target range for patient.Marland Kitchen. Respirations regular, non-labored and within target range.. Temperature is normal and within the target range for the patient.Marland Kitchen. Appears in no distress. Vitals  Time Taken: 1:39 PM, Height: 65 in, Source: Stated, Weight: 190 lbs, Source: Stated, BMI: 31.6, Temperature: 97.9 F, Pulse: 125 bpm, Respiratory Rate: 18 breaths/min, Blood Pressure: 120/80 mmHg, Capillary Blood Glucose: 539 mg/dl. General Notes: glucose per pt report yesterday Respiratory work of breathing is normal. Cardiovascular Pedal pulses at both the dorsalis pedis and posterior tibial pulse are palpable. Neurological Loss of sensation to the monofilament also vibration sense in the plantar right foot. General Notes: Wound exam; the area questions over the fifth metatarsal head small orifice to the wound however surrounded by very thick callus. I used pickups and a #15 scalpel to remove this I am able to get to a small relatively healthy looking wound. There is 10 superiorly however which is concerning for infection Integumentary (Hair, Skin) Wound #1 status is Open. Original cause of wound was Gradually Appeared. The date acquired was: 11/23/2020. The wound is located on the Right Metatarsal head fifth. The wound measures 0.3cm length x 0.3cm width x 0.7cm depth; 0.071cm^2 area and 0.049cm^3 volume. There is Fat Layer (Subcutaneous Tissue) exposed. There is no tunneling noted, however, there is undermining starting at 12:00 and ending at 12:00 with a maximum distance of 1cm. There is a small amount of serosanguineous drainage noted. The wound margin is thickened. There is large (67-100%) red granulation within the wound bed. There is no necrotic tissue within the wound bed. Assessment Active Problems ICD-10 Type 2 diabetes mellitus with foot ulcer Non-pressure chronic ulcer of other part of right foot with other specified severity Type 2 diabetes mellitus with diabetic polyneuropathy Procedures Wound #1 Pre-procedure diagnosis of Wound #1 is a Diabetic Wound/Ulcer of the Lower Extremity located on the Right Metatarsal head fifth .Severity of Tissue Pre Debridement is: Fat layer  exposed. There was a Excisional Skin/Subcutaneous Tissue Debridement with a total area of 0.09 sq cm performed by Maxwell Caulobson, Alwaleed Obeso G., MD. With the following instrument(s): Blade, Curette, and Forceps to remove Viable and Non-Viable tissue/material. Material removed includes Callus, Subcutaneous Tissue, Slough, Skin: Dermis, and Skin: Epidermis after achieving pain control using Lidocaine. 1 specimen was taken by a Tissue Culture and sent to the lab per facility protocol. A time out was conducted at 14:50, prior to the start of the procedure. A Minimum amount of bleeding was controlled with Pressure. The procedure was tolerated well with a pain level of 0 throughout and a pain level of 0 following the procedure. Post Debridement Measurements: 2.4cm length x 2.4cm width x 0.2cm depth; 0.905cm^3 volume. Character of Wound/Ulcer Post Debridement is improved. Severity of Tissue Post Debridement is: Fat layer exposed. Post procedure Diagnosis Wound #1: Same as Pre-Procedure Plan Follow-up Appointments: Return Appointment in 1 week. Bathing/ Shower/ Hygiene: May shower with protection but do not get wound dressing(s) wet. - on days dressing's changed  you may shower and use soap and water Edema Control - Lymphedema / SCD / Other: Elevate legs to the level of the heart or above for 30 minutes daily and/or when sitting, a frequency of: Avoid standing for long periods of time. Off-Loading: Open toe surgical shoe to: - right foot Laboratory ordered were: Anerobic culture WOUND #1: - Metatarsal head fifth Wound Laterality: Right Cleanser: Wound Cleanser Every Other Day/7 Days Discharge Instructions: Cleanse the wound with wound cleanser prior to applying a clean dressing using gauze sponges, not tissue or cotton balls. Prim Dressing: KerraCel Ag Gelling Fiber Dressing, 4x5 in (silver alginate) Every Other Day/7 Days ary Discharge Instructions: Apply silver alginate to wound bed as instructed Secondary  Dressing: Woven Gauze Sponge, Non-Sterile 4x4 in Every Other Day/7 Days Discharge Instructions: Apply over primary dressing as directed. Secondary Dressing: ABD Pad, 5x9 Every Other Day/7 Days Discharge Instructions: Apply over primary dressing as directed. Secondary Dressing: Optifoam Non-Adhesive Dressing, 4x4 in Every Other Day/7 Days Discharge Instructions: Apply over primary dressing as directed. Foam donut over primary dressing for offload Secured With: [employer], 4.5x3.1 (in/yd) Every Other Day/7 Days Discharge Instructions: Secure with Kerlix as directed. Secured With: 90M Medipore H Soft Cloth Surgical T 4 x 2 (in/yd) Every Other Day/7 Days ape Discharge Instructions: Secure dressing with tape as directed. 1. Fortunately not a large wound 2. While we await the culture results I put her on Celexa cephalexin 500 every 6 hours for 7 days because of the periwound tenderness 3. I did not order an x-ray but that may be necessary. 4. Silver alginate as the primary dressing 5. I have asked her to offload this and we have given her a surgical shoe with felt offloading. I have asked her to stop wearing crocs at work and use a running shoe with an insole. Not sure how much of this she will be compliant with. 6. I explained to her she has diabetic neuropathy and therefore will not get the usual warning that people get if there is an injury going on in her foot Electronic Signature(s) Signed: 02/02/2021 4:28:01 PM By: Baltazar Najjar MD Entered By: Baltazar Najjar on 02/02/2021 15:15:16 -------------------------------------------------------------------------------- HxROS Details Patient Name: Date of Service: Annette [name] 02/02/2021 1:15 PM Medical Record Number: [id number] Patient Account Number: [id number] Date of Birth/Sex: Treating RN: 1991-05-20 (30 y.o. Female) Zenaida Deed Primary Care Provider: Heber [place] Other Clinician: Referring Provider: Treating  Provider/Extender: Marcial Pacas Weeks in Treatment: 0 Information Obtained From Patient Constitutional Symptoms (General Health) Complaints and Symptoms: Negative for: Fatigue; Fever; Chills; Marked Weight Change Eyes Complaints and Symptoms: Positive for: Glasses / Contacts Negative for: Dry Eyes; Vision Changes Medical History: Negative for: Cataracts; Glaucoma; Optic Neuritis Ear/Nose/Mouth/Throat Complaints and Symptoms: Negative for: Chronic sinus problems or rhinitis Respiratory Complaints and Symptoms: Negative for: Chronic or frequent coughs; Shortness of Breath Medical History: Past Medical History Notes: Pulmonary embolism Cardiovascular Complaints and Symptoms: Negative for: Chest pain Medical History: Positive for: Deep Vein Thrombosis - right leg Past Medical History Notes: PE Gastrointestinal Complaints and Symptoms: Negative for: Frequent diarrhea; Nausea; Vomiting Endocrine Complaints and Symptoms: Negative for: Heat/cold intolerance Medical History: Positive for: Type I Diabetes Negative for: Type II Diabetes Time with diabetes: since 2008 Treated with: Insulin Blood sugar tested every day: Yes Tested : daily Genitourinary Complaints and Symptoms: Negative for: Frequent urination Medical History: Negative for: End Stage Renal Disease Integumentary (Skin) Complaints and Symptoms: Positive for: Wounds -  right foot Medical History: Negative for: History of Burn Musculoskeletal Complaints and Symptoms: Negative for: Muscle Pain; Muscle Weakness Neurologic Complaints and Symptoms: Negative for: Numbness/parasthesias Psychiatric Complaints and Symptoms: Negative for: Claustrophobia; Suicidal Medical History: Negative for: Anorexia/bulimia; Confinement Anxiety Hematologic/Lymphatic Immunological Medical History: Negative for: Lupus Erythematosus; Raynauds; Scleroderma Oncologic Medical History: Negative for:  Received Chemotherapy; Received Radiation Immunizations Pneumococcal Vaccine: Received Pneumococcal Vaccination: No Implantable Devices No devices added Family and Social History Cancer: Yes - Maternal Grandparents; Diabetes: Yes - Mother,Maternal Grandparents; Heart Disease: No; Hereditary Spherocytosis: No; Hypertension: Yes - Maternal Grandparents; Kidney Disease: No; Lung Disease: No; Seizures: Yes - Siblings; Stroke: No; Thyroid Problems: No; Tuberculosis: No; Never smoker; Marital Status - Single; Alcohol Use: Rarely; Drug Use: No History; Caffeine Use: Daily - soda; Financial Concerns: No; Food, Clothing or Shelter Needs: No; Support System Lacking: No; Transportation Concerns: No Psychologist, prison and probation services) Signed: 02/02/2021 4:28:01 PM By: Baltazar Najjar MD Signed: 02/02/2021 5:08:22 PM By: Zenaida Deed RN, BSN Entered By: Zenaida Deed on 02/02/2021 14:09:11 -------------------------------------------------------------------------------- SuperBill Details Patient Name: Date of Service: Annette [name] 02/02/2021 Medical Record Number: [id number] Patient Account Number: [id number] Date of Birth/Sex: Treating RN: [birth year] (30 y.o. Female) [name] Primary Care Provider: Heber [place] Other Clinician: Referring Provider: Treating Provider/Extender: Marcial Pacas Weeks in Treatment: 0 Diagnosis Coding ICD-10 Codes Code Description E11.621 Type 2 diabetes mellitus with foot ulcer L97.518 Non-pressure chronic ulcer of other part of right foot with other specified severity E11.42 Type 2 diabetes mellitus with diabetic polyneuropathy Facility Procedures CPT4 Code: [id number] Description: 99213 - WOUND CARE VISIT-LEV 3 EST PT Modifier: Quantity: 1 CPT4 Code: [id number] Description: 11042 - DEB SUBQ TISSUE 20 SQ CM/< ICD-10 Diagnosis Description L97.518 Non-pressure chronic ulcer of other part of right foot with other specified sever Modifier:  ity Quantity: 1 Physician Procedures : CPT4 Code Description Modifier [id number] WC PHYS LEVEL 3 NEW PT 25 ICD-10 Diagnosis Description E11.621 Type 2 diabetes mellitus with foot ulcer L97.518 Non-pressure chronic ulcer of other part of right foot with other specified severity E11.42 Type 2  diabetes mellitus with diabetic polyneuropathy Quantity: 1 : [id number] 11042 - WC PHYS SUBQ TISS 20 SQ CM ICD-10 Diagnosis Description L97.518 Non-pressure chronic ulcer of other part of right foot with other specified severity Quantity: 1 Electronic Signature(s) Signed: 02/02/2021 4:28:01 PM By: Baltazar Najjar MD Entered By: Baltazar Najjar on 02/02/2021 15:15:42

## 2021-02-05 NOTE — Progress Notes (Signed)
Annette, [name] ([id number]) Visit Report for 02/02/2021 Allergy List Details Patient Name: Date of Service: Annette [name] Annette [name] TIA 02/02/2021 1:15 PM Medical Record Number: [id number] Patient Account Number: [id number] Date of Birth/Sex: Treating RN: 08/08/1991 (30 y.o. Female) Zenaida Deed Primary Care Jolinda Pinkstaff: [place] Other Clinician: Referring Wendall Isabell: Treating Kamsiyochukwu Spickler/Extender: Gerre Scull, Salome Holmes Weeks in Treatment: 0 Allergies Active Allergies No Known Allergies Allergy Notes Electronic Signature(s) Signed: 02/02/2021 5:08:22 PM By: Zenaida Deed RN, BSN Entered By: Zenaida Deed on 02/02/2021 14:00:01 -------------------------------------------------------------------------------- Arrival Information Details Patient Name: Date of Service: BRELA [name], [name] 02/02/2021 1:15 PM Medical Record Number: [id number] Patient Account Number: [id number] Date of Birth/Sex: Treating RN: March 14, 1991 (30 y.o. Female) [name] Primary Care Jurnee Nakayama: [place] Other Clinician: Referring Mieczyslaw Stamas: Treating Indie Nickerson/Extender: Marcial Pacas Weeks in Treatment: 0 Visit Information Patient Arrived: Ambulatory Arrival Time: 13:38 Accompanied By: mother Transfer Assistance: None Patient Identification Verified: Yes Secondary Verification Process Completed: Yes Patient Requires Transmission-Based Precautions: No Patient Has Alerts: No Electronic Signature(s) Signed: 02/02/2021 5:08:22 PM By: Zenaida Deed RN, BSN Entered By: Zenaida Deed on 02/02/2021 13:52:30 -------------------------------------------------------------------------------- Clinic Level of Care Assessment Details Patient Name: Date of Service: BRELA [name], [employer] [name] TIA 02/02/2021 1:15 PM Medical Record Number: [id number] Patient Account Number: [id number] Date of Birth/Sex: Treating RN: [birth year] (30 y.o. Female) [name] Primary Care Ajwa Kimberley: [place]  Other Clinician: Referring Mell Mellott: Treating Teneisha Gignac/Extender: Marcial Pacas Weeks in Treatment: 0 Clinic Level of Care Assessment Items TOOL 1 Quantity Score X- 1 0 Use when EandM and Procedure is performed on INITIAL visit ASSESSMENTS - Nursing Assessment / Reassessment X- 1 20 General Physical Exam (combine w/ comprehensive assessment (listed just below) when performed on new pt. evals) X- 1 25 Comprehensive Assessment (HX, ROS, Risk Assessments, Wounds Hx, etc.) ASSESSMENTS - Wound and Skin Assessment / Reassessment X- 1 10 Dermatologic / Skin Assessment (not related to wound area) ASSESSMENTS - Ostomy and/or Continence Assessment and Care []  - 0 Incontinence Assessment and Management []  - 0 Ostomy Care Assessment and Management (repouching, etc.) PROCESS - Coordination of Care X - Simple Patient / Family Education for ongoing care 1 15 []  - 0 Complex (extensive) Patient / Family Education for ongoing care X- 1 10 Staff obtains , Records, T Results / Process Orders est []  - 0 Staff telephones HHA, Nursing Homes / Clarify orders / etc []  - 0 Routine Transfer to another Facility (non-emergent condition) []  - 0 Routine [name] Admission (non-emergent condition) X- 1 15 New Admissions / / Ordering NPWT Apligraf, etc. , []  - 0 Emergency [name] Admission (emergent condition) PROCESS - Special Needs []  - 0 Pediatric / Minor Patient Management []  - 0 Isolation Patient Management []  - 0 Hearing / Language / Visual special needs []  - 0 Assessment of Community assistance (transportation, D/C planning, etc.) []  - 0 Additional assistance / Altered mentation []  - 0 Support Surface(s) Assessment (bed, cushion, seat, etc.) INTERVENTIONS - Miscellaneous []  - 0 External ear exam []  - 0 Patient Transfer (multiple staff / / Similar devices) []  - 0 Simple Staple / Suture removal (25 or less) []  -  0 Complex Staple / Suture removal (26 or more) []  - 0 Hypo/Hyperglycemic Management (do not check if billed separately) X- 1 15 Ankle / Brachial Index (ABI) - do not check if billed separately Has the patient been seen at the [name] within the last three  years: Yes Total Score: 110 Level Of Care: New/Established - Level 3 Electronic Signature(s) Signed: 02/05/2021 3:14:58 PM By: [name] RN Entered By: [name] on 02/02/2021 14:56:41 -------------------------------------------------------------------------------- Encounter Discharge Information Details Patient Name: Date of Service: BRELA [name], [name] 02/02/2021 1:15 PM Medical Record Number: [id number] Patient Account Number: [id number] Date of Birth/Sex: Treating RN: 11-24-1990 (30 y.o. Female) [name] Primary Care Joycelynn Fritsche: He[place] Other Clinician: Referring Bueford Arp: Treating Colie Fugitt/Extender: Marcial Pacas Weeks in Treatment: 0 Encounter Discharge Information Items Post Procedure Vitals Discharge Condition: Stable Temperature (F): 97.9 Ambulatory Status: Ambulatory Pulse (bpm): 125 Discharge Destination: Home Respiratory Rate (breaths/min): 18 Transportation: Private Auto Blood Pressure (mmHg): 120/80 Accompanied By: Mother Schedule Follow-up Appointment: Yes Clinical Summary of Care: Provided on 02/02/2021 Form Type Recipient Paper Patient Patient Electronic Signature(s) Signed: 02/02/2021 3:00:19 PM By: [name] Entered By: [name] on 02/02/2021 15:00:19 -------------------------------------------------------------------------------- Lower Extremity Assessment Details Patient Name: Date of Service: BRELA [name], [name] 02/02/2021 1:15 PM Medical Record Number: [id number] Patient Account Number: [id number] Date of Birth/Sex: Treating RN: 03-Oct-1991 (30 y.o. Female) Zenaida Deed Primary Care Annette [name]: He[place] Other Clinician: Referring  Annette [name]: Treating Deyon Chizek/Extender: Marcial Pacas Weeks in Treatment: 0 Edema Assessment Assessed: [Left: No] [Right: No] E[Left: dema] [Right: :] Calf Left: Right: Point of Measurement: From Medial Instep 41.2 cm 41.3 cm Ankle Left: Right: Point of Measurement: From Medial Instep 22.2 cm 22.7 cm Vascular Assessment Pulses: Dorsalis Pedis Palpable: [Left:Yes] [Right:Yes] Blood Pressure: Brachial: [Left:120] [Right:120] Dorsalis Pedis: 118 [Left:Dorsalis Pedis: 132] Ankle: Posterior Tibial: 132 [Left:Posterior Tibial: 138 1.10] [Right:1.15] Electronic Signature(s) Signed: 02/02/2021 5:08:22 PM By: Zenaida Deed RN, BSN Entered By: Zenaida Deed on 02/02/2021 14:27:57 -------------------------------------------------------------------------------- Multi Wound Chart Details Patient Name: Date of Service: BRELA [name], [name] 02/02/2021 1:15 PM Medical Record Number: [id number] Patient Account Number: [id number] Date of Birth/Sex: Treating RN: 1991/07/20 (30 y.o. Female) [name] Primary Care Elysha Daw: [place] Other Clinician: Referring Chee Kinslow: Treating Kal Chait/Extender: Marcial Pacas Weeks in Treatment: 0 Vital Signs Height(in): 65 Capillary Blood Glucose(mg/dl): 829 Weight(lbs): 937 Pulse(bpm): 125 Body Mass Index(BMI): 32 Blood Pressure(mmHg): 120/80 Temperature(F): 97.9 Respiratory Rate(breaths/min): 18 Photos: [1:No Photos Right Metatarsal head fifth] [N/A:N/A N/A] Wound Location: [1:Gradually Appeared] [N/A:N/A] Wounding Event: [1:Diabetic Wound/Ulcer of the Lower] [N/A:N/A] Primary Etiology: [1:Extremity Deep Vein Thrombosis, Type I] [N/A:N/A] Comorbid History: [1:Diabetes 11/23/2020] [N/A:N/A] Date Acquired: [1:0] [N/A:N/A] Weeks of Treatment: [1:Open] [N/A:N/A] Wound Status: [1:0.3x0.3x0.7] [N/A:N/A] Measurements L x W x D (cm) [1:0.071] [N/A:N/A] A (cm) : rea [1:0.049] [N/A:N/A] Volume  (cm) : [1:12] Starting Position 1 (o'clock): [1:12] Ending Position 1 (o'clock): [1:1] Maximum Distance 1 (cm): [1:Yes] [N/A:N/A] Undermining: [1:Grade 1] [N/A:N/A] Classification: [1:Small] [N/A:N/A] Exudate A mount: [1:Serosanguineous] [N/A:N/A] Exudate Type: [1:red, brown] [N/A:N/A] Exudate Color: [1:Thickened] [N/A:N/A] Wound Margin: [1:Large (67-100%)] [N/A:N/A] Granulation A mount: [1:Red] [N/A:N/A] Granulation Quality: [1:None Present (0%)] [N/A:N/A] Necrotic A mount: [1:Fat Layer (Subcutaneous Tissue): Yes N/A] Exposed Structures: [1:Fascia: No Tendon: No Muscle: No Joint: No Bone: No None] [N/A:N/A] Epithelialization: [1:Debridement - Excisional] [N/A:N/A] Debridement: Pre-procedure Verification/Time Out 14:50 [N/A:N/A] Taken: [1:Lidocaine] [N/A:N/A] Pain Control: [1:Callus, Subcutaneous, Slough] [N/A:N/A] Tissue Debrided: [1:Skin/Subcutaneous Tissue] [N/A:N/A] Level: [1:0.09] [N/A:N/A] Debridement A (sq cm): [1:rea Blade, Curette, Forceps] [N/A:N/A] Instrument: [1:Minimum] [N/A:N/A] Bleeding: [1:Pressure] [N/A:N/A] Hemostasis A chieved: [1:0] [N/A:N/A] Procedural Pain: [1:0] [N/A:N/A] Post Procedural Pain: [1:Procedure was tolerated well] [N/A:N/A] Debridement Treatment Response: [1:2.4x2.4x0.2] [N/A:N/A] Post Debridement Measurements L x W x  D (cm) [1:0.905] [N/A:N/A] Post Debridement Volume: (cm) [1:Debridement] [N/A:N/A] Treatment Notes Wound #1 (Metatarsal head fifth) Wound Laterality: Right Cleanser Wound Cleanser Discharge Instruction: Cleanse the wound with wound cleanser prior to applying a clean dressing using gauze sponges, not tissue or cotton balls. Peri-Wound Care Topical Primary Dressing KerraCel Ag Gelling Fiber Dressing, 4x5 in (silver alginate) Discharge Instruction: Apply silver alginate to wound bed as instructed Secondary Dressing Woven Gauze Sponge, Non-Sterile 4x4 in Discharge Instruction: Apply over primary dressing as directed. ABD  Pad, 5x9 Discharge Instruction: Apply over primary dressing as directed. Optifoam Non-Adhesive Dressing, 4x4 in Discharge Instruction: Apply over primary dressing as directed. Foam donut over primary dressing for offload Secured With [employer] Roll Sterile, 4.5x3.1 (in/yd) Discharge Instruction: Secure with Kerlix as directed. 70M Medipore H Soft Cloth Surgical T 4 x 2 (in/yd) ape Discharge Instruction: Secure dressing with tape as directed. Compression Wrap Compression Stockings Add-Ons Electronic Signature(s) Signed: 02/02/2021 4:28:01 PM By: Baltazar Najjarobson, Michael MD Signed: 02/05/2021 3:14:58 PM By: Fonnie MuBreedlove, [name] RN Entered By: Baltazar Najjarobson, Michael on 02/02/2021 15:02:20 -------------------------------------------------------------------------------- Multi-Disciplinary Care Plan Details Patient Name: Date of [facility]: [name] 02/02/20[facility] 1:15 PM Medical Record Number: [id number] Patient Account Number: [id number] Date of Birth/Sex: Treating RN: 09/05/1991 (30 y.o. Female) Fonnie MuBreedlove, [name] Primary Care Manmeet Arzola: Heber CarolinaRESENZO, V ICTO R Other Clinician: Referring Aroush Chasse: Treating Mali Eppard/Extender: Marcial Pacasobson, Michael CRESENZO, V ICTO R Weeks in Treatment: 0 Active Inactive Orientation to the Wound Care Program Nursing Diagnoses: Knowledge deficit related to the wound healing center program Goals: Patient/caregiver will verbalize understanding of the Wound Healing Center Program Date Initiated: 02/02/2021 Target Resolution Date: 03/05/2021 Goal Status: Active Interventions: Provide education on orientation to the wound center Notes: Wound/Skin Impairment Nursing Diagnoses: Impaired tissue integrity Knowledge deficit related to ulceration/compromised skin integrity Goals: Patient will have a decrease in wound volume by X% from date: (specify in notes) Date Initiated: 02/02/2021 Target Resolution Date: 03/06/2021 Goal Status: Active Interventions: Assess patient/caregiver ability to obtain  necessary supplies Assess patient/caregiver ability to perform ulcer/skin care regimen upon admission and as needed Assess ulceration(s) every visit Provide education on ulcer and skin care Notes: Electronic Signature(s) Signed: 02/05/2021 3:14:58 PM By: Fonnie MuBreedlove, [name] RN Entered By: Fonnie MuBreedlove, [name] on 02/02/2021 15:01:01 -------------------------------------------------------------------------------- Pain Assessment Details Patient Name: Date of Service: BRELA [name], [name] 02/02/2021 1:15 PM Medical Record Number: [id number] Patient Account Number: [id number] Date of Birth/Sex: Treating RN: 12/20/1990 (30 y.o. Female) Zenaida DeedBoehlein, Linda Primary Care Anda Sobotta: Heber CarolinaRESENZO, V ICTO R Other Clinician: Referring Taje Littler: Treating Alaa Mullally/Extender: Marcial Pacasobson, Michael CRESENZO, V ICTO R Weeks in Treatment: 0 Active Problems Location of Pain Severity and Description of Pain Patient Has Paino Yes Site Locations Pain Location: Generalized Pain, Pain in Ulcers With Dressing Change: Yes Duration of the Pain. Constant / Intermittento Intermittent Rate the pain. Current Pain Level: 8 Worst Pain Level: 9 Least Pain Level: 7 Character of Pain Describe the Pain: Aching, Throbbing Pain Management and Medication Current Pain Management: Medication: Yes Is the Current Pain Management Adequate: Adequate Activity: Yes How does your wound impact your activities of daily livingo Sleep: Yes Bathing: No Appetite: No Relationship With Others: No Bladder Continence: No Emotions: No Bowel Continence: No Work: No Toileting: No Drive: No Dressing: No Hobbies: No Electronic Signature(s) Signed: 02/02/2021 5:08:22 PM By: Zenaida DeedBoehlein, Linda RN, BSN Entered By: Zenaida DeedBoehlein, Linda on 02/02/2021 14:29:08 -------------------------------------------------------------------------------- Patient/Caregiver Education Details Patient Name: Date of Service: Annette MoatsBRELA [name], [name] 5/3/2022andnbsp1:15 PM Medical Record  Number: [id number] Patient Account Number: [id number] Date of Birth/Gender: Treating RN:  02/04/1991 (30 y.o. Female) [name] Primary Care Physician: [place] Other Clinician: Referring Physician: Treating Physician/Extender: Marcial Pacas Weeks in Treatment: 0 Education Assessment Education Provided To: Patient Education Topics Provided Basic Hygiene: Methods: Explain/Verbal Responses: State content correctly Welcome T [facility]: o Methods: Explain/Verbal Responses: State content correctly Wound/Skin Impairment: Methods: Explain/Verbal Responses: Reinforcements needed, State content correctly Electronic Signature(s) Signed: 02/05/2021 3:14:58 PM By: [name] RN Entered By: [name] on 02/02/2021 14:48:56 -------------------------------------------------------------------------------- Wound Assessment Details Patient Name: Date of Service: BRELA [name], [name] 02/02/2021 1:15 PM Medical Record Number: [id number] Patient Account Number: [id number] Date of Birth/Sex: Treating RN: 07-Aug-1991 (30 y.o. Female) Zenaida Deed Primary Care Jaymz Traywick: [place] Other Clinician: Referring Johnpatrick Jenny: Treating Elen Acero/Extender: Marcial Pacas Weeks in Treatment: 0 Wound Status Wound Number: 1 Primary Etiology: Diabetic Wound/Ulcer of the Lower Extremity Wound Location: Right Metatarsal head fifth Wound Status: Open Wounding Event: Gradually Appeared Comorbid History: Deep Vein Thrombosis, Type I Diabetes Date Acquired: 11/23/2020 Weeks Of Treatment: 0 Clustered Wound: No Photos Wound Measurements Length: (cm) 0.3 Width: (cm) 0.3 Depth: (cm) 0.7 Area: (cm) 0.071 Volume: (cm) 0.049 % Reduction in Area: 0% % Reduction in Volume: 0% Epithelialization: None Tunneling: No Undermining: Yes Starting Position (o'clock): 12 Ending Position (o'clock): 12 Maximum Distance: (cm) 1 Wound  Description Classification: Grade 1 Wound Margin: Thickened Exudate Amount: Small Exudate Type: Serosanguineous Exudate Color: red, brown Foul Odor After Cleansing: No Slough/Fibrino No Wound Bed Granulation Amount: Large (67-100%) Exposed Structure Granulation Quality: Red Fascia Exposed: No Necrotic Amount: None Present (0%) Fat Layer (Subcutaneous Tissue) Exposed: Yes Tendon Exposed: No Muscle Exposed: No Joint Exposed: No Bone Exposed: No Treatment Notes Wound #1 (Metatarsal head fifth) Wound Laterality: Right Cleanser Wound Cleanser Discharge Instruction: Cleanse the wound with wound cleanser prior to applying a clean dressing using gauze sponges, not tissue or cotton balls. Peri-Wound Care Topical Primary Dressing KerraCel Ag Gelling Fiber Dressing, 4x5 in (silver alginate) Discharge Instruction: Apply silver alginate to wound bed as instructed Secondary Dressing Woven Gauze Sponge, Non-Sterile 4x4 in Discharge Instruction: Apply over primary dressing as directed. ABD Pad, 5x9 Discharge Instruction: Apply over primary dressing as directed. Optifoam Non-Adhesive Dressing, 4x4 in Discharge Instruction: Apply over primary dressing as directed. Foam donut over primary dressing for offload Secured With [employer], 4.5x3.1 (in/yd) Discharge Instruction: Secure with Kerlix as directed. 61M Medipore H Soft Cloth Surgical T 4 x 2 (in/yd) ape Discharge Instruction: Secure dressing with tape as directed. Compression Wrap Compression Stockings Add-Ons Electronic Signature(s) Signed: 02/02/2021 4:47:39 PM By: Karl Ito Signed: 02/02/2021 5:08:22 PM By: Zenaida Deed RN, BSN Entered By: Karl Ito on 02/02/2021 16:28:19 -------------------------------------------------------------------------------- Vitals Details Patient Name: Date of Service: BRELA [name], [name] 02/02/2021 1:15 PM Medical Record Number: [id number] Patient Account Number: [id number] Date  of Birth/Sex: Treating RN: [birth year] (30 y.o. Female) [name] Primary Care Rosendo Couser: [place] Other Clinician: Referring Fleur Audino: Treating Jiyah Torpey/Extender: Marcial Pacas Weeks in Treatment: 0 Vital Signs Time Taken: 13:39 Temperature (F): 97.9 Height (in): 65 Pulse (bpm): 125 Source: Stated Respiratory Rate (breaths/min): 18 Weight (lbs): 190 Blood Pressure (mmHg): 120/80 Source: Stated Capillary Blood Glucose (mg/dl): 086 Body Mass Index (BMI): 31.6 Reference Range: 80 - 120 mg / dl Notes glucose per pt report yesterday Electronic Signature(s) Signed: 02/02/2021 5:08:22 PM By: Zenaida Deed RN, BSN Entered By: Zenaida Deed on 02/02/2021 13:53:05

## 2021-02-09 ENCOUNTER — Encounter (HOSPITAL_BASED_OUTPATIENT_CLINIC_OR_DEPARTMENT_OTHER): Payer: Self-pay | Admitting: Internal Medicine

## 2021-02-09 ENCOUNTER — Other Ambulatory Visit: Payer: Self-pay

## 2021-02-10 NOTE — Progress Notes (Addendum)
Annette [name], Annette [name] ([id number]) Visit Report for 02/09/2021 Arrival Information Details Patient Name: Date of Service: [name], Annette [name] [name] 02/09/2021 10:00 A M Medical Record Number: [id number] Patient Account Number: [id number] Date of Birth/Sex: Treating RN: [birth year] (30 y.o. Annette [name], Millard.Loa Primary Care Elanora Quin: Annette [name] Other Clinician: Referring Cassidy Tabet: Treating Helton Oleson/Extender: Carroll Sage in Treatment: 1 Visit Information History Since Last Visit Added or deleted any medications: No Patient Arrived: Ambulatory Any new allergies or adverse reactions: No Arrival Time: 10:49 Had a fall or experienced change in No Accompanied By: self activities of daily living that may affect Transfer Assistance: None risk of falls: Patient Identification Verified: Yes Signs or symptoms of abuse/neglect since last visito No Secondary Verification Process Completed: Yes Hospitalized since last visit: No Patient Requires Transmission-Based Precautions: No Implantable device outside of the clinic excluding No Patient Has Alerts: No cellular tissue based products placed in the center since last visit: Has Dressing in Place as Prescribed: Yes Pain Present Now: No Electronic Signature(s) Signed: 02/10/2021 9:46:23 AM By: Karl Ito Entered By: Karl Ito on 02/09/2021 10:49:30 -------------------------------------------------------------------------------- Lower Extremity Assessment Details Patient Name: Date of Service: Annette [name], Annette [name] 02/09/2021 10:00 A M Medical Record Number: [id number] Patient Account Number: [id number] Date of Birth/Sex: Treating RN: Dec 10, 1990 (30 y.o. Annette [name], Annette [name] Primary Care Agnes Brightbill: Annette [name] Other Clinician: Referring Oracio Galen: Treating Deryk Bozman/Extender: Carroll Sage in Treatment: 1 Edema Assessment Assessed: Kyra Searles: No] Franne Forts: Yes] Edema: [Left: N] [Right: o] Calf Left:  Right: Point of Measurement: From Medial Instep 41.2 cm 40 cm Ankle Left: Right: Point of Measurement: From Medial Instep 22.2 cm 21 cm Vascular Assessment Pulses: Dorsalis Pedis Palpable: [Right:Yes] Electronic Signature(s) Signed: 02/09/2021 6:10:24 PM By: Shawn Stall Entered By: Shawn Stall on 02/09/2021 11:00:30 -------------------------------------------------------------------------------- Multi Wound Chart Details Patient Name: Date of Service: [name], Annette [name] 02/09/2021 10:00 A M Medical Record Number: [id number] Patient Account Number: [id number] Date of Birth/Sex: Treating RN: 11/02/1990 (30 y.o. Annette [name], Millard.Loa Primary Care Samiyah Stupka: Annette [name] Other Clinician: Referring Jonise Weightman: Treating Galina Haddox/Extender: Carroll Sage in Treatment: 1 Vital Signs Height(in): 65 Capillary Blood Glucose(mg/dl): 300 Weight(lbs): 923 Pulse(bpm): 118 Body Mass Index(BMI): 32 Blood Pressure(mmHg): 109/76 Temperature(F): 98.3 Respiratory Rate(breaths/min): 18 Photos: [1:No Photos Right Metatarsal head fifth] [N/A:N/A N/A] Wound Location: [1:Gradually Appeared] [N/A:N/A] Wounding Event: [1:Diabetic Wound/Ulcer of the Lower] [N/A:N/A] Primary Etiology: [1:Extremity Deep Vein Thrombosis, Type I] [N/A:N/A] Comorbid History: [1:Diabetes 11/23/2020] [N/A:N/A] Date Acquired: [1:1] [N/A:N/A] Weeks of Treatment: [1:Open] [N/A:N/A] Wound Status: [1:0.2x0.2x0.3] [N/A:N/A] Measurements L x W x D (cm) [1:0.031] [N/A:N/A] A (cm) : rea [1:0.009] [N/A:N/A] Volume (cm) : [1:56.30%] [N/A:N/A] % Reduction in A [1:rea: 81.60%] [N/A:N/A] % Reduction in Volume: [1:Grade 1] [N/A:N/A] Classification: [1:Small] [N/A:N/A] Exudate A mount: [1:Serosanguineous] [N/A:N/A] Exudate Type: [1:red, brown] [N/A:N/A] Exudate Color: [1:Thickened] [N/A:N/A] Wound Margin: [1:Large (67-100%)] [N/A:N/A] Granulation A mount: [1:Red] [N/A:N/A] Granulation Quality: [1:None  Present (0%)] [N/A:N/A] Necrotic A mount: [1:Fat Layer (Subcutaneous Tissue): Yes N/A] Exposed Structures: [1:Fascia: No Tendon: No Muscle: No Joint: No Bone: No Large (67-100%)] [N/A:N/A] Epithelialization: [1:Debridement - Selective/Open Wound N/A] Debridement: Pre-procedure Verification/Time Out 11:10 [N/A:N/A] Taken: [1:Lidocaine 4% Topical Solution] [N/A:N/A] Pain Control: [1:Callus] [N/A:N/A] Tissue Debrided: [1:Non-Viable Tissue] [N/A:N/A] Level: [1:2.25] [N/A:N/A] Debridement A (sq cm): [1:rea Curette] [N/A:N/A] Instrument: [1:Moderate] [N/A:N/A] Bleeding: [1:Pressure] [N/A:N/A] Hemostasis A chieved: [1:0] [N/A:N/A] Procedural Pain: [1:0] [N/A:N/A] Post Procedural Pain: [1:Procedure was tolerated well] [N/A:N/A] Debridement Treatment Response: [1:0.5x0.5x0.1] [N/A:N/A] Post Debridement Measurements L x W x D (  cm) [1:0.02] [N/A:N/A] Post Debridement Volume: (cm) [1:callous to periwound.] [N/A:N/A] Assessment Notes: [1:Debridement] [N/A:N/A] Procedures Performed: Treatment Notes Electronic Signature(s) Signed: 02/09/2021 4:50:17 PM By: Baltazar Najjar MD Signed: 02/09/2021 6:10:24 PM By: Shawn Stall Entered By: Baltazar Najjar on 02/09/2021 11:17:56 -------------------------------------------------------------------------------- Multi-Disciplinary Care Plan Details Patient Name: Date of Service: [name], Annette [name] 02/09/2021 10:00 A M Medical Record Number: [id number] Patient Account Number: [id number] Date of Birth/Sex: Treating RN: April 15, 1991 (30 y.o. Annette [name], Annette [name] Primary Care Kwaku Mostafa: Annette [name] Other Clinician: Referring Kathlynn Swofford: Treating Dailey Buccheri/Extender: Carroll Sage in Treatment: 1 Active Inactive Electronic Signature(s) Signed: 04/19/2021 9:22:46 PM By: Shawn Stall Signed: 10/21/2021 12:42:14 PM By: [name] RN Previous Signature: 02/09/2021 6:10:24 PM Version By: Shawn Stall Entered By: [name] on  03/03/2021 17:26:24 -------------------------------------------------------------------------------- Pain Assessment Details Patient Name: Date of Service: Annette [name], Annette [name] 02/09/2021 10:00 A M Medical Record Number: [id number] Patient Account Number: [id number] Date of Birth/Sex: Treating RN: 04/01/1991 (30 y.o. Annette [name], Annette [name] Primary Care Yuki Purves: Annette [name] Other Clinician: Referring Cardale Dorer: Treating Kelsea Mousel/Extender: Carroll Sage in Treatment: 1 Active Problems Location of Pain Severity and Description of Pain Patient Has Paino No Site Locations Pain Management and Medication Current Pain Management: Electronic Signature(s) Signed: 02/09/2021 6:10:24 PM By: Shawn Stall Signed: 02/10/2021 9:46:23 AM By: Karl Ito Entered By: Karl Ito on 02/09/2021 10:49:54 -------------------------------------------------------------------------------- Patient/Caregiver Education Details Patient Name: Date of Service: [name], Annette [name] 5/10/2022andnbsp10:00 A M Medical Record Number: [id number] Patient Account Number: [id number] Date of Birth/Gender: Treating RN: 1991-08-05 (30 y.o. Annette [name], Annette [name] Primary Care Physician: Annette [name] Other Clinician: Referring Physician: Treating Physician/Extender: Carroll Sage in Treatment: 1 Education Assessment Education Provided To: Patient Education Topics Provided Wound/Skin Impairment: Handouts: Skin Care Do's and Dont's Methods: Explain/Verbal Responses: Reinforcements needed Electronic Signature(s) Signed: 02/09/2021 6:10:24 PM By: Shawn Stall Entered By: Shawn Stall on 02/09/2021 11:14:17 -------------------------------------------------------------------------------- Wound Assessment Details Patient Name: Date of Service: Annette [name], Annette [name] 02/09/2021 10:00 A M Medical Record Number: [id number] Patient Account Number: [id number] Date of  Birth/Sex: Treating RN: May 25, 1991 (30 y.o. Annette [name], Millard.Loa Primary Care Lillie Portner: Annette [name] Other Clinician: Referring Sila Sarsfield: Treating Banesa Tristan/Extender: Carroll Sage in Treatment: 1 Wound Status Wound Number: 1 Primary Etiology: Diabetic Wound/Ulcer of the Lower Extremity Wound Location: Right Metatarsal head fifth Wound Status: Open Wounding Event: Gradually Appeared Comorbid History: Deep Vein Thrombosis, Type I Diabetes Date Acquired: 11/23/2020 Weeks Of Treatment: 1 Clustered Wound: No Photos Wound Measurements Length: (cm) 0.2 Width: (cm) 0.2 Depth: (cm) 0.3 Area: (cm) 0.031 Volume: (cm) 0.009 % Reduction in Area: 56.3% % Reduction in Volume: 81.6% Epithelialization: Large (67-100%) Tunneling: No Undermining: No Wound Description Classification: Grade 1 Wound Margin: Thickened Exudate Amount: Small Exudate Type: Serosanguineous Exudate Color: red, brown Foul Odor After Cleansing: No Slough/Fibrino No Wound Bed Granulation Amount: Large (67-100%) Exposed Structure Granulation Quality: Red Fascia Exposed: No Necrotic Amount: None Present (0%) Fat Layer (Subcutaneous Tissue) Exposed: Yes Tendon Exposed: No Muscle Exposed: No Joint Exposed: No Bone Exposed: No Assessment Notes callous to periwound. Electronic Signature(s) Signed: 02/09/2021 6:10:24 PM By: Shawn Stall Signed: 02/10/2021 9:46:23 AM By: Karl Ito Entered By: Karl Ito on 02/09/2021 16:29:58 -------------------------------------------------------------------------------- Vitals Details Patient Name: Date of Service: Annette [name], Annette [name] 02/09/2021 10:00 A M Medical Record Number: [id number] Patient Account Number: [id number] Date of Birth/Sex: Treating RN: 06-07-1991 (30 y.o. Arta Silence Primary Care Bartlomiej Jenkinson: Annette [name] Other Clinician: Referring Ketan Renz: Treating Carlon Davidson/Extender: Leanord Hawking  Renford Dills, Lysbeth Galas in  Treatment: 1 Vital Signs Time Taken: 10:49 Temperature (F): 98.3 Height (in): 65 Pulse (bpm): 118 Weight (lbs): 190 Respiratory Rate (breaths/min): 18 Body Mass Index (BMI): 31.6 Blood Pressure (mmHg): 109/76 Capillary Blood Glucose (mg/dl): 638 Reference Range: 80 - 120 mg / dl Electronic Signature(s) Signed: 02/10/2021 9:46:23 AM By: Karl Ito Entered By: Karl Ito on 02/09/2021 10:49:48

## 2021-02-11 NOTE — Progress Notes (Signed)
Annette, [name] ([id number]) Visit Report for 02/09/2021 Debridement Details Patient Name: Date of Service: Annette [name], [facility] [name] 02/09/2021 10:00 A M Medical Record Number: [id number] Patient Account Number: [id number] Date of Birth/Sex: Treating RN: Feb 25, 1991 (30 y.o. Arta Silence Primary Care Provider: Heber [place] Other Clinician: Referring Provider: Treating Provider/Extender: Marcial Pacas Weeks in Treatment: 1 Debridement Performed for Assessment: Wound #1 Right Metatarsal head fifth Performed By: Physician Maxwell Caul., MD Debridement Type: Debridement Severity of Tissue Pre Debridement: Limited to breakdown of skin Level of Consciousness (Pre-procedure): Awake and Alert Pre-procedure Verification/Time Out Yes - 11:10 Taken: Start Time: 11:11 Pain Control: Lidocaine 4% T opical Solution T Area Debrided (L x W): otal 1.5 (cm) x 1.5 (cm) = 2.25 (cm) Tissue and other material debrided: Non-Viable, Callus Level: Non-Viable Tissue Debridement Description: Selective/Open Wound Instrument: Curette Bleeding: Moderate Hemostasis Achieved: Pressure End Time: 11:15 Procedural Pain: 0 Post Procedural Pain: 0 Response to Treatment: Procedure was tolerated well Level of Consciousness (Post- Awake and Alert procedure): Post Debridement Measurements of Total Wound Length: (cm) 0.5 Width: (cm) 0.5 Depth: (cm) 0.1 Volume: (cm) 0.02 Character of Wound/Ulcer Post Debridement: Improved Severity of Tissue Post Debridement: Fat layer exposed Post Procedure Diagnosis Same as Pre-procedure Electronic Signature(s) Signed: 02/09/2021 4:50:17 PM By: Baltazar Najjar MD Signed: 02/09/2021 6:10:24 PM By: Shawn Stall Entered By: Baltazar Najjar on 02/09/2021 11:18:05 -------------------------------------------------------------------------------- HPI Details Patient Name: Date of Service: Annette [name], Annette [name] 02/09/2021 10:00 A M Medical Record Number:  [id number] Patient Account Number: [id number] Date of Birth/Sex: Treating RN: [birth year] (30 y.o. Arta Silence Primary Care Provider: Heber [place] Other Clinician: Referring Provider: Treating Provider/Extender: Marcial Pacas Weeks in Treatment: 1 History of Present Illness HPI Description: ADMISSION 02/02/2021 Is a patient who identifies herself as a type II diabetic having at one point been on pills before graduating to insulin. She came from [place] to this area 2 years ago. 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 She developed an area on her right fifth metatarsal head I believe some months ago. She was in urgent care on February with pain in the bottom of her foot noted to have calluses and referred to podiatry. They have not been doing anything particular to the wound she has not been offloading this or doing specific dressings. On arrival there was white material that came out of the wound our intake nurse was not convinced this was pus but we will certainly send that for culture. She has not had this x-rayed Past medical history includes type 2 diabetes with peripheral neuropathy, COVID-19 in 2020 at which point she had a DVT and pulmonary embolism with a DVT being on the right leg, ABIs in our clinic were 1.15 on the right and 1.10 on the left 02/09/21; right fifth metatarsal head wound thick callus last week which I removed I cultured underneath this because there was a small open wound in this area. This grew abundant E. coli and abundant Enterobacter as well as abundant Streptococcus agalactiae. I initially had her on Keflex we attempted to change her to Septra yesterday although we did not have the right phone number or pharmacy. The wound is much smaller this week Electronic Signature(s) Signed: 02/09/2021 4:50:17 PM By: Baltazar Najjar MD Entered By: Baltazar Najjar on 02/09/2021  11:21:38 -------------------------------------------------------------------------------- Physical Exam Details Patient Name: Date of Service: Annette [name], Annette [name] 02/09/2021 10:00 A M Medical Record  Number: [id number] Patient Account Number: [id number] Date of Birth/Sex: Treating RN: 05-Jul-1991 (30 y.o. Arta Silence Primary Care Provider: Heber [place] Other Clinician: Referring Provider: Treating Provider/Extender: Marcial Pacas Weeks in Treatment: 1 Constitutional Sitting or standing Blood Pressure is within target range for patient.. Pulse regular and within target range for patient.Marland Kitchen Respirations regular, non-labored and within target range.. Temperature is normal and within the target range for the patient.Marland Kitchen Appears in no distress. Notes Wound exam; hearing questions of the fifth metatarsal head after extensive removal of callus last week we are able to do identify a small wound this is barely open this week although once again there is callus around the wound edge which I removed with a #3 curette. There is no gross purulence. No obvious infection around the wound Electronic Signature(s) Signed: 02/09/2021 4:50:17 PM By: Baltazar Najjar MD Entered By: Baltazar Najjar on 02/09/2021 11:22:47 -------------------------------------------------------------------------------- Physician Orders Details Patient Name: Date of Service: Annette [name], Annette [name] 02/09/2021 10:00 A M Medical Record Number: [id number] Patient Account Number: [id number] Date of Birth/Sex: Treating RN: 1991/08/02 (30 y.o. Arta Silence Primary Care Provider: Heber [place] Other Clinician: Referring Provider: Treating Provider/Extender: Marcial Pacas Weeks in Treatment: 1 Verbal / Phone Orders: No Diagnosis Coding ICD-10 Coding Code Description E11.621 Type 2 diabetes mellitus with foot ulcer L97.518 Non-pressure chronic ulcer of other part of right foot with other  specified severity E11.42 Type 2 diabetes mellitus with diabetic polyneuropathy Follow-up Appointments Return Appointment in 1 week. Bathing/ Shower/ Hygiene May shower with protection but do not get wound dressing(s) wet. - on days dressing's changed you may shower and use soap and water Edema Control - Lymphedema / SCD / Other Elevate legs to the level of the heart or above for 30 minutes daily and/or when sitting, a frequency of: Avoid standing for long periods of time. Off-Loading Open toe surgical shoe to: - right foot ****clinic to apply felt in shoe to help aid in offloading area.*** Additional Orders / Instructions Other: - Stop taking the keflex and start bactrim DS today. Wound Treatment Wound #1 - Metatarsal head fifth Wound Laterality: Right Cleanser: Wound Cleanser Every Other Day/7 Days Discharge Instructions: Cleanse the wound with wound cleanser prior to applying a clean dressing using gauze sponges, not tissue or cotton balls. Prim Dressing: KerraCel Ag Gelling Fiber Dressing, 4x5 in (silver alginate) Every Other Day/7 Days ary Discharge Instructions: Apply silver alginate to wound bed as instructed Secondary Dressing: Woven Gauze Sponge, Non-Sterile 4x4 in Every Other Day/7 Days Discharge Instructions: Apply over primary dressing as directed. Secondary Dressing: ABD Pad, 5x9 Every Other Day/7 Days Discharge Instructions: Apply over primary dressing as directed. Secondary Dressing: Optifoam Non-Adhesive Dressing, 4x4 in Every Other Day/7 Days Discharge Instructions: Apply over primary dressing as directed. Foam donut over primary dressing for offload Secured With: [employer], 4.5x3.1 (in/yd) Every Other Day/7 Days Discharge Instructions: Secure with Kerlix as directed. Secured With: 40M Medipore H Soft Cloth Surgical T 4 x 2 (in/yd) Every Other Day/7 Days ape Discharge Instructions: Secure dressing with tape as directed. Patient Medications llergies: No Known  Allergies A Notifications Medication Indication Start End wound infection 02/09/2021 sulfamethoxazole-trimethoprim DOSE oral 800 mg-160 mg tablet - 1 tablet oral bid for 10 days (d/c Keflix) Electronic Signature(s) Signed: 02/09/2021 11:25:11 AM By: Baltazar Najjar MD Entered By: Baltazar Najjar on 02/09/2021 11:25:11 -------------------------------------------------------------------------------- Problem List Details Patient Name: Date of Service: Annette [name], Annette  [name] 02/09/2021 10:00 A M Medical Record Number: [id number] Patient Account Number: [id number] Date of Birth/Sex: Treating RN: November 03, 1990 (30 y.o. Arta Silence Primary Care Provider: Heber [place] Other Clinician: Referring Provider: Treating Provider/Extender: Gerre Scull, Salome Holmes Weeks in Treatment: 1 Active Problems ICD-10 Encounter Code Description Active Date MDM Diagnosis E11.621 Type 2 diabetes mellitus with foot ulcer 02/02/2021 No Yes L97.518 Non-pressure chronic ulcer of other part of right foot with other specified 02/02/2021 No Yes severity E11.42 Type 2 diabetes mellitus with diabetic polyneuropathy 02/02/2021 No Yes Inactive Problems Resolved Problems Electronic Signature(s) Signed: 02/09/2021 4:50:17 PM By: Baltazar Najjar MD Entered By: Baltazar Najjar on 02/09/2021 11:17:35 -------------------------------------------------------------------------------- SuperBill Details Patient Name: Date of Service: Annette [name], Annette [name] 02/09/2021 Medical Record Number: [id number] Patient Account Number: [id number] Date of Birth/Sex: Treating RN: 07-27-1991 (30 y.o. Arta Silence Primary Care Provider: Heber [place] Other Clinician: Referring Provider: Treating Provider/Extender: Marcial Pacas Weeks in Treatment: 1 Diagnosis Coding ICD-10 Codes Code Description E11.621 Type 2 diabetes mellitus with foot ulcer L97.518 Non-pressure chronic ulcer of other part of right  foot with other specified severity E11.42 Type 2 diabetes mellitus with diabetic polyneuropathy Facility Procedures CPT4 Code: [id number] Description: [phone] - DEBRIDE WOUND 1ST 20 SQ CM OR < ICD-10 Diagnosis Description L97.518 Non-pressure chronic ulcer of other part of right foot with other specified sever Modifier: ity Quantity: 1 Physician Procedures : CPT4 Code Description Modifier [id number] - WC PHYS DEBR WO ANESTH 20 SQ CM ICD-10 Diagnosis Description L97.518 Non-pressure chronic ulcer of other part of right foot with other specified severity Quantity: 1 Electronic Signature(s) Signed: 02/09/2021 6:10:24 PM By: Shawn Stall Signed: 02/11/2021 3:44:17 PM By: Baltazar Najjar MD Previous Signature: 02/09/2021 4:50:17 PM Version By: Baltazar Najjar MD Entered By: Shawn Stall on 02/09/2021 18:01:51

## 2021-02-16 ENCOUNTER — Encounter (HOSPITAL_BASED_OUTPATIENT_CLINIC_OR_DEPARTMENT_OTHER): Payer: Medicaid Other | Admitting: Internal Medicine

## 2021-02-16 ENCOUNTER — Other Ambulatory Visit: Payer: Self-pay

## 2021-02-16 NOTE — Progress Notes (Signed)
Annette, [name] ([id number]) Visit Report for 02/16/2021 HPI Details Patient Name: Date of Service: Annette [name] [name], [facility] TIA 02/16/2021 11:15 A M Medical Record Number: [id number] Patient Account Number: [id number] Date of Birth/Sex: Treating RN: Annette [name], Annette [name] ([name] y.o. Annette [name], Annette [name] Primary Care Provider: York Ram Other Clinician: Referring Provider: Treating Provider/Extender: Norval Morton in Treatment: 2 History of Present Illness HPI Description: ADMISSION 02/02/2021 Is a patient who identifies herself as a type II diabetic having at one point been on pills before graduating to insulin. She came from [place] to this area 2 years ago. 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 She developed an area on her right fifth metatarsal head I believe some months ago. She was in urgent care on February with pain in Annette bottom of her foot noted to have calluses and referred to podiatry. They have not been doing anything particular to Annette wound she has not been offloading this or doing specific dressings. On arrival there was white material that came out of Annette wound our intake nurse was not convinced this was pus but we will certainly send that for culture. She has not had this x-rayed Past medical history includes type 2 diabetes with peripheral neuropathy, COVID-[name] in 2020 at which point she had a DVT and pulmonary embolism with a DVT being on Annette right leg, ABIs in our clinic were 1.15 on Annette right and 1.10 on Annette left 02/09/21; right fifth metatarsal head wound thick callus last week which I removed I cultured underneath this because there was a small open wound in this area. This grew abundant E. coli and abundant Enterobacter as well as abundant Streptococcus agalactiae. I initially had her on Keflex we attempted to change her to Septra yesterday although we did not have Annette right phone number or pharmacy. Annette wound is much smaller this week 5/17/3  visit Annette area is closed on Annette right fifth metatarsal head although she still has significant amounts of callus. Works long hours at [employer] and another job. She is back on her feet and sneakers. She has no open wounds Culture that I did from 5/3 grew abundant E. coli Enterobacter CLO ACOA and abundant strep. Not all covered well by Annette Keflex I initially put her on. I substituted trimethoprim sulfamethoxazole 1 p.o. twice daily for 7 days she is still taking these which I cannot really explain. In any case she has about another 2 days Electronic Signature(s) Signed: 02/16/2021 4:[name]:43 PM By: Linton Ham MD Entered By: Linton Ham on 02/16/2021 12:20:46 -------------------------------------------------------------------------------- Physical Exam Details Patient Name: Date of Service: Annette [name], [place] TIA 02/16/2021 11:15 A M Medical Record Number: [id number] Patient Account Number: [id number] Date of Birth/Sex: Treating RN: Annette 14, Annette [name] (30 y.o. Annette [name], Annette [name] Primary Care Provider: York Ram Other Clinician: Referring Provider: Treating Provider/Extender: Norval Morton in Treatment: 2 Constitutional Sitting or standing Blood Pressure is within target range for patient.. Pulse regular and within target range for patient.Marland Kitchen Respirations regular, non-labored and within target range.. Temperature is normal and within Annette target range for Annette patient.Marland Kitchen Appears in no distress. Notes Wound exam; area in question is on Annette fifth met head. On Annette right. This is closed over. Still thick callus developing in this area. I used a #3 curette to remove this there is no open wound in Annette area where Annette wound was last week. Electronic Signature(s) Signed: 02/16/2021 4:[name]:43 PM By: Linton Ham MD Entered By: Linton Ham on  02/16/2021 12:[name]:24 -------------------------------------------------------------------------------- Physician Orders Details Patient  Name: Date of Service: Annette [name] TIA 02/16/2021 11:15 A M Medical Record Number: [id number] Patient Account Number: [id number] Date of Birth/Sex: Treating RN: Annette [name], Annette [name] (30 y.o. Annette [name], Annette [name] Primary Care Provider: York Ram Other Clinician: Referring Provider: Treating Provider/Extender: Norval Morton in Treatment: 2 Verbal / Phone Orders: No Diagnosis Coding Follow-up Appointments Other: - No need to follow up!!:)Call us if you have any future concerns!!!:):):):) Discharge From [facility] Discharge from [place] Non Wound Condition Protect area with: - Use callous pads on areas of your feet that have a lot of pressure...be sure to protect feet with extra foam/padding while working!!!! Engineer, maintenance) Signed: 02/16/2021 4:[name]:43 PM By: Linton Ham MD Signed: 02/16/2021 5:09:03 PM By: Rhae Hammock RN Entered By: Rhae Hammock on 02/16/2021 12:04:04 -------------------------------------------------------------------------------- Problem List Details Patient Name: Date of Service: Annette [name], Annette [name] TIA 02/16/2021 11:15 A M Medical Record Number: [id number] Patient Account Number: [id number] Date of Birth/Sex: Treating RN: Annette [name], Annette [name] (30 y.o. Annette [name], Annette [name] Primary Care Provider: York Ram Other Clinician: Referring Provider: Treating Provider/Extender: Gaston Islam, Janie Morning in Treatment: 2 Active Problems ICD-10 Encounter Code Description Active Date MDM Diagnosis E11.621 Type 2 diabetes mellitus with foot ulcer 02/02/2021 No Yes L97.518 Non-pressure chronic ulcer of other part of right foot with other specified 02/02/2021 No Yes severity E11.42 Type 2 diabetes mellitus with diabetic polyneuropathy 02/02/2021 No Yes Inactive Problems Resolved Problems Electronic Signature(s) Signed: 02/16/2021 4:[name]:43 PM By: Linton Ham MD Entered By: Linton Ham on 02/16/2021  12:14:22 -------------------------------------------------------------------------------- Progress Note Details Patient Name: Date of Service: Annette [name], Annette [name] TIA 02/16/2021 11:15 A M Medical Record Number: [id number] Patient Account Number: [id number] Date of Birth/Sex: Treating RN: Annette [name], Annette [name] (30 y.o. Annette [name], Annette [name] Primary Care Provider: York Ram Other Clinician: Referring Provider: Treating Provider/Extender: Norval Morton in Treatment: 2 Subjective History of Present Illness (HPI) ADMISSION 02/02/2021 Is a patient who identifies herself as a type II diabetic having at one point been on pills before graduating to insulin. She came from [place] to this area 2 years ago. 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 She developed an area on her right fifth metatarsal head I believe some months ago. She was in urgent care on February with pain in Annette bottom of her foot noted to have calluses and referred to podiatry. They have not been doing anything particular to Annette wound she has not been offloading this or doing specific dressings. On arrival there was white material that came out of Annette wound our intake nurse was not convinced this was pus but we will certainly send that for culture. She has not had this x-rayed Past medical history includes type 2 diabetes with peripheral neuropathy, COVID-[name] in 2020 at which point she had a DVT and pulmonary embolism with a DVT being on Annette right leg, ABIs in our clinic were 1.15 on Annette right and 1.10 on Annette left 02/09/21; right fifth metatarsal head wound thick callus last week which I removed I cultured underneath this because there was a small open wound in this area. This grew abundant E. coli and abundant Enterobacter as well as abundant Streptococcus agalactiae. I initially had her on Keflex we attempted to change her to Septra yesterday although we did not have Annette right phone number or  pharmacy. Annette wound is much smaller this week 5/17/3 visit Annette area is closed on Annette right fifth  metatarsal head although she still has significant amounts of callus. Works long hours at [employer] and another job. She is back on her feet and sneakers. She has no open wounds Culture that I did from 5/3 grew abundant E. coli Enterobacter CLO ACOA and abundant strep. Not all covered well by Annette Keflex I initially put her on. I substituted trimethoprim sulfamethoxazole 1 p.o. twice daily for 7 days she is still taking these which I cannot really explain. In any case she has about another 2 days Objective Constitutional Sitting or standing Blood Pressure is within target range for patient.. Pulse regular and within target range for patient.Marland Kitchen Respirations regular, non-labored and within target range.. Temperature is normal and within Annette target range for Annette patient.Marland Kitchen Appears in no distress. Vitals Time Taken: 11:39 AM, Height: 65 in, Weight: 190 lbs, BMI: 31.6, Temperature: 98.8 F, Pulse: 112 bpm, Respiratory Rate: [name] breaths/min, Blood Pressure: 104/69 mmHg, Capillary Blood Glucose: 427 mg/dl. General Notes: glucose per pt report General Notes: Wound exam; area in question is on Annette fifth met head. On Annette right. This is closed over. Still thick callus developing in this area. I used a #3 curette to remove this there is no open wound in Annette area where Annette wound was last week. Integumentary (Hair, Skin) Wound #1 status is Healed - Epithelialized. Original cause of wound was Gradually Appeared. Annette date acquired was: 11/23/2020. Annette wound has been in treatment 2 weeks. Annette wound is located on Annette Right Metatarsal head fifth. Annette wound measures 0cm length x 0cm width x 0cm depth; 0cm^2 area and 0cm^3 volume. There is Fat Layer (Subcutaneous Tissue) exposed. There is no undermining noted. There is a small amount of serosanguineous drainage noted. Annette wound margin is thickened. There is large (67-100%) red  granulation within Annette wound bed. There is no necrotic tissue within Annette wound bed. Assessment Active Problems ICD-10 Type 2 diabetes mellitus with foot ulcer Non-pressure chronic ulcer of other part of right foot with other specified severity Type 2 diabetes mellitus with diabetic polyneuropathy Plan Follow-up Appointments: Other: - No need to follow up!!:)Call us if you have any future concerns!!!:):):):) Discharge From [facility] Services: Discharge from [place] Non Wound Condition: Protect area with: - Use callous pads on areas of your feet that have a lot of pressure...be sure to protect feet with extra foam/padding while working!!!! 1. Annette area is closed 2. I went over offloading this wound callus pads, foam. Insoles for her shoes etc. etc. I am not certain how much of this she will be compliant with. 3. She can come back to see [place] if there is more openings. Hopefully not Electronic Signature(s) Signed: 02/16/2021 4:[name]:43 PM By: Linton Ham MD Entered By: Linton Ham on 02/16/2021 12:34:45 -------------------------------------------------------------------------------- SuperBill Details Patient Name: Date of Service: Annette [name], Annette [name] TIA 02/16/2021 Medical Record Number: [id number] Patient Account Number: [id number] Date of Birth/Sex: Treating RN: [birth year]Annette [name] (30 y.o. Annette [name], Annette [name] Primary Care Provider: York Ram Other Clinician: Referring Provider: Treating Provider/Extender: Norval Morton in Treatment: 2 Diagnosis Coding ICD-10 Codes Code Description [phone] Type 2 diabetes mellitus with foot ulcer L97.518 Non-pressure chronic ulcer of other part of right foot with other specified severity E11.42 Type 2 diabetes mellitus with diabetic polyneuropathy Physician Procedures : CPT4 Code Description Modifier [id number] - WC PHYS LEVEL 2 - EST PT ICD-10 Diagnosis Description E11.621 Type 2 diabetes mellitus with foot ulcer L97.518  Non-pressure chronic ulcer of other part of right foot  with other specified severity Quantity: 1 Electronic Signature(s) Signed: 02/16/2021 4:[name]:43 PM By: Linton Ham MD Entered By: Linton Ham on 02/16/2021 12:35:07

## 2021-02-17 NOTE — Progress Notes (Signed)
Annette [name], Annette [name] ([id number]) Visit Report for 02/16/2021 Arrival Information Details Patient Name: Date of Service: Annette [name], Annette [name] [name] 02/16/2021 11:15 A M Medical Record Number: [id number] Patient Account Number: [id number] Date of Birth/Sex: Treating RN: Feb 21, 1991 (30 y.o. Annette [name] Primary Care Annette [name]: Annette [name] Other Clinician: Referring Annette [name]: Treating Bostyn Bogie/Extender: Annette [name] in Treatment: 2 Visit Information History Since Last Visit Added or deleted any medications: No Patient Arrived: Ambulatory Any new allergies or adverse reactions: No Arrival Time: 11:39 Had a fall or experienced change in No Accompanied By: alone activities of daily living that may affect Transfer Assistance: None risk of falls: Patient Identification Verified: Yes Signs or symptoms of abuse/neglect since No Secondary Verification Process Completed: Yes last visito Patient Requires Transmission-Based Precautions: No Hospitalized since last visit: No Patient Has Alerts: No Implantable device outside of the clinic No excluding cellular tissue based products placed in the center since last visit: Has Dressing in Place as Prescribed: Yes Has Footwear/Offloading in Place as Yes Prescribed: Right: Surgical Shoe with Pressure Relief Insole Pain Present Now: No Electronic Signature(s) Signed: 02/17/2021 6:30:22 PM By: Annette Abts RN, BSN Entered By: Annette [name] on 02/16/2021 11:43:13 -------------------------------------------------------------------------------- Encounter Discharge Information Details Patient Name: Date of Service: Annette [name], Annette [name] 02/16/2021 11:15 A M Medical Record Number: [id number] Patient Account Number: [id number] Date of Birth/Sex: Treating RN: 03-25-1991 (30 y.o. Roel Cluck Primary Care Marget Outten: Annette [name] Other Clinician: Referring Annette [name]: Treating Annette [name]/Extender: Annette [name] in Treatment: 2 Encounter Discharge Information Items Discharge Condition: Stable Ambulatory Status: Ambulatory Discharge Destination: Home Transportation: Private Auto Schedule Follow-up Appointment: Yes Clinical Summary of Care: Provided on 02/16/2021 Form Type Recipient Paper Patient Patient Electronic Signature(s) Signed: 02/16/2021 1:16:15 PM By: Annette [name] Entered By: Annette [name] on 02/16/2021 13:16:14 -------------------------------------------------------------------------------- Lower Extremity Assessment Details Patient Name: Date of Service: Annette [name], Annette [name] [name] 02/16/2021 11:15 A M Medical Record Number: [id number] Patient Account Number: [id number] Date of Birth/Sex: Treating RN: 23-Sep-1991 (30 y.o. Annette [name] Primary Care Nathaniel Yaden: Annette [name] Other Clinician: Referring Joelle Flessner: Treating Valia Wingard/Extender: Annette [name] in Treatment: 2 Edema Assessment Assessed: Annette [name]: No] Annette [name]: No] Edema: [Left: N] [Right: o] Calf Left: Right: Point of Measurement: From Medial Instep 41.2 cm 40 cm Ankle Left: Right: Point of Measurement: From Medial Instep 22.2 cm 21 cm Vascular Assessment Pulses: Dorsalis Pedis Palpable: [Right:Yes] Electronic Signature(s) Signed: 02/17/2021 6:30:22 PM By: Annette Abts RN, BSN Entered By: Annette [name] on 02/16/2021 11:43:45 -------------------------------------------------------------------------------- Multi Wound Chart Details Patient Name: Date of Service: Annette [name], Annette [name] 02/16/2021 11:15 A M Medical Record Number: [id number] Patient Account Number: [id number] Date of Birth/Sex: Treating RN: [birth year] (30 y.o. Annette [name], Annette [name] Primary Care Annette [name]: Annette [name] Other Clinician: Referring Annette [name]: Treating Annette [name]/Extender: Annette [name] in Treatment: 2 Vital Signs Height(in): 65 Capillary Blood Glucose(mg/dl): 836 Weight(lbs):  629 Pulse(bpm): 112 Body Mass Index(BMI): 32 Blood Pressure(mmHg): 104/69 Temperature(F): 98.8 Respiratory Rate(breaths/min): 16 Photos: [1:No Photos Right Metatarsal head fifth] [N/A:N/A N/A] Wound Location: [1:Gradually Appeared] [N/A:N/A] Wounding Event: [1:Diabetic Wound/Ulcer of the Lower] [N/A:N/A] Primary Etiology: [1:Extremity Deep Vein Thrombosis, Type I] [N/A:N/A] Comorbid History: [1:Diabetes 11/23/2020] [N/A:N/A] Date Acquired: [1:2] [N/A:N/A] Weeks of Treatment: [1:Healed - Epithelialized] [N/A:N/A] Wound Status: [1:0x0x0] [N/A:N/A] Measurements L x W x D (cm) [1:0] [N/A:N/A] A (cm) : rea [1:0] [N/A:N/A] Volume (cm) : [1:100.00%] [N/A:N/A] % Reduction in A rea: [1:100.00%] [N/A:N/A] % Reduction in Volume: [1:Grade 1] [N/A:N/A] Classification: [1:Small] [N/A:N/A] Exudate A  mount: [1:Serosanguineous] [N/A:N/A] Exudate Type: [1:red, brown] [N/A:N/A] Exudate Color: [1:Thickened] [N/A:N/A] Wound Margin: [1:Large (67-100%)] [N/A:N/A] Granulation A mount: [1:Red] [N/A:N/A] Granulation Quality: [1:None Present (0%)] [N/A:N/A] Necrotic A mount: [1:Fat Layer (Subcutaneous Tissue): Yes N/A] Exposed Structures: [1:Fascia: No Tendon: No Muscle: No Joint: No Bone: No Large (67-100%)] [N/A:N/A] Treatment Notes Electronic Signature(s) Signed: 02/16/2021 4:16:43 PM By: Annette Najjar MD Signed: 02/16/2021 5:09:03 PM By: Annette Mu RN Entered By: Annette [name] on 02/16/2021 12:14:41 -------------------------------------------------------------------------------- Pain Assessment Details Patient Name: Date of Service: Annette [name], Annette [name] 02/16/2021 11:15 A M Medical Record Number: [id number] Patient Account Number: [id number] Date of Birth/Sex: Treating RN: [birth year] (30 y.o. Annette [name] Primary Care Annette [name]: Annette [name] Other Clinician: Referring Annette [name]: Treating Annette [name]/Extender: Annette [name] in Treatment: 2 Active  Problems Location of Pain Severity and Description of Pain Patient Has Paino No Site Locations Pain Management and Medication Current Pain Management: Electronic Signature(s) Signed: 02/17/2021 6:30:22 PM By: Annette Abts RN, BSN Entered By: Annette [name] on 02/16/2021 11:43:40 -------------------------------------------------------------------------------- Wound Assessment Details Patient Name: Date of Service: Annette [name], Annette [name] 02/16/2021 11:15 A M Medical Record Number: [id number] Patient Account Number: [id number] Date of Birth/Sex: Treating RN: [birth year] (30 y.o. Annette [name], Annette [name] Primary Care Wakisha Alberts: Annette [name] Other Clinician: Referring Jalene Demo: Treating Deyci Gesell/Extender: Annette [name] in Treatment: 2 Wound Status Wound Number: 1 Primary Etiology: Diabetic Wound/Ulcer of the Lower Extremity Wound Location: Right Metatarsal head fifth Wound Status: Healed - Epithelialized Wounding Event: Gradually Appeared Comorbid History: Deep Vein Thrombosis, Type I Diabetes Date Acquired: 11/23/2020 Weeks Of Treatment: 2 Clustered Wound: No Photos Wound Measurements Length: (cm) Width: (cm) Depth: (cm) Area: (cm) Volume: (cm) 0 % Reduction in Area: 100% 0 % Reduction in Volume: 100% 0 Epithelialization: Large (67-100%) 0 Undermining: No 0 Wound Description Classification: Grade 1 Wound Margin: Thickened Exudate Amount: Small Exudate Type: Serosanguineous Exudate Color: red, brown Foul Odor After Cleansing: No Slough/Fibrino No Wound Bed Granulation Amount: Large (67-100%) Exposed Structure Granulation Quality: Red Fascia Exposed: No Necrotic Amount: None Present (0%) Fat Layer (Subcutaneous Tissue) Exposed: Yes Tendon Exposed: No Muscle Exposed: No Joint Exposed: No Bone Exposed: No Treatment Notes Wound #1 (Metatarsal head fifth) Wound Laterality: Right Cleanser Peri-Wound Care Topical Primary Dressing Secondary  Dressing Secured With Compression Wrap Compression Stockings Add-Ons Electronic Signature(s) Signed: 02/16/2021 5:09:03 PM By: Annette Mu RN Signed: 02/17/2021 11:07:31 AM By: Karl Ito Entered By: Karl Ito on 02/16/2021 16:32:38 -------------------------------------------------------------------------------- Vitals Details Patient Name: Date of Service: Annette [name], Annette [name] 02/16/2021 11:15 A M Medical Record Number: [id number] Patient Account Number: [id number] Date of Birth/Sex: Treating RN: [birth year] (30 y.o. Annette [name] Primary Care Keven Osborn: Annette [name] Other Clinician: Referring Angelea Penny: Treating Benett Swoyer/Extender: Annette [name] in Treatment: 2 Vital Signs Time Taken: 11:39 Temperature (F): 98.8 Height (in): 65 Pulse (bpm): 112 Weight (lbs): 190 Respiratory Rate (breaths/min): 16 Body Mass Index (BMI): 31.6 Blood Pressure (mmHg): 104/69 Capillary Blood Glucose (mg/dl): 355 Reference Range: 80 - 120 mg / dl Notes glucose per pt report Electronic Signature(s) Signed: 02/17/2021 6:30:22 PM By: Annette Abts RN, BSN Entered By: Annette [name] on 02/16/2021 11:43:35

## 2021-03-11 ENCOUNTER — Encounter (HOSPITAL_COMMUNITY): Payer: Self-pay | Admitting: Emergency Medicine

## 2021-03-11 ENCOUNTER — Ambulatory Visit (HOSPITAL_COMMUNITY)
Admission: EM | Admit: 2021-03-11 | Discharge: 2021-03-11 | Disposition: A | Discharge status: Home / Self Care | Payer: Medicaid Other

## 2021-03-11 ENCOUNTER — Encounter (HOSPITAL_COMMUNITY): Payer: Self-pay

## 2021-03-11 ENCOUNTER — Observation Stay (HOSPITAL_COMMUNITY): Payer: Medicaid Other

## 2021-03-11 ENCOUNTER — Other Ambulatory Visit: Payer: Self-pay

## 2021-03-11 ENCOUNTER — Observation Stay (HOSPITAL_COMMUNITY)
Admission: EM | Admit: 2021-03-11 | Discharge: 2021-03-12 | Disposition: A | Discharge status: Home / Self Care | Payer: Medicaid Other | Attending: Internal Medicine | Admitting: Internal Medicine

## 2021-03-11 DIAGNOSIS — E119 Type 2 diabetes mellitus without complications: Secondary | ICD-10-CM | POA: Insufficient documentation

## 2021-03-11 DIAGNOSIS — K0889 Other specified disorders of teeth and supporting structures: Secondary | ICD-10-CM

## 2021-03-11 DIAGNOSIS — Z8616 Personal history of COVID-19: Secondary | ICD-10-CM | POA: Insufficient documentation

## 2021-03-11 DIAGNOSIS — Z20822 Contact with and (suspected) exposure to covid-19: Secondary | ICD-10-CM | POA: Insufficient documentation

## 2021-03-11 DIAGNOSIS — K047 Periapical abscess without sinus: Secondary | ICD-10-CM

## 2021-03-11 DIAGNOSIS — Z794 Long term (current) use of insulin: Secondary | ICD-10-CM

## 2021-03-11 DIAGNOSIS — Y900 Blood alcohol level of less than 20 mg/100 ml: Secondary | ICD-10-CM | POA: Insufficient documentation

## 2021-03-11 DIAGNOSIS — T391X1A Poisoning by 4-Aminophenol derivatives, accidental (unintentional), initial encounter: Principal | ICD-10-CM | POA: Diagnosis present

## 2021-03-11 LAB — URINALYSIS, ROUTINE W REFLEX MICROSCOPIC
Bilirubin Urine: NEGATIVE
Glucose, UA: >=500 mg/dL — AB
Ketones, ur: 80 mg/dL — AB
Nitrite: NEGATIVE
Protein, ur: 30 mg/dL — AB
Specific Gravity, Urine: 1.044 — ABNORMAL HIGH (ref 1.005–1.030)
pH: 6 (ref 5.0–8.0)

## 2021-03-11 LAB — BLOOD GAS, VENOUS
Acid-base deficit: 3.5 mmol/L — ABNORMAL HIGH (ref 0.0–2.0)
Bicarbonate: 21.2 mmol/L (ref 20.0–28.0)
Drawn by: 5790
FIO2: 100
O2 Saturation: 67.2 %
Patient temperature: 36.5
pCO2, Ven: 39.9 mmHg — ABNORMAL LOW (ref 44.0–60.0)
pH, Ven: 7.346 (ref 7.250–7.430)
pO2, Ven: 37.6 mmHg (ref 32.0–45.0)

## 2021-03-11 LAB — CBC WITH DIFFERENTIAL/PLATELET
Abs Immature Granulocytes: 0.01 10*3/uL (ref 0.00–0.07)
Basophils Absolute: 0 10*3/uL (ref 0.0–0.1)
Basophils Relative: 1 %
Eosinophils Absolute: 0.1 10*3/uL (ref 0.0–0.5)
Eosinophils Relative: 1 %
HCT: 46.7 % — ABNORMAL HIGH (ref 36.0–46.0)
Hemoglobin: 15.4 g/dL — ABNORMAL HIGH (ref 12.0–15.0)
Immature Granulocytes: 0 %
Lymphocytes Relative: 46 %
Lymphs Abs: 2.6 10*3/uL (ref 0.7–4.0)
MCH: 28.8 pg (ref 26.0–34.0)
MCHC: 33 g/dL (ref 30.0–36.0)
MCV: 87.3 fL (ref 80.0–100.0)
Monocytes Absolute: 0.3 10*3/uL (ref 0.1–1.0)
Monocytes Relative: 5 %
Neutro Abs: 2.8 10*3/uL (ref 1.7–7.7)
Neutrophils Relative %: 47 %
Platelets: 355 10*3/uL (ref 150–400)
RBC: 5.35 MIL/uL — ABNORMAL HIGH (ref 3.87–5.11)
RDW: 12.4 % (ref 11.5–15.5)
WBC: 5.7 10*3/uL (ref 4.0–10.5)
nRBC: 0 % (ref 0.0–0.2)

## 2021-03-11 LAB — SALICYLATE LEVEL: Salicylate Lvl: 7.2 mg/dL (ref 7.0–30.0)

## 2021-03-11 LAB — COMPREHENSIVE METABOLIC PANEL
ALT: 16 U/L (ref 0–44)
AST: 17 U/L (ref 15–41)
Albumin: 3.3 g/dL — ABNORMAL LOW (ref 3.5–5.0)
Alkaline Phosphatase: 71 U/L (ref 38–126)
Anion gap: 10 (ref 5–15)
BUN: 7 mg/dL (ref 6–20)
CO2: 20 mmol/L — ABNORMAL LOW (ref 22–32)
Calcium: 8.6 mg/dL — ABNORMAL LOW (ref 8.9–10.3)
Chloride: 106 mmol/L (ref 98–111)
Creatinine, Ser: 0.67 mg/dL (ref 0.44–1.00)
GFR, Estimated: >60 mL/min (ref >60)
Glucose, Bld: 281 mg/dL — ABNORMAL HIGH (ref 70–99)
Potassium: 3.5 mmol/L (ref 3.5–5.1)
Sodium: 136 mmol/L (ref 135–145)
Total Bilirubin: 0.4 mg/dL (ref 0.3–1.2)
Total Protein: 7.5 g/dL (ref 6.5–8.1)

## 2021-03-11 LAB — I-STAT BETA HCG BLOOD, ED (MC, WL, AP ONLY): I-stat hCG, quantitative: <5 m[IU]/mL (ref <5)

## 2021-03-11 LAB — RESP PANEL BY RT-PCR (FLU A&B, COVID) ARPGX2
Influenza A by PCR: NEGATIVE
Influenza B by PCR: NEGATIVE
SARS Coronavirus 2 by RT PCR: NEGATIVE

## 2021-03-11 LAB — GLUCOSE, CAPILLARY: Glucose-Capillary: 328 mg/dL — ABNORMAL HIGH (ref 70–99)

## 2021-03-11 LAB — ETHANOL: Alcohol, Ethyl (B): <10 mg/dL (ref <10)

## 2021-03-11 LAB — ACETAMINOPHEN LEVEL: Acetaminophen (Tylenol), Serum: 45 ug/mL — ABNORMAL HIGH (ref 10–30)

## 2021-03-11 MED ORDER — OXYCODONE HCL 5 MG PO TABS: 5.0000 mg | ORAL_TABLET | ORAL | Status: DC | PRN

## 2021-03-11 MED ORDER — FENTANYL CITRATE (PF) 100 MCG/2ML IJ SOLN
50.0000 ug | Freq: Once | INTRAMUSCULAR | Status: AC
Start: 2021-03-11 — End: 2021-03-11
  Administered 2021-03-11: 50 ug via INTRAVENOUS
  Filled 2021-03-11: qty 2

## 2021-03-11 MED ORDER — INSULIN ASPART 100 UNIT/ML IJ SOLN
0.0000 [IU] | Freq: Every day | INTRAMUSCULAR | Status: DC
  Administered 2021-03-11: 4 [IU] via SUBCUTANEOUS

## 2021-03-11 MED ORDER — ONDANSETRON HCL 4 MG/2ML IJ SOLN
4.0000 mg | Freq: Once | INTRAMUSCULAR | Status: AC
  Administered 2021-03-11: 4 mg via INTRAVENOUS
  Filled 2021-03-11: qty 2

## 2021-03-11 MED ORDER — ENOXAPARIN SODIUM 40 MG/0.4ML IJ SOSY
40.0000 mg | PREFILLED_SYRINGE | Freq: Every day | INTRAMUSCULAR | Status: DC
  Administered 2021-03-11: 40 mg via SUBCUTANEOUS
  Filled 2021-03-11: qty 0.4

## 2021-03-11 MED ORDER — CLINDAMYCIN PHOSPHATE 600 MG/50ML IV SOLN
600.0000 mg | Freq: Three times a day (TID) | INTRAVENOUS | Status: DC
  Administered 2021-03-12 (×2): 600 mg via INTRAVENOUS
  Filled 2021-03-11 (×3): qty 50

## 2021-03-11 MED ORDER — INSULIN ASPART 100 UNIT/ML IJ SOLN
0.0000 [IU] | Freq: Three times a day (TID) | INTRAMUSCULAR | Status: DC
Start: 2021-03-12 — End: 2021-03-12

## 2021-03-11 MED ORDER — DEXTROSE 5 % IV SOLN
15.0000 mg/kg/h | INTRAVENOUS | Status: DC
  Administered 2021-03-11: 15 mg/kg/h via INTRAVENOUS
  Filled 2021-03-11 (×2): qty 120

## 2021-03-11 MED ORDER — SODIUM CHLORIDE 0.9 % IV SOLN
12.5000 mg | Freq: Four times a day (QID) | INTRAVENOUS | Status: DC | PRN
  Filled 2021-03-11 (×2): qty 0.5

## 2021-03-11 MED ORDER — MORPHINE SULFATE (PF) 2 MG/ML IV SOLN
1.0000 mg | INTRAVENOUS | Status: DC | PRN
Start: 2021-03-11 — End: 2021-03-12
  Administered 2021-03-12: 1 mg via INTRAVENOUS
  Filled 2021-03-11: qty 1

## 2021-03-11 MED ORDER — ONDANSETRON HCL 4 MG PO TABS: 4.0000 mg | ORAL_TABLET | Freq: Four times a day (QID) | ORAL | Status: DC | PRN

## 2021-03-11 MED ORDER — ONDANSETRON 4 MG PO TBDP
4.0000 mg | ORAL_TABLET | Freq: Once | ORAL | Status: AC
  Administered 2021-03-11: 4 mg via ORAL
  Filled 2021-03-11: qty 1

## 2021-03-11 MED ORDER — LACTATED RINGERS IV BOLUS
1000.0000 mL | Freq: Once | INTRAVENOUS | Status: AC
  Administered 2021-03-11: 1000 mL via INTRAVENOUS

## 2021-03-11 MED ORDER — ONDANSETRON HCL 4 MG/2ML IJ SOLN
4.0000 mg | Freq: Four times a day (QID) | INTRAMUSCULAR | Status: DC | PRN
  Administered 2021-03-11: 4 mg via INTRAVENOUS
  Filled 2021-03-11: qty 2

## 2021-03-11 MED ORDER — CLINDAMYCIN PHOSPHATE 600 MG/50ML IV SOLN
600.0000 mg | Freq: Once | INTRAVENOUS | Status: DC
  Filled 2021-03-11: qty 50

## 2021-03-11 MED ORDER — ACETYLCYSTEINE LOAD VIA INFUSION
150.0000 mg/kg | Freq: Once | INTRAVENOUS | Status: AC
  Administered 2021-03-11: 14295 mg via INTRAVENOUS
  Filled 2021-03-11: qty 358

## 2021-03-11 NOTE — ED Notes (Signed)
Pt left emergency department. Pt stated "I'm looking for the girl to cut in my mouth." Told pt that she should go back to hallway 1 and I will talk to her nurse. Pt refused. Asked pt if she wanted to sign paperwork stating she was leaving against medical advice, she stated "No, I'm leaving." and walked into the lobby and outside the ED.

## 2021-03-11 NOTE — ED Notes (Signed)
Patient continued to vomit so violently that she dislodged her IV. Attempted to start another IV x2 - unsuccessful.

## 2021-03-11 NOTE — ED Notes (Signed)
Attempted blood draw with the re-start of IV, unsuccessful. Will hydrate pt and re-attempt.

## 2021-03-11 NOTE — ED Notes (Signed)
Attempted to call report; advised that Charge RN to review chart and then nurse will call back for report.

## 2021-03-11 NOTE — ED Notes (Signed)
Called pt for room placement. No answer X2

## 2021-03-11 NOTE — ED Notes (Signed)
The patient was transported to Xray.

## 2021-03-11 NOTE — ED Notes (Signed)
Patient has been actively vomiting/dry heaving since arriving to room. Medication given with no relief. Contacted Provider for additional orders.

## 2021-03-11 NOTE — ED Notes (Signed)
Pt returned. Now in room 14.

## 2021-03-11 NOTE — ED Provider Notes (Signed)
[facility] EMERGENCY DEPARTMENT Provider Note   CSN: [id number] Arrival date & time: 03/11/21  1136     History Chief Complaint  Patient presents with   Dental Pain    Annette [name] is a 30 y.o. female who presents with concern for left lower dental infection x1 month.  Patient states she fractured the tooth approximately a year ago and does not have a dentist to follow-up with.  She states that she has been taking large amounts of Tylenol, commendation of Tylenol PM and extra strength Tylenol since midnight.  She states between midnight and around noon today she took 20-30 Tylenol pills without relief of her pain.  She now is experiencing nausea, vomiting, epigastric and right upper quadrant pain and tenderness to palpation.  Patient was seen in urgent care earlier today with plan to discharge her with hydrocodone and Augmentin, however she is adamant that she needs to have her dental infection drained to give her relief.  She does not have any trismus, drooling, voice changes, swelling underneath the tongue underneath neck or neck stiffness.  She does endorse pain when she swallows.  I personally reviewed this patient's medical records.  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  HPI     Past Medical History:  Diagnosis Date   Ankle fracture    Diabetes mellitus without complication [facility])     Patient Active Problem List   Diagnosis Date Noted   Acetaminophen overdose, accidental or unintentional, initial encounter 03/11/2021   Acute deep vein thrombosis (DVT) of proximal vein of right lower extremity (HCC)    COVID-19    Pulmonary embolus (HCC) 09/16/2019    No past surgical history on file.   OB History   No obstetric history on file.     Family History  Problem Relation Age of Onset   Diabetes Mother    Diabetes Other     Social History    Tobacco Use   Smoking status: Never   Smokeless tobacco: Never    Home Medications Prior to Admission medications   Medication Sig Start Date End Date Taking? Authorizing Provider  chlorhexidine (PERIDEX) 0.12 % solution 15 mL swish and spit bid 01/17/21   Domenick Gong, MD  HYDROcodone-acetaminophen (NORCO/VICODIN) 5-325 MG tablet Take 1-2 tablets by mouth every 6 (six) hours as needed for moderate pain or severe pain. 01/17/21   Domenick Gong, MD  insulin glargine (LANTUS) 100 UNIT/ML injection Inject 0.2 mLs (20 Units total) into the skin daily. 12/23/20   Horton, Clabe Seal, DO  lidocaine (XYLOCAINE) 2 % solution Use as directed 10 mLs in the mouth or throat every 6 (six) hours as needed for mouth pain. Hold in mouth and spit. Do not swallow. 01/17/21   Domenick Gong, MD    Allergies    Patient has no known allergies.  Review of Systems   Review of Systems  Constitutional:  Negative for activity change, appetite change, chills, diaphoresis, fatigue and fever.  HENT:  Positive for dental problem and facial swelling. Negative for congestion, drooling, ear discharge, ear pain, sinus pressure, sneezing, sore throat, tinnitus, trouble swallowing and voice change.   Eyes: Negative.   Respiratory: Negative.    Cardiovascular: Negative.   Gastrointestinal:  Positive for abdominal pain, nausea and vomiting. Negative for constipation and diarrhea.  Genitourinary: Negative.   Musculoskeletal: Negative.   Neurological: Negative.    Physical Exam  Updated Vital Signs BP (!) 132/98   Pulse (!) 110   Temp 98.9 F (37.2 C)   Resp 17   Wt 95.3 kg   LMP 03/10/2021 (Exact Date)   SpO2 100%   BMI 32.89 kg/m   Physical Exam Vitals and nursing note reviewed.  Constitutional:      General: She is not in acute distress.    Appearance: She is obese. She is ill-appearing. She is not toxic-appearing.  HENT:     Head: Normocephalic and atraumatic.     Nose: Nose normal.      Mouth/Throat:     Mouth: Mucous membranes are moist.     Dentition: Abnormal dentition. Dental tenderness, gingival swelling and dental caries present. No dental abscesses.     Pharynx: Oropharynx is clear. Uvula midline. No oropharyngeal exudate, posterior oropharyngeal erythema or uvula swelling.     Tonsils: No tonsillar exudate.      Comments: No sublingual or submental tenderness to palpation, no anterior neck swelling or crepitus.  Patient is tolerating her own secretions well.  No sign of oropharyngeal abscess or uvular or tracheal deviation. Eyes:     General: Lids are normal. Vision grossly intact. No scleral icterus.       Right eye: No discharge.        Left eye: No discharge.     Conjunctiva/sclera: Conjunctivae normal.     Pupils: Pupils are equal, round, and reactive to light.  Neck:     Trachea: Trachea and phonation normal.  Cardiovascular:     Rate and Rhythm: Normal rate and regular rhythm.     Pulses: Normal pulses.     Heart sounds: Normal heart sounds. No murmur heard. Pulmonary:     Effort: Pulmonary effort is normal. No tachypnea, bradypnea, accessory muscle usage, prolonged expiration or respiratory distress.     Breath sounds: Normal breath sounds. No wheezing or rales.  Chest:     Chest wall: No mass, lacerations, deformity, swelling or tenderness.  Abdominal:     General: Bowel sounds are normal. There is no distension.     Tenderness: There is abdominal tenderness in the right upper quadrant and epigastric area. There is no right CVA tenderness, left CVA tenderness, guarding or rebound.  Musculoskeletal:        General: No deformity.     Cervical back: Normal range of motion and neck supple. No edema, rigidity or crepitus. No pain with movement, spinous process tenderness or muscular tenderness.     Right lower leg: No edema.     Left lower leg: No edema.  Lymphadenopathy:     Cervical: No cervical adenopathy.  Skin:    General: Skin is warm and dry.      Capillary Refill: Capillary refill takes less than 2 seconds.     Coloration: Skin is not jaundiced.  Neurological:     Mental Status: She is alert and oriented to person, place, and time. Mental status is at baseline.     Gait: Gait is intact.  Psychiatric:        Mood and Affect: Mood normal.    ED Results / Procedures / Treatments   Labs (all labs ordered are listed, but only abnormal results are displayed) Labs Reviewed  CBC WITH DIFFERENTIAL/PLATELET - Abnormal; Notable for the following components:      Result Value   RBC 5.35 (*)    Hemoglobin 15.4 (*)    HCT 46.7 (*)    All other components  within normal limits  COMPREHENSIVE METABOLIC PANEL - Abnormal; Notable for the following components:   CO2 20 (*)    Glucose, Bld 281 (*)    Calcium 8.6 (*)    Albumin 3.3 (*)    All other components within normal limits  ACETAMINOPHEN LEVEL - Abnormal; Notable for the following components:   Acetaminophen (Tylenol), Serum 45 (*)    All other components within normal limits  RESP PANEL BY RT-PCR (FLU A&B, COVID) ARPGX2  ETHANOL  SALICYLATE LEVEL  URINALYSIS, ROUTINE W REFLEX MICROSCOPIC  ACETAMINOPHEN LEVEL  BLOOD GAS, VENOUS  PROTIME-INR  I-STAT BETA HCG BLOOD, ED ([facility], [facility], [facility] ONLY)    EKG None  Radiology No results found.  Procedures .Critical Care  Date/Time: 03/11/2021 7:23 PM Performed by: Paris Lore, PA-C Authorized by: Paris Lore, PA-C   Critical care provider statement:    Critical care time (minutes):  45   Critical care was time spent personally by me on the following activities:  Discussions with consultants, evaluation of patient's response to treatment, examination of patient, ordering and performing treatments and interventions, ordering and review of laboratory studies, ordering and review of radiographic studies, pulse oximetry, re-evaluation of patient's condition, obtaining history from patient or surrogate and review of old charts    Medications Ordered in ED Medications  lactated ringers bolus 1,000 mL (1,000 mLs Intravenous Not Given 03/11/21 1656)  acetylcysteine (ACETADOTE) 40 mg/mL load via infusion 14,295 mg (has no administration in time range)    Followed by  acetylcysteine (ACETADOTE) 24,000 mg in dextrose 5 % 600 mL (40 mg/mL) infusion (has no administration in time range)  ondansetron (ZOFRAN) injection 4 mg (has no administration in time range)  fentaNYL (SUBLIMAZE) injection 50 mcg (has no administration in time range)  clindamycin (CLEOCIN) IVPB 600 mg (has no administration in time range)  ondansetron (ZOFRAN-ODT) disintegrating tablet 4 mg (4 mg Oral Given 03/11/21 1637)    ED Course  I have reviewed the triage vital signs and the nursing notes.  Pertinent labs & imaging results that were available during my care of the patient were reviewed by me and considered in my medical decision making (see chart for details).  Clinical Course as of 03/11/21 1924  Thu Mar 11, 2021  1625 Discussed patient's case with poison control, Verlon Au, who recommends obtaining acetaminophen level and metabolic panel.  If acetaminophen level returns detectable and LFTs are elevated, patient will likely require NAC therapy x24 hours.  Recommends call back to poison control once patient's initial labs are resulted. [RS]  1907 Consult to hospitalist, Dr. Allena Katz, is agreeable to seeing this patient admitting her to his service.  Appreciate his collaboration in the care of this patient. [RS]    Clinical Course User Index [RS] Justis Closser, Eugene Gavia, PA-C   MDM Rules/Calculators/A&P                         30 year old female presents with concern for left-sided mandibular dental pain x1 month.  Additionally taking large amounts of Tylenol to help with pain  Differential diagnose includes is limited to periapical infection, dental abscess, Ludwick's angina, oropharyngeal abscess, cellulitis.  Additionally in regards to nausea,  vomiting abdominal pain, concern for acetaminophen toxicity versus infectious symptoms  Tachycardic on intake, vital signs otherwise normal.  Cardiopulmonary exam focal abdominal exam significant for gastric and right upper quadrant tenderness to palpation.  EGD exam with dental decay and left mandibular gingival buccal mucosal  edema and tenderness to palpation that area of fluctuance amenable to drainage.  Poison control consulted as above, will proceed with basic laboratory studies.  Patient refused IV for IV fluids and IV antibiotics for her dental infection stating that "I just need the abscess removed from my mouth".  Attempted needle aspiration of the very tiny area of fluctuance in the mouth without drainage of any pus. CBC unremarkable, CMP with reassuring liver function test, no elevation in transaminases or bilirubin.  Unfortunately acetaminophen level is elevated to 45.  Repeat consult with poison control, who recommends NAC therapy according to their nomogram.  Extensive discussion with the patient regarding role of NAC therapy as well as dangers of acetaminophen toxicity.  She is amenable to being admitted to the hospital overnight as well as for IV therapy at this time.  We will proceed with NAC treatment, IV antibiotics for odontogenic infection, antiemetics, and pain medication IV.  No further work-up warranted in the ED as time.  Will obtain repeat acetaminophen level and metabolic panel tomorrow after completion of NAC therapy, and poison control remains available should inpatient management team require further instruction.  Solana voiced understanding for medical evaluation and treatment plan.  Each of her questions was answered to her expressed satisfaction.  She is amenable to plan for admission at this time.   This chart was dictated using voice recognition software, Dragon. Despite the best efforts of this provider to proofread and correct errors, errors may still occur which can  change documentation meaning.  Final Clinical Impression(s) / ED Diagnoses Final diagnoses:  None    Rx / DC Orders ED Discharge Orders     None        Sherrilee Gilles 03/11/21 1924    Linwood Dibbles, MD 03/11/21 2322

## 2021-03-11 NOTE — Discharge Instructions (Addendum)
-  For further evaluation and management of your dental infection, please head to ED.

## 2021-03-11 NOTE — ED Provider Notes (Signed)
[facility]    CSN: [id number] Arrival date & time: 03/11/21  1009      History   Chief Complaint Chief Complaint  Patient presents with   Abscess    Oral     HPI Verleen Stuckey is a 30 y.o. female presenting with dental abscess.  This is her third visit to [place] for dental infection in the last 2 months.  At last visit 01/17/21, she was prescribed Peridex, norco, viscous lidocaine, cetfin, flagyl.  States that her symptoms only somewhat improved after this.  She was advised to follow-up with a dentist, which she has not done.  She does not have a dentist appointment scheduled for the future.  Today again endorsing left upper and lower sided dental pain and pain radiating down the side of her left neck. Denies trismus, drooling, sore throat, voice changes, swelling underneath the tongue, swelling underneath the jaw, neck stiffness.  States that she has taken a significant amount of Tylenol PM with minimal improvement.   HPI  Past Medical History:  Diagnosis Date   Ankle fracture    Diabetes mellitus without complication [facility])     Patient Active Problem List   Diagnosis Date Noted   Acute deep vein thrombosis (DVT) of proximal vein of right lower extremity (HCC)    COVID-19    Pulmonary embolus (HCC) 09/16/2019    History reviewed. No pertinent surgical history.  OB History   No obstetric history on file.      Home Medications    Prior to Admission medications   Medication Sig Start Date End Date Taking? Authorizing Provider  chlorhexidine (PERIDEX) 0.12 % solution 15 mL swish and spit bid 01/17/21   Domenick Gong, MD  HYDROcodone-acetaminophen (NORCO/VICODIN) 5-325 MG tablet Take 1-2 tablets by mouth every 6 (six) hours as needed for moderate pain or severe pain. 01/17/21   Domenick Gong, MD  insulin glargine (LANTUS) 100 UNIT/ML injection Inject 0.2 mLs (20 Units total) into the skin daily. 12/23/20   Horton, Clabe Seal, DO  lidocaine (XYLOCAINE) 2 %  solution Use as directed 10 mLs in the mouth or throat every 6 (six) hours as needed for mouth pain. Hold in mouth and spit. Do not swallow. 01/17/21   Domenick Gong, MD    Family History Family History  Problem Relation Age of Onset   Diabetes Mother    Diabetes Other     Social History Social History   Tobacco Use   Smoking status: Never   Smokeless tobacco: Never     Allergies   Patient has no known allergies.   Review of Systems Review of Systems  HENT:  Positive for dental problem.   All other systems reviewed and are negative.   Physical Exam Triage Vital Signs ED Triage Vitals  Enc Vitals Group     BP 03/11/21 1052 121/86     Pulse Rate 03/11/21 1050 (!) 103     Resp 03/11/21 1050 19     Temp 03/11/21 1050 99.1 F (37.3 C)     Temp Source 03/11/21 1050 Oral     SpO2 03/11/21 1050 100 %     Weight --      Height --      Head Circumference --      Peak Flow --      Pain Score 03/11/21 1049 10     Pain Loc --      Pain Edu? --      Excl. in GC? --  No data found.  Updated Vital Signs BP 121/86   Pulse (!) 103   Temp 99.1 F (37.3 C) (Oral)   Resp 19   LMP 03/10/2021 (Exact Date)   SpO2 100%   Visual Acuity Right Eye Distance:   Left Eye Distance:   Bilateral Distance:    Right Eye Near:   Left Eye Near:    Bilateral Near:     Physical Exam Vitals reviewed.  Constitutional:      General: She is not in acute distress.    Appearance: Normal appearance. She is not ill-appearing or diaphoretic.  HENT:     Head: Normocephalic and atraumatic.     Comments: HENT: gingival tenderness and swelling surrounding L posterior lower and upper molars.  Poor dentition and multiple missing teeth and dental caries.  No expressible purulent drainage.  Positive tenderness along left lower face.  No appreciable facial swelling, no swelling or tenderness underneath the jaw or tongue.  No drooling, trismus, voice changes.   Cardiovascular:     Rate and  Rhythm: Normal rate and regular rhythm.     Heart sounds: Normal heart sounds.  Pulmonary:     Effort: Pulmonary effort is normal.     Breath sounds: Normal breath sounds.  Skin:    General: Skin is warm.  Neurological:     General: No focal deficit present.     Mental Status: She is alert and oriented to person, place, and time.  Psychiatric:        Mood and Affect: Mood normal.        Behavior: Behavior normal.        Thought Content: Thought content normal.        Judgment: Judgment normal.     UC Treatments / Results  Labs (all labs ordered are listed, but only abnormal results are displayed) Labs Reviewed - No data to display  EKG   Radiology No results found.  Procedures Procedures (including critical care time)  Medications Ordered in UC Medications - No data to display  Initial Impression / Assessment and Plan / UC Course  I have reviewed the triage vital signs and the nursing notes.  Pertinent labs & imaging results that were available during my care of the patient were reviewed by me and considered in my medical decision making (see chart for details).     This patient is a 30 year old female presenting with dental infection. Today she is afebrile, borderline tachycardic, oxygenating well on room air.  No swelling or tenderness of the floor or roof of mouth or chin, very low suspicion for ludwig's angina.   Planned to treat this patient with augmentin and hydrocodone for pain. However, patient states that she requires I&D and relief today. I do not believe this is medically necessary, which I discussed with this patient. She again verbalizes that she is headed to the emergency department.  I will defer management to emergency department provider.  This patient does take long-term anticoagulation given her history of PE and DVT.   Final Clinical Impressions(s) / UC Diagnoses   Final diagnoses:  Dental infection     Discharge Instructions      -For  further evaluation and management of your dental infection, please head to ED.       ED Prescriptions   None    I have reviewed the PDMP during this encounter.   Rhys Martini, PA-C 03/11/21 1131

## 2021-03-11 NOTE — ED Triage Notes (Signed)
Pt presents with an oral abscess x 1 month. She states it has gotten worse and took 20 pills in total of 3 different pain reducers. She states nothing gave her relief.

## 2021-03-11 NOTE — ED Triage Notes (Signed)
Patient here for dental abscess that appeared over one month ago. Patient alert, oriented, and in no apparent distress at this  time.

## 2021-03-11 NOTE — H&P (Signed)
History and Physical    Annette [name] FVC:[id number] DOB: 09/01/1991 DOA: 03/11/2021  PCP: Gifford Shave, MD  Patient coming from: Home  I have personally briefly reviewed patient's old medical records in [place]  Chief Complaint: Dental pain  HPI: Annette [name] is a 30 y.o. female with medical history significant for type 2 diabetes who presents to the ED for evaluation of dental pain.  Patient reports ongoing left posterior upper and lower molar dental pain that has been bothering her for over 1 month.  She has been seen in urgent care twice in April and was placed on separate oral antibiotic treatments initially with Augmentin then cefuroxime and Flagyl.  She says she took twenty 500 mg tablets of Tylenol last night due to uncontrolled pain.  She has been experiencing nausea and vomiting with mild generalized abdominal pain since then.  She came back to the ED for evaluation of her dental pain however she was noted to have an elevated acetaminophen level and recommendation was to admit for N-acetylcysteine treatment per poison control.  She says she is not taking any other medications.  She was previously on Lantus but has not been taking it for about 1 month.  ED Course:  Initial vitals show BP 132/90, pulse 110, RR 17, temp 98.9 F, SPO2 100% on room air.  Labs show WBC 5.7, hemoglobin 15.4, platelets 355,000, sodium 136, potassium 3.5, bicarb 20, BUN 7, creatinine 0.67, serum glucose 281, AST 17, ALT 16, alk phos 71, total bilirubin 0.4.  Acetaminophen level elevated at 45.  Serum ethanol less than 10, salicylate level 7.2.  SARS-CoV-2 PCR panel ordered and pending.  EDP discussed with poison control who recommended initiating N-acetylcysteine treatment and following acetaminophen level and liver enzymes.  Patient also given 1 L LR, IV clindamycin and IV fentanyl.  EDP attempted needle aspiration however no significant fluid collection present.  The hospitalist service  was consulted to admit for further evaluation and management.  Review of Systems: All systems reviewed and are negative except as documented in history of present illness above.   Past Medical History:  Diagnosis Date   Ankle fracture    Diabetes mellitus without complication ([place])     No past surgical history on file.  Social History:  reports that she has never smoked. She has never used smokeless tobacco. No history on file for alcohol use and drug use.  No Known Allergies  Family History  Problem Relation Age of Onset   Diabetes Mother    Diabetes Other      Prior to Admission medications   Medication Sig Start Date End Date Taking? Authorizing Provider  chlorhexidine (PERIDEX) 0.12 % solution 15 mL swish and spit bid 01/17/21   Melynda Ripple, MD  HYDROcodone-acetaminophen (NORCO/VICODIN) 5-325 MG tablet Take 1-2 tablets by mouth every 6 (six) hours as needed for moderate pain or severe pain. 01/17/21   Melynda Ripple, MD  insulin glargine (LANTUS) 100 UNIT/ML injection Inject 0.2 mLs (20 Units total) into the skin daily. 12/23/20   Horton, Alvin Critchley, DO  lidocaine (XYLOCAINE) 2 % solution Use as directed 10 mLs in the mouth or throat every 6 (six) hours as needed for mouth pain. Hold in mouth and spit. Do not swallow. 01/17/21   Melynda Ripple, MD    Physical Exam: Vitals:   03/11/21 1201 03/11/21 1619 03/11/21 1900  BP: 133/90 (!) 132/98   Pulse: (!) 102 (!) 110   Resp: 16 17   Temp: 98.9  F (37.2 C) 98.9 F (37.2 C)   TempSrc: Oral    SpO2: 100% 100%   Weight:   95.3 kg   Constitutional: Resting in bed, NAD, calm, comfortable Eyes: PERRL, lids and conjunctivae normal ENMT: Poor dentition with left posterior upper and lower molar caries with slight gingival swelling.  Oral airway is patent without evidence of trismus, no obvious abscess.  Mucous membranes are moist. Neck: normal, supple, no masses. Respiratory: clear to auscultation bilaterally, no  wheezing, no crackles. Normal respiratory effort. No accessory muscle use.  Cardiovascular: Regular rate and rhythm, no murmurs / rubs / gallops. No extremity edema. 2+ pedal pulses. Abdomen: Mild generalized tenderness, no masses palpated. No hepatosplenomegaly. Bowel sounds positive.  Musculoskeletal: no clubbing / cyanosis. No joint deformity upper and lower extremities. Good ROM, no contractures. Normal muscle tone.  Skin: no rashes, lesions, ulcers. No induration Neurologic: CN 2-12 grossly intact. Sensation intact. Strength 5/5 in all 4.  Psychiatric: Normal judgment and insight. Alert and oriented x 3. Normal mood.   Labs on Admission: I have personally reviewed following labs and imaging studies  CBC: Recent Labs  Lab 03/11/21 1635  WBC 5.7  NEUTROABS 2.8  HGB 15.4*  HCT 46.7*  MCV 87.3  PLT 741   Basic Metabolic Panel: Recent Labs  Lab 03/11/21 1635  NA 136  K 3.5  CL 106  CO2 20*  GLUCOSE 281*  BUN 7  CREATININE 0.67  CALCIUM 8.6*   GFR: CrCl cannot be calculated (Unknown ideal weight.). Liver Function Tests: Recent Labs  Lab 03/11/21 1635  AST 17  ALT 16  ALKPHOS 71  BILITOT 0.4  PROT 7.5  ALBUMIN 3.3*   No results for input(s): LIPASE, AMYLASE in the last 168 hours. No results for input(s): AMMONIA in the last 168 hours. Coagulation Profile: No results for input(s): INR, PROTIME in the last 168 hours. Cardiac Enzymes: No results for input(s): CKTOTAL, CKMB, CKMBINDEX, TROPONINI in the last 168 hours. BNP (last 3 results) No results for input(s): PROBNP in the last 8760 hours. HbA1C: No results for input(s): HGBA1C in the last 72 hours. CBG: No results for input(s): GLUCAP in the last 168 hours. Lipid Profile: No results for input(s): CHOL, HDL, LDLCALC, TRIG, CHOLHDL, LDLDIRECT in the last 72 hours. Thyroid Function Tests: No results for input(s): TSH, T4TOTAL, FREET4, T3FREE, THYROIDAB in the last 72 hours. Anemia Panel: No results for  input(s): VITAMINB12, FOLATE, FERRITIN, TIBC, IRON, RETICCTPCT in the last 72 hours. Urine analysis:    Component Value Date/Time   COLORURINE YELLOW 12/23/2020 1400   APPEARANCEUR CLEAR 12/23/2020 1400   LABSPEC 1.038 (H) 12/23/2020 1400   PHURINE 6.0 12/23/2020 1400   GLUCOSEU >=500 (A) 12/23/2020 1400   HGBUR NEGATIVE 12/23/2020 1400   BILIRUBINUR NEGATIVE 12/23/2020 1400   KETONESUR NEGATIVE 12/23/2020 1400   PROTEINUR NEGATIVE 12/23/2020 1400   NITRITE NEGATIVE 12/23/2020 1400   LEUKOCYTESUR SMALL (A) 12/23/2020 1400    Radiological Exams on Admission: No results found.  EKG: Ordered and pending.  Assessment/Plan Principal Problem:   Acetaminophen overdose, accidental or unintentional, initial encounter Active Problems:   Type 2 diabetes mellitus (HCC)   Pain, dental   Annette [name] is a 30 y.o. female with medical history significant for type 2 diabetes who is admitted after acetaminophen overdose.  Acetaminophen overdose: Reports taking twenty 500 mg tablets acetaminophen night prior to arrival and attempted pain control.  Denies any intent to harm self.  She has been having nausea and  vomiting.  Acetaminophen level elevated to 45.  LFTs within normal limits on admission. -Continue acetylcysteine -Repeat acetaminophen and LFT levels in a.m. -Avoid Tylenol  Poor dentition with dental pain/infection: No obvious dental or oropharyngeal abscess on exam.  Will place on IV clindamycin and check orthopantogram.  Will need dental follow-up.  Type 2 diabetes: Previously on Lantus, states she has not taken it in the last month.  Check A1c and place on sliding scale insulin with HS coverage.  DVT prophylaxis: Lovenox Code Status: Full code Family Communication: Discussed with patient, she has discussed with her significant other Disposition Plan: From home and likely discharge to home when cleared by poison control Consults called: Poison control Level of care:  Med-Surg Admission status:  Status is: Observation  The patient remains OBS appropriate and will d/c before 2 midnights.  Dispo: The patient is from: Home              Anticipated d/c is to: Home              Patient currently is not medically stable to d/c.   Difficult to place patient No  Zada Finders MD [facility]  If 7PM-7AM, please contact night-coverage [email or web address]  03/11/2021, 8:05 PM

## 2021-03-11 NOTE — ED Notes (Signed)
Attempted blood draw; unsuccessful. Will have NT attempt to draw labs.

## 2021-03-12 DIAGNOSIS — T391X1A Poisoning by 4-Aminophenol derivatives, accidental (unintentional), initial encounter: Secondary | ICD-10-CM

## 2021-03-12 LAB — CBC
HCT: 41.9 % (ref 36.0–46.0)
Hemoglobin: 14.1 g/dL (ref 12.0–15.0)
MCH: 28.9 pg (ref 26.0–34.0)
MCHC: 33.7 g/dL (ref 30.0–36.0)
MCV: 85.9 fL (ref 80.0–100.0)
Platelets: 350 10*3/uL (ref 150–400)
RBC: 4.88 MIL/uL (ref 3.87–5.11)
RDW: 12.3 % (ref 11.5–15.5)
WBC: 8.5 10*3/uL (ref 4.0–10.5)
nRBC: 0 % (ref 0.0–0.2)

## 2021-03-12 LAB — COMPREHENSIVE METABOLIC PANEL
ALT: 17 U/L (ref 0–44)
AST: 15 U/L (ref 15–41)
Albumin: 2.8 g/dL — ABNORMAL LOW (ref 3.5–5.0)
Alkaline Phosphatase: 57 U/L (ref 38–126)
Anion gap: 11 (ref 5–15)
BUN: 9 mg/dL (ref 6–20)
CO2: 21 mmol/L — ABNORMAL LOW (ref 22–32)
Calcium: 8.4 mg/dL — ABNORMAL LOW (ref 8.9–10.3)
Chloride: 103 mmol/L (ref 98–111)
Creatinine, Ser: 0.65 mg/dL (ref 0.44–1.00)
GFR, Estimated: >60 mL/min (ref >60)
Glucose, Bld: 361 mg/dL — ABNORMAL HIGH (ref 70–99)
Potassium: 3.9 mmol/L (ref 3.5–5.1)
Sodium: 135 mmol/L (ref 135–145)
Total Bilirubin: 0.5 mg/dL (ref 0.3–1.2)
Total Protein: 6.9 g/dL (ref 6.5–8.1)

## 2021-03-12 LAB — PROTIME-INR
INR: 1 (ref 0.8–1.2)
INR: 1.1 (ref 0.8–1.2)
Prothrombin Time: 13.3 seconds (ref 11.4–15.2)
Prothrombin Time: 13.8 seconds (ref 11.4–15.2)

## 2021-03-12 LAB — HEMOGLOBIN A1C
Hgb A1c MFr Bld: 14.3 % — ABNORMAL HIGH (ref 4.8–5.6)
Mean Plasma Glucose: 364 mg/dL

## 2021-03-12 LAB — HIV ANTIBODY (ROUTINE TESTING W REFLEX): HIV Screen 4th Generation wRfx: NONREACTIVE

## 2021-03-12 LAB — ACETAMINOPHEN LEVEL: Acetaminophen (Tylenol), Serum: <10 ug/mL — ABNORMAL LOW (ref 10–30)

## 2021-03-12 MED ORDER — MORPHINE SULFATE (PF) 2 MG/ML IV SOLN: 2.0000 mg | INTRAVENOUS | Status: DC | PRN

## 2021-03-12 MED ORDER — INSULIN GLARGINE 100 UNIT/ML ~~LOC~~ SOLN
10.0000 [IU] | Freq: Every day | SUBCUTANEOUS | Status: DC
  Administered 2021-03-12: 10 [IU] via SUBCUTANEOUS
  Filled 2021-03-12 (×2): qty 0.1

## 2021-03-12 MED ORDER — KETOROLAC TROMETHAMINE 30 MG/ML IJ SOLN
30.0000 mg | Freq: Once | INTRAMUSCULAR | Status: AC | PRN
  Administered 2021-03-12: 30 mg via INTRAVENOUS
  Filled 2021-03-12: qty 1

## 2021-03-12 MED ORDER — SODIUM CHLORIDE 0.9 % IV SOLN: INTRAVENOUS | Status: DC

## 2021-03-12 MED ORDER — INSULIN GLARGINE 100 UNIT/ML ~~LOC~~ SOLN
15.0000 [IU] | Freq: Every day | SUBCUTANEOUS | Status: DC
  Filled 2021-03-12: qty 0.15

## 2021-03-12 NOTE — Progress Notes (Signed)
I was notified by the nursing staff that patient left AGAINST MEDICAL ADVICE around 7 AM.  I did not get evaluate him.

## 2021-03-12 NOTE — Discharge Summary (Signed)
Physician Discharge Summary  Perris Tripathi ALP:[id number] DOB: [birth year] DOA: 03/11/2021  PCP: Derrel Nip, MD  Admit date: 03/11/2021 Discharge date: 03/12/2021  Admitted From: Home Disposition: AGAINST MEDICAL ADVICE  Recommendations for Outpatient Follow-up:  Left AGAINST MEDICAL ADVICE  Brief/Interim Summary: 30 year old female with history of diabetes mellitus type 2 admitted for dental pain and Tylenol overdose.  Patient was started on NAC protocol, pain medication and IV antibiotics.  Overnight patient received this but prior to my evaluation early in the morning around 7 AM patient ended up leaving AGAINST MEDICAL ADVICE.  Body mass index is 34.16 kg/m.  Pressure Injury 03/11/21 Foot Left;Right;Lateral Diabetic Ulcer (Active)  03/11/21 2311  Location: Foot  Location Orientation: Left;Right;Lateral  Staging:   Wound Description (Comments): Diabetic Ulcer  Present on Admission: Yes        Discharge Diagnoses:  Principal Problem:   Acetaminophen overdose, accidental or unintentional, initial encounter Active Problems:   Type 2 diabetes mellitus (HCC)   Pain, dental   Discharge Exam: Vitals:   03/11/21 2304 03/12/21 0500  BP: 129/89 110/82  Pulse: 100 100  Resp: 18 20  Temp: 97.6 F (36.4 C) 97.6 F (36.4 C)  SpO2: 100% 100%   Vitals:   03/11/21 2025 03/11/21 2304 03/11/21 2311 03/12/21 0500  BP: (!) 90/47 129/89  110/82  Pulse: (!) 109 100  100  Resp: 20 18  20   Temp: 97.7 F (36.5 C) 97.6 F (36.4 C)  97.6 F (36.4 C)  TempSrc:    Oral  SpO2: 100% 100%  100%  Weight: 95.3 kg  96 kg   Height: 5' (1.524 m)  5\' 6"  (1.676 m)      No Known Allergies  You were cared for by a hospitalist during your hospital stay. If you have any questions about your discharge medications or the care you received while you were in the hospital after you are discharged, you can call the unit and asked to speak with the hospitalist on call if the hospitalist that took  care of you is not available. Once you are discharged, your primary care physician will handle any further medical issues. Please note that no refills for any discharge medications will be authorized once you are discharged, as it is imperative that you return to your primary care physician (or establish a relationship with a primary care physician if you do not have one) for your aftercare needs so that they can reassess your need for medications and monitor your lab values.   Procedures/Studies: No results found.   The results of significant diagnostics from this hospitalization (including imaging, microbiology, ancillary and laboratory) are listed below for reference.     Microbiology: Recent Results (from the past 240 hour(s))  Resp Panel by RT-PCR (Flu A&B, Covid) Nasopharyngeal Swab     Status: None   Collection Time: 03/11/21  7:04 PM   Specimen: Nasopharyngeal Swab; Nasopharyngeal(NP) swabs in vial transport medium  Result Value Ref Range Status   SARS Coronavirus 2 by RT PCR NEGATIVE NEGATIVE Final    Comment: (NOTE) SARS-CoV-2 target nucleic acids are NOT DETECTED.  The SARS-CoV-2 RNA is generally detectable in upper respiratory specimens during the acute phase of infection. The lowest concentration of SARS-CoV-2 viral copies this assay can detect is 138 copies/mL. A negative result does not preclude SARS-Cov-2 infection and should not be used as the sole basis for treatment or other patient management decisions. A negative result may occur with  improper specimen collection/handling, submission of  specimen other than nasopharyngeal swab, presence of viral mutation(s) within the areas targeted by this assay, and inadequate number of viral copies(<138 copies/mL). A negative result must be combined with clinical observations, patient history, and epidemiological information. The expected result is Negative.  Fact Sheet for Patients:   BloggerCourse.com  Fact Sheet for Healthcare Providers:  SeriousBroker.it  This test is no t yet approved or cleared by the [place] FDA and  has been authorized for detection and/or diagnosis of SARS-CoV-2 by FDA under an Emergency Use Authorization (EUA). This EUA will remain  in effect (meaning this test can be used) for the duration of the COVID-19 declaration under Section 564(b)(1) of the Act, 21 U.S.C.section 360bbb-3(b)(1), unless the authorization is terminated  or revoked sooner.       Influenza A by PCR NEGATIVE NEGATIVE Final   Influenza B by PCR NEGATIVE NEGATIVE Final    Comment: (NOTE) The Xpert Xpress SARS-CoV-2/FLU/RSV plus assay is intended as an aid in the diagnosis of influenza from Nasopharyngeal swab specimens and should not be used as a sole basis for treatment. Nasal washings and aspirates are unacceptable for Xpert Xpress SARS-CoV-2/FLU/RSV testing.  Fact Sheet for Patients: BloggerCourse.com  Fact Sheet for Healthcare Providers: SeriousBroker.it  This test is not yet approved or cleared by the [place] FDA and has been authorized for detection and/or diagnosis of SARS-CoV-2 by FDA under an Emergency Use Authorization (EUA). This EUA will remain in effect (meaning this test can be used) for the duration of the COVID-19 declaration under Section 564(b)(1) of the Act, 21 U.S.C. section 360bbb-3(b)(1), unless the authorization is terminated or revoked.  Performed at [name], [address], [place], [place] [address]      Labs: BNP (last 3 results) No results for input(s): BNP in the last 8760 hours. Basic Metabolic Panel: Recent Labs  Lab 03/11/21 1635 03/12/21 0146  NA 136 135  K 3.5 3.9  CL 106 103  CO2 20* 21*  GLUCOSE 281* 361*  BUN 7 9  CREATININE 0.67 0.65  CALCIUM 8.6* 8.4*   Liver Function Tests: Recent  Labs  Lab 03/11/21 1635 03/12/21 0146  AST 17 15  ALT 16 17  ALKPHOS 71 57  BILITOT 0.4 0.5  PROT 7.5 6.9  ALBUMIN 3.3* 2.8*   No results for input(s): LIPASE, AMYLASE in the last 168 hours. No results for input(s): AMMONIA in the last 168 hours. CBC: Recent Labs  Lab 03/11/21 1635 03/12/21 0146  WBC 5.7 8.5  NEUTROABS 2.8  --   HGB 15.4* 14.1  HCT 46.7* 41.9  MCV 87.3 85.9  PLT 355 350   Cardiac Enzymes: No results for input(s): CKTOTAL, CKMB, CKMBINDEX, TROPONINI in the last 168 hours. BNP: Invalid input(s): POCBNP CBG: Recent Labs  Lab 03/11/21 2329  GLUCAP 328*   D-Dimer No results for input(s): DDIMER in the last 72 hours. Hgb A1c No results for input(s): HGBA1C in the last 72 hours. Lipid Profile No results for input(s): CHOL, HDL, LDLCALC, TRIG, CHOLHDL, LDLDIRECT in the last 72 hours. Thyroid function studies No results for input(s): TSH, T4TOTAL, T3FREE, THYROIDAB in the last 72 hours.  Invalid input(s): FREET3 Anemia work up No results for input(s): VITAMINB12, FOLATE, FERRITIN, TIBC, IRON, RETICCTPCT in the last 72 hours. Urinalysis    Component Value Date/Time   COLORURINE YELLOW 03/11/2021 2123   APPEARANCEUR CLEAR 03/11/2021 2123   LABSPEC 1.044 (H) 03/11/2021 2123   PHURINE 6.0 03/11/2021 2123   GLUCOSEU >=500 (  A) 03/11/2021 2123   HGBUR MODERATE (A) 03/11/2021 2123   BILIRUBINUR NEGATIVE 03/11/2021 2123   KETONESUR 80 (A) 03/11/2021 2123   PROTEINUR 30 (A) 03/11/2021 2123   NITRITE NEGATIVE 03/11/2021 2123   LEUKOCYTESUR TRACE (A) 03/11/2021 2123   Sepsis Labs Invalid input(s): PROCALCITONIN,  WBC,  LACTICIDVEN Microbiology Recent Results (from the past 240 hour(s))  Resp Panel by RT-PCR (Flu A&B, Covid) Nasopharyngeal Swab     Status: None   Collection Time: 03/11/21  7:04 PM   Specimen: Nasopharyngeal Swab; Nasopharyngeal(NP) swabs in vial transport medium  Result Value Ref Range Status   SARS Coronavirus 2 by RT PCR NEGATIVE  NEGATIVE Final    Comment: (NOTE) SARS-CoV-2 target nucleic acids are NOT DETECTED.  The SARS-CoV-2 RNA is generally detectable in upper respiratory specimens during the acute phase of infection. The lowest concentration of SARS-CoV-2 viral copies this assay can detect is 138 copies/mL. A negative result does not preclude SARS-Cov-2 infection and should not be used as the sole basis for treatment or other patient management decisions. A negative result may occur with  improper specimen collection/handling, submission of specimen other than nasopharyngeal swab, presence of viral mutation(s) within the areas targeted by this assay, and inadequate number of viral copies(<138 copies/mL). A negative result must be combined with clinical observations, patient history, and epidemiological information. The expected result is Negative.  Fact Sheet for Patients:  BloggerCourse.com  Fact Sheet for Healthcare Providers:  SeriousBroker.it  This test is no t yet approved or cleared by the [place] FDA and  has been authorized for detection and/or diagnosis of SARS-CoV-2 by FDA under an Emergency Use Authorization (EUA). This EUA will remain  in effect (meaning this test can be used) for the duration of the COVID-19 declaration under Section 564(b)(1) of the Act, 21 U.S.C.section 360bbb-3(b)(1), unless the authorization is terminated  or revoked sooner.       Influenza A by PCR NEGATIVE NEGATIVE Final   Influenza B by PCR NEGATIVE NEGATIVE Final    Comment: (NOTE) The Xpert Xpress SARS-CoV-2/FLU/RSV plus assay is intended as an aid in the diagnosis of influenza from Nasopharyngeal swab specimens and should not be used as a sole basis for treatment. Nasal washings and aspirates are unacceptable for Xpert Xpress SARS-CoV-2/FLU/RSV testing.  Fact Sheet for Patients: BloggerCourse.com  Fact Sheet for Healthcare  Providers: SeriousBroker.it  This test is not yet approved or cleared by the [place] FDA and has been authorized for detection and/or diagnosis of SARS-CoV-2 by FDA under an Emergency Use Authorization (EUA). This EUA will remain in effect (meaning this test can be used) for the duration of the COVID-19 declaration under Section 564(b)(1) of the Act, 21 U.S.C. section 360bbb-3(b)(1), unless the authorization is terminated or revoked.  Performed at [facility], [address], [place], [place] [address]      Time coordinating discharge:  I have spent 35 minutes face to face with the patient and on the ward discussing the patients care, assessment, plan and disposition with other care givers. >50% of the time was devoted counseling the patient about the risks and benefits of treatment/Discharge disposition and coordinating care.   SIGNED:   Dimple Nanas, MD  [facility] 03/12/2021, 11:43 AM   If 7PM-7AM, please contact night-coverage

## 2021-03-12 NOTE — Plan of Care (Signed)

## 2021-03-12 NOTE — Progress Notes (Signed)
Pt was witnessed by staff leaving in street clothes between 0715 and 0730.  Pt wasn't known to staff, and appeared to be a family member or visitor.

## 2021-05-19 ENCOUNTER — Emergency Department (HOSPITAL_BASED_OUTPATIENT_CLINIC_OR_DEPARTMENT_OTHER)
Admit: 2021-05-19 | Discharge: 2021-05-19 | Disposition: A | Discharge status: Home / Self Care | Payer: Self-pay | Attending: Emergency Medicine | Admitting: Emergency Medicine

## 2021-05-19 ENCOUNTER — Encounter (HOSPITAL_COMMUNITY): Payer: Self-pay

## 2021-05-19 ENCOUNTER — Ambulatory Visit (HOSPITAL_COMMUNITY)
Admission: EM | Admit: 2021-05-19 | Discharge: 2021-05-19 | Disposition: A | Discharge status: Home / Self Care | Payer: Self-pay | Attending: Student | Admitting: Student

## 2021-05-19 ENCOUNTER — Other Ambulatory Visit: Payer: Self-pay

## 2021-05-19 ENCOUNTER — Emergency Department (HOSPITAL_COMMUNITY)
Admission: EM | Admit: 2021-05-19 | Discharge: 2021-05-19 | Disposition: A | Discharge status: Home / Self Care | Payer: Medicaid Other | Attending: Emergency Medicine | Admitting: Emergency Medicine

## 2021-05-19 DIAGNOSIS — E1165 Type 2 diabetes mellitus with hyperglycemia: Secondary | ICD-10-CM | POA: Insufficient documentation

## 2021-05-19 DIAGNOSIS — E11621 Type 2 diabetes mellitus with foot ulcer: Secondary | ICD-10-CM | POA: Insufficient documentation

## 2021-05-19 DIAGNOSIS — Z8616 Personal history of COVID-19: Secondary | ICD-10-CM | POA: Insufficient documentation

## 2021-05-19 DIAGNOSIS — R609 Edema, unspecified: Secondary | ICD-10-CM

## 2021-05-19 DIAGNOSIS — L97528 Non-pressure chronic ulcer of other part of left foot with other specified severity: Secondary | ICD-10-CM | POA: Insufficient documentation

## 2021-05-19 DIAGNOSIS — I82462 Acute embolism and thrombosis of left calf muscular vein: Secondary | ICD-10-CM

## 2021-05-19 DIAGNOSIS — R002 Palpitations: Secondary | ICD-10-CM | POA: Insufficient documentation

## 2021-05-19 DIAGNOSIS — R739 Hyperglycemia, unspecified: Secondary | ICD-10-CM

## 2021-05-19 DIAGNOSIS — R079 Chest pain, unspecified: Secondary | ICD-10-CM | POA: Insufficient documentation

## 2021-05-19 DIAGNOSIS — L089 Local infection of the skin and subcutaneous tissue, unspecified: Secondary | ICD-10-CM | POA: Insufficient documentation

## 2021-05-19 LAB — CBC WITH DIFFERENTIAL/PLATELET
Abs Immature Granulocytes: 0.03 10*3/uL (ref 0.00–0.07)
Basophils Absolute: 0 10*3/uL (ref 0.0–0.1)
Basophils Relative: 0 %
Eosinophils Absolute: 0.1 10*3/uL (ref 0.0–0.5)
Eosinophils Relative: 1 %
HCT: 45.2 % (ref 36.0–46.0)
Hemoglobin: 14.9 g/dL (ref 12.0–15.0)
Immature Granulocytes: 0 %
Lymphocytes Relative: 22 %
Lymphs Abs: 2.1 10*3/uL (ref 0.7–4.0)
MCH: 29.3 pg (ref 26.0–34.0)
MCHC: 33 g/dL (ref 30.0–36.0)
MCV: 89 fL (ref 80.0–100.0)
Monocytes Absolute: 0.4 10*3/uL (ref 0.1–1.0)
Monocytes Relative: 4 %
Neutro Abs: 6.8 10*3/uL (ref 1.7–7.7)
Neutrophils Relative %: 73 %
Platelets: 311 10*3/uL (ref 150–400)
RBC: 5.08 MIL/uL (ref 3.87–5.11)
RDW: 11.8 % (ref 11.5–15.5)
WBC: 9.4 10*3/uL (ref 4.0–10.5)
nRBC: 0 % (ref 0.0–0.2)

## 2021-05-19 LAB — BASIC METABOLIC PANEL
Anion gap: 12 (ref 5–15)
BUN: 12 mg/dL (ref 6–20)
CO2: 20 mmol/L — ABNORMAL LOW (ref 22–32)
Calcium: 9.1 mg/dL (ref 8.9–10.3)
Chloride: 98 mmol/L (ref 98–111)
Creatinine, Ser: 0.71 mg/dL (ref 0.44–1.00)
GFR, Estimated: >60 mL/min (ref >60)
Glucose, Bld: 421 mg/dL — ABNORMAL HIGH (ref 70–99)
Potassium: 4.2 mmol/L (ref 3.5–5.1)
Sodium: 130 mmol/L — ABNORMAL LOW (ref 135–145)

## 2021-05-19 LAB — I-STAT BETA HCG BLOOD, ED (NOT ORDERABLE): I-stat hCG, quantitative: <5 m[IU]/mL (ref <5)

## 2021-05-19 MED ORDER — SODIUM CHLORIDE 0.9 % IV BOLUS: 1000.0000 mL | Freq: Once | INTRAVENOUS | Status: DC

## 2021-05-19 MED ORDER — ACETAMINOPHEN 500 MG PO TABS
1000.0000 mg | ORAL_TABLET | Freq: Once | ORAL | Status: AC
  Administered 2021-05-19: 1000 mg via ORAL
  Filled 2021-05-19: qty 2

## 2021-05-19 MED ORDER — LIDOCAINE HCL (PF) 1 % IJ SOLN
10.0000 mL | Freq: Once | INTRAMUSCULAR | Status: AC
  Administered 2021-05-19: 10 mL
  Filled 2021-05-19: qty 10

## 2021-05-19 MED ORDER — DOXYCYCLINE HYCLATE 100 MG PO CAPS: 100.0000 mg | ORAL_CAPSULE | Freq: Two times a day (BID) | ORAL | 0 refills | Status: DC

## 2021-05-19 MED ORDER — DOXYCYCLINE HYCLATE 100 MG PO TABS
100.0000 mg | ORAL_TABLET | Freq: Once | ORAL | Status: AC
  Administered 2021-05-19: 100 mg via ORAL
  Filled 2021-05-19: qty 1

## 2021-05-19 NOTE — Progress Notes (Signed)
Left lower extremity venous duplex has been completed. Preliminary results can be found in CV Proc through chart review.  Results were given to Dr. Anitra Lauth.  05/19/21 3:02 PM Olen Cordial RVT

## 2021-05-19 NOTE — ED Triage Notes (Signed)
Pt from [facility] with [facility] for further eval of left leg pain since yesterday, hx of blood clots. Pt also c.o chest pain. Resp e.u

## 2021-05-19 NOTE — Discharge Instructions (Signed)
ED via [facility]

## 2021-05-19 NOTE — Discharge Instructions (Signed)
Please read and follow all provided instructions.  Your diagnoses today include:  1. Infection of left foot   2. Hyperglycemia without ketosis     Tests performed today include: Vital signs. See below for your results today.   Medications prescribed:  Doxycycline - antibiotic  You have been prescribed an antibiotic medicine: take the entire course of medicine even if you are feeling better. Stopping early can cause the antibiotic not to work.  Take any prescribed medications only as directed.   Home care instructions:  Follow any educational materials contained in this packet. Keep affected area above the level of your heart when possible. Wash area gently twice a day with warm soapy water. Do not apply alcohol or hydrogen peroxide. Cover the area if it draining or weeping.   Follow-up instructions: Return to the Emergency Department in 48 hours for a recheck if your symptoms are not significantly improved.   Please follow-up with your primary care provider in the next 1 week for further evaluation of your symptoms.   Return instructions:  Return to the Emergency Department if you have: Fever Worsening symptoms Worsening pain Worsening swelling Redness of the skin that moves away from the affected area, especially if it streaks away from the affected area  Any other emergent concerns  Your vital signs today were: BP 112/61 (BP Location: Right Arm)   Pulse (!) 117   Temp 98.5 F (36.9 C) (Oral)   Resp 18   LMP 05/17/2021   SpO2 100%  If your blood pressure (BP) was elevated above 135/85 this visit, please have this repeated by your doctor within one month. --------------

## 2021-05-19 NOTE — ED Triage Notes (Signed)
Pt reports swelling and pain in the left leg and left foot since this morning. Sates she is not able to walk. Pt reports she had blood clots before and feels the same this time.

## 2021-05-19 NOTE — ED Notes (Signed)
Spoke to [facility]-report given

## 2021-05-19 NOTE — ED Provider Notes (Signed)
[facility]    CSN: [id number] Arrival date & time: 05/19/21  1200      History   Chief Complaint Chief Complaint  Patient presents with   Leg Problem    HPI Annette [name] is a 30 y.o. female presenting with suspected DVT/PE.  Reports about 1 day of swelling and pain to the left lower extremity, extending from the medial foot up to the calf and knee.  Patient initially states that she is unable to walk due to the pain, though she is ambulating without difficulty during the visit.  This patient also endorses palpitations, but denies shortness of breath, chest pain, dizziness.  Her last DVT was 2020 following COVID-19, she was on anticoagulation for 3 months but was taken off of this.  Denies current triggers including prolonged immobilization or surgery, recent travel, trauma.  She is not on OCP.  She is not a smoker.  She does have a family history of DVT in mother.  This patient is also a diabetic.  HPI  Past Medical History:  Diagnosis Date   Ankle fracture    Diabetes mellitus without complication [facility])     Patient Active Problem List   Diagnosis Date Noted   Acetaminophen overdose, accidental or unintentional, initial encounter 03/11/2021   Type 2 diabetes mellitus (HCC) 03/11/2021   Pain, dental 03/11/2021   Acute deep vein thrombosis (DVT) of proximal vein of right lower extremity (HCC)    COVID-19    Pulmonary embolus (HCC) 09/16/2019    History reviewed. No pertinent surgical history.  OB History   No obstetric history on file.      Home Medications    Prior to Admission medications   Not on File    Family History Family History  Problem Relation Age of Onset   Diabetes Mother    Diabetes Other     Social History Social History   Tobacco Use   Smoking status: Never   Smokeless tobacco: Never     Allergies   Patient has no known allergies.   Review of Systems Review of Systems  Musculoskeletal:        Left LE pain     Physical Exam Triage Vital Signs ED Triage Vitals  Enc Vitals Group     BP      Pulse      Resp      Temp      Temp src      SpO2      Weight      Height      Head Circumference      Peak Flow      Pain Score      Pain Loc      Pain Edu?      Excl. in GC?    No data found.  Updated Vital Signs BP 127/87 (BP Location: Right Arm)   Pulse (!) 119   Temp 98.4 F (36.9 C) (Oral)   Resp 18   LMP  (Within Days)   SpO2 99%   Visual Acuity Right Eye Distance:   Left Eye Distance:   Bilateral Distance:    Right Eye Near:   Left Eye Near:    Bilateral Near:     Physical Exam Vitals reviewed.  Constitutional:      Appearance: Normal appearance. She is not diaphoretic.  HENT:     Head: Normocephalic and atraumatic.     Mouth/Throat:     Mouth: Mucous membranes are moist.  Eyes:     Extraocular Movements: Extraocular movements intact.     Pupils: Pupils are equal, round, and reactive to light.  Cardiovascular:     Rate and Rhythm: Regular rhythm. Tachycardia present.     Pulses:          Radial pulses are 2+ on the right side and 2+ on the left side.     Heart sounds: Normal heart sounds.     Comments: L medial foot- effusion and tenderness. Calf tenderness. Positive homan sign. In wheelchair due to pain. Cap refill <2 seconds L foot. Pulmonary:     Effort: Pulmonary effort is normal.     Breath sounds: Normal breath sounds.  Abdominal:     Palpations: Abdomen is soft.     Tenderness: There is no abdominal tenderness. There is no guarding or rebound.  Musculoskeletal:     Right lower leg: No edema.     Left lower leg: No edema.  Skin:    General: Skin is warm.     Capillary Refill: Capillary refill takes less than 2 seconds.  Neurological:     General: No focal deficit present.     Mental Status: She is alert and oriented to person, place, and time.  Psychiatric:        Mood and Affect: Mood normal.        Behavior: Behavior normal.        Thought  Content: Thought content normal.        Judgment: Judgment normal.     UC Treatments / Results  Labs (all labs ordered are listed, but only abnormal results are displayed) Labs Reviewed - No data to display  EKG   Radiology No results found.  Procedures Procedures (including critical care time)  Medications Ordered in UC Medications - No data to display  Initial Impression / Assessment and Plan / UC Course  I have reviewed the triage vital signs and the nursing notes.  Pertinent labs & imaging results that were available during my care of the patient were reviewed by me and considered in my medical decision making (see chart for details).     This patient is a very pleasant 30 y.o. year old female presenting with suspected DVT/PE- left LE. Tachycardic ranging from 119 to 130, but oxygenating well on room air. Reports palpitations. EKG with sinus tachycardia compared with 03/2021 EKG. History DVT and PE in the past (R LE): last DVT 2020 following COVID-19. She does have a strong family history of clotting. She is not currently on a blood thinner. No risk factors identified today. Transported to ED via [facility].    Final Clinical Impressions(s) / UC Diagnoses   Final diagnoses:  Acute deep vein thrombosis (DVT) of calf muscle vein of left lower extremity [facility])     Discharge Instructions      ED via [facility]     ED Prescriptions   None    PDMP not reviewed this encounter.   Rhys Martini, PA-C 05/19/21 1309

## 2021-05-19 NOTE — ED Provider Notes (Signed)
[facility] EMERGENCY DEPARTMENT Provider Note   CSN: [id number] Arrival date & time: 05/19/21  1326     History Chief Complaint  Patient presents with   Leg Pain    Annette [name] is a 31 y.o. female.  Patient with history of diabetes, DVT/PE during COVID infection, not currently on anticoagulation --presents the emergency department for left foot pain.  She states that she has had blistering to the bottom of her left foot starting a couple days ago.  It is very painful to walk when pressure is put over the blistered area.  She presented to urgent care who was concerned about a blood clot in the leg.  She does have some soreness that extends up to the calf --but she is mainly complaining of pain in the foot.  She reported some nonspecific chest pain earlier, not currently present.  She has been tachycardic without fever.  Her oxygen saturations have look great.  She states that her symptoms do not feel similar to when she had a blood clot in her lungs.  DVT study ordered at triage was negative.  Patient had a fever yesterday of 100.9 F.      Past Medical History:  Diagnosis Date   Ankle fracture    Diabetes mellitus without complication [facility])     Patient Active Problem List   Diagnosis Date Noted   Acetaminophen overdose, accidental or unintentional, initial encounter 03/11/2021   Type 2 diabetes mellitus (HCC) 03/11/2021   Pain, dental 03/11/2021   Acute deep vein thrombosis (DVT) of proximal vein of right lower extremity (HCC)    COVID-19    Pulmonary embolus (HCC) 09/16/2019    No past surgical history on file.   OB History   No obstetric history on file.     Family History  Problem Relation Age of Onset   Diabetes Mother    Diabetes Other     Social History   Tobacco Use   Smoking status: Never   Smokeless tobacco: Never    Home Medications Prior to Admission medications   Not on File    Allergies    Patient has no known  allergies.  Review of Systems   Review of Systems  Constitutional:  Negative for activity change and fever.  HENT:  Negative for rhinorrhea and sore throat.   Eyes:  Negative for redness.  Respiratory:  Negative for cough and shortness of breath.   Cardiovascular:  Negative for chest pain and leg swelling.  Gastrointestinal:  Negative for abdominal pain, diarrhea, nausea and vomiting.  Genitourinary:  Negative for dysuria, frequency, hematuria and urgency.  Musculoskeletal:  Positive for arthralgias. Negative for back pain, joint swelling, myalgias and neck pain.  Skin:  Positive for color change. Negative for rash and wound.  Neurological:  Negative for weakness, numbness and headaches.   Physical Exam Updated Vital Signs BP 124/80   Pulse (!) 116   Temp 98.5 F (36.9 C) (Oral)   Resp 16   LMP 05/17/2021   SpO2 100%   Physical Exam Vitals and nursing note reviewed.  Constitutional:      General: She is not in acute distress.    Appearance: She is well-developed.  HENT:     Head: Normocephalic and atraumatic.     Right Ear: External ear normal.     Left Ear: External ear normal.     Nose: Nose normal.  Eyes:     Conjunctiva/sclera: Conjunctivae normal.  Cardiovascular:  Rate and Rhythm: Regular rhythm. Tachycardia present.     Heart sounds: No murmur heard. Pulmonary:     Effort: No respiratory distress.     Breath sounds: No wheezing, rhonchi or rales.  Abdominal:     Palpations: Abdomen is soft.     Tenderness: There is no abdominal tenderness. There is no guarding or rebound.  Musculoskeletal:     Cervical back: Normal range of motion and neck supple.     Right lower leg: No edema.     Left lower leg: No edema.     Comments: Patient with blistering noted to the base of the left great toe on the plantar aspect of the foot.  There is associated redness and swelling consistent with cellulitis.  No lymphangitis.  Skin:    General: Skin is warm and dry.      Findings: No rash.  Neurological:     General: No focal deficit present.     Mental Status: She is alert. Mental status is at baseline.     Motor: No weakness.  Psychiatric:        Mood and Affect: Mood normal.    ED Results / Procedures / Treatments   Labs (all labs ordered are listed, but only abnormal results are displayed) Labs Reviewed  BASIC METABOLIC PANEL - Abnormal; Notable for the following components:      Result Value   Sodium 130 (*)    CO2 20 (*)    Glucose, Bld 421 (*)    All other components within normal limits  CBC WITH DIFFERENTIAL/PLATELET  I-STAT BETA HCG BLOOD, ED ([facility], [facility], [facility] ONLY)    EKG None  Radiology VAS [place] LOWER EXTREMITY VENOUS (DVT) (ONLY [facility] & [facility] 7a-7p)  Result Date: 05/19/2021  Lower Venous DVT Study Patient Name:  Annette [name]  Date of Exam:   05/19/2021 Medical Rec #: [id number]       Accession #:    [id number] Date of Birth: 30-Sep-1991        Patient Gender: F Patient Age:   79 years Exam Location:  [facility] Procedure:      VAS [place] LOWER EXTREMITY VENOUS (DVT) Referring Phys: HINA KHATRI --------------------------------------------------------------------------------  Indications: Edema.  Risk Factors: None identified. Limitations: Poor ultrasound/tissue interface and patient pain tolerance. Comparison Study: No prior studies. Performing Technologist: Chanda Busing RVT  Examination Guidelines: A complete evaluation includes B-mode imaging, spectral Doppler, color Doppler, and power Doppler as needed of all accessible portions of each vessel. Bilateral testing is considered an integral part of a complete examination. Limited examinations for reoccurring indications may be performed as noted. The reflux portion of the exam is performed with the patient in reverse Trendelenburg.  +-----+---------------+---------+-----------+----------+--------------+ RIGHTCompressibilityPhasicitySpontaneityPropertiesThrombus Aging  +-----+---------------+---------+-----------+----------+--------------+ CFV  Full           Yes      Yes                                 +-----+---------------+---------+-----------+----------+--------------+   +---------+---------------+---------+-----------+----------+--------------+ LEFT     CompressibilityPhasicitySpontaneityPropertiesThrombus Aging +---------+---------------+---------+-----------+----------+--------------+ CFV      Full           Yes      Yes                                 +---------+---------------+---------+-----------+----------+--------------+ SFJ      Full                                                        +---------+---------------+---------+-----------+----------+--------------+  FV Prox  Full                                                        +---------+---------------+---------+-----------+----------+--------------+ FV Mid   Full                                                        +---------+---------------+---------+-----------+----------+--------------+ FV DistalFull           Yes      Yes                                 +---------+---------------+---------+-----------+----------+--------------+ PFV      Full                                                        +---------+---------------+---------+-----------+----------+--------------+ POP      Full           Yes      Yes                                 +---------+---------------+---------+-----------+----------+--------------+ PTV      Full                                                        +---------+---------------+---------+-----------+----------+--------------+ PERO     Full                                                        +---------+---------------+---------+-----------+----------+--------------+    Summary: RIGHT: - No evidence of common femoral vein obstruction.  LEFT: - There is no evidence of deep vein thrombosis in the lower  extremity. However, portions of this examination were limited- see technologist comments above.  - No cystic structure found in the popliteal fossa.  *See table(s) above for measurements and observations.    Preliminary     Procedures .Marland KitchenIncision and Drainage  Date/Time: 05/19/2021 11:23 PM Performed by: Renne Crigler, PA-C Authorized by: Renne Crigler, PA-C   Consent:    Consent obtained:  Verbal   Consent given by:  Patient   Risks discussed:  Bleeding, infection and pain Location:    Type:  Fluid collection   Size:  1.5cm   Location:  Lower extremity   Lower extremity location:  Foot Anesthesia:    Anesthesia method:  None (Pt declined anesthetic injection) Procedure type:    Complexity:  Simple Procedure details:    Incision types:  Stab incision   Drainage:  Serous   Drainage amount:  Scant   Wound treatment:  Wound left open   Packing materials:  None   Medications Ordered in ED Medications  lidocaine (PF) (XYLOCAINE) 1 % injection 10 mL (has no administration in time range)  doxycycline (VIBRA-TABS) tablet 100 mg (has no administration in time range)  sodium chloride 0.9 % bolus 1,000 mL (has no administration in time range)  sodium chloride 0.9 % bolus 1,000 mL (has no administration in time range)    ED Course  I have reviewed the triage vital signs and the nursing notes.  Pertinent labs & imaging results that were available during my care of the patient were reviewed by me and considered in my medical decision making (see chart for details).  Patient seen and examined.  Sent for concern of DVT, however DVT study negative.  Symptoms are really in the foot and patient has signs of an infection.  She will be started on doxycycline.  We will give 2 L IV normal saline for elevated blood sugar and tachycardia.  Will give Tylenol although she has not had any documented fevers today during urgent care and ED evaluation.  Patient is asking me to incise the area to drain out  fluid to help relieve pain and make it easier to walk.  We discussed risks and benefits including worsening infection and poorly healing wounds due to her diabetes.  She elects to proceed.  Vital signs reviewed and are as follows: BP 124/80   Pulse (!) 116   Temp 98.5 F (36.9 C) (Oral)   Resp 16   LMP 05/17/2021   SpO2 100%   11:22 PM when I returned to the room, patient states that she was very anxious about being in the emergency department and is ready to go.  I told her I could do the incision and drainage right now, and she agreed.  This was performed as above.  We had to wait IV team to establish IV access.  She refused IV fluids with the exception of a small amount given while I was doing incision and drainage.  She will continue to take her home Lantus for elevated blood sugar.  I strongly encouraged PCP follow-up in the next 48 hours for recheck of her wound.  She states that she goes to [employer] and wellness.  Pt urged to return with worsening pain, worsening swelling, expanding area of redness or streaking up extremity, fever, or any other concerns.  Urged to take complete course of antibiotics as prescribed.  Pt verbalizes understanding and agrees with plan.     MDM Rules/Calculators/A&P                           Patient presents with what appears to be an infected blister of the left foot.  It appears superficial.  There was concern over DVT on urgent care visit and ultrasound was negative.  Patient was tachycardic, probably for multiple reasons including anxiety and dehydration due to elevated blood sugar.  Not concern for DKA today.  She does not have chest pain or shortness of breath.  Her oxygen level is 100%.  I have very low concern for PE and do not feel that she requires CT imaging of the chest tonight.  I&D performed as above as this is her main complaint.  Very small incision made given her history of diabetes.  Fluid-filled blister drained without significant  purulence.  Patient started on doxycycline and is encouraged to follow-up with PCP for recheck  given high risk location of infection in a diabetic.    Final Clinical Impression(s) / ED Diagnoses Final diagnoses:  Infection of left foot  Hyperglycemia without ketosis    Rx / DC Orders ED Discharge Orders          Ordered    doxycycline (VIBRAMYCIN) 100 MG capsule  2 times daily        05/19/21 2320             Renne CriglerGeiple, Nimco Bivens, Cordelia Poche-C 05/19/21 2327    Lorre NickAllen, Anthony, MD 05/20/21 1346

## 2021-05-19 NOTE — ED Notes (Signed)
Pt wil go by [facility] as she states she do not know anybody at [facility] that can give her a ride.

## 2021-05-19 NOTE — ED Notes (Signed)
[facility] requested as pt do not have transportation.   ED charge nurse was called, and explained pt will go by [facility] as pt do no t have transportation.

## 2021-05-19 NOTE — ED Provider Notes (Signed)
Emergency Medicine Provider Triage Evaluation Note  Annette [name] , a 30 y.o. female  was evaluated in triage.  Pt complains of left leg pain since yesterday.  Reports swelling and pain with movement.  History of DVT and PE not currently on anticoagulation.  Reports chest pain but she is unsure if this is "because of my nerves."  No OCP use.  Review of Systems  Positive: Leg swelling, chest pain Negative: Shortness of breath  Physical Exam  BP 121/87 (BP Location: Right Arm)   Pulse (!) 118   Temp 98.5 F (36.9 C) (Oral)   Resp 18   LMP 05/17/2021   SpO2 100%  Gen:   Awake, no distress   Resp:  Normal effort  MSK:   Moves extremities without difficulty  Other:  Diffuse tenderness of the left lower extremity.  2+ DP pulse palpated  Medical Decision Making  Medically screening exam initiated at 1:33 PM.  Appropriate orders placed.  Atley Scarboro was informed that the remainder of the evaluation will be completed by another provider, this initial triage assessment does not replace that evaluation, and the importance of remaining in the ED until their evaluation is complete.  DVT study and PE study ordered as well as lab work   Dietrich Pates, PA-C 05/19/21 1334    Gwyneth Sprout, MD 05/19/21 1406

## 2021-07-23 ENCOUNTER — Encounter (HOSPITAL_COMMUNITY): Payer: Self-pay | Admitting: Emergency Medicine

## 2021-07-23 ENCOUNTER — Other Ambulatory Visit: Payer: Self-pay

## 2021-07-23 ENCOUNTER — Ambulatory Visit (HOSPITAL_COMMUNITY)
Admission: EM | Admit: 2021-07-23 | Discharge: 2021-07-23 | Disposition: A | Discharge status: Home / Self Care | Payer: Self-pay | Attending: Physician Assistant | Admitting: Physician Assistant

## 2021-07-23 ENCOUNTER — Ambulatory Visit (INDEPENDENT_AMBULATORY_CARE_PROVIDER_SITE_OTHER): Payer: Self-pay

## 2021-07-23 DIAGNOSIS — R0602 Shortness of breath: Secondary | ICD-10-CM

## 2021-07-23 DIAGNOSIS — R509 Fever, unspecified: Secondary | ICD-10-CM

## 2021-07-23 DIAGNOSIS — R059 Cough, unspecified: Secondary | ICD-10-CM

## 2021-07-23 DIAGNOSIS — R051 Acute cough: Secondary | ICD-10-CM

## 2021-07-23 DIAGNOSIS — R079 Chest pain, unspecified: Secondary | ICD-10-CM

## 2021-07-23 DIAGNOSIS — Z86711 Personal history of pulmonary embolism: Secondary | ICD-10-CM

## 2021-07-23 NOTE — ED Notes (Signed)
Patient is being discharged from the Urgent Care and sent to the Emergency Department via [facility] . Per Dorann Ou PA, patient is in need of higher level of care due to chest pain, SOB, tachycardia, HX pulmonary embolism. Patient is aware and verbalizes understanding of plan of care.  Vitals:   07/23/21 1038  BP: 102/81  Pulse: (!) 127  Resp: 19  Temp: 98.2 F (36.8 C)  SpO2: 96%

## 2021-07-23 NOTE — ED Provider Notes (Signed)
[facility]    CSN: [id number] Arrival date & time: 07/23/21  1015      History   Chief Complaint Chief Complaint  Patient presents with   Shortness of Breath   Cough   Fever    HPI Annette [name] is a 30 y.o. female.   Patient presents today with a 3 to 4-day history of cough, chest pain, fever, shortness of breath, nausea.  Denies any known sick contacts.  She does report that she was treated with antibiotics approximately 2 months ago but denies additional antibiotic use.  She has tried over-the-counter medications for symptom management.  She believes that she may have pneumonia given clinical presentation.  She does have a history of DVT and PE approximately 2 years ago provoked by COVID-19.  She is not on chronic anticoagulation given provoked DVT.  She does not take exogenous estrogen.  Denies any recent surgery, hospitalization, immobilization, recent travel.   Past Medical History:  Diagnosis Date   Ankle fracture    Diabetes mellitus without complication [facility])     Patient Active Problem List   Diagnosis Date Noted   Acetaminophen overdose, accidental or unintentional, initial encounter 03/11/2021   Type 2 diabetes mellitus (HCC) 03/11/2021   Pain, dental 03/11/2021   Acute deep vein thrombosis (DVT) of proximal vein of right lower extremity (HCC)    COVID-19    Pulmonary embolus (HCC) 09/16/2019    History reviewed. No pertinent surgical history.  OB History   No obstetric history on file.      Home Medications    Prior to Admission medications   Medication Sig Start Date End Date Taking? Authorizing Provider  doxycycline (VIBRAMYCIN) 100 MG capsule Take 1 capsule (100 mg total) by mouth 2 (two) times daily. 05/19/21   Renne Crigler, PA-C    Family History Family History  Problem Relation Age of Onset   Diabetes Mother    Diabetes Other     Social History Social History   Tobacco Use   Smoking status: Never   Smokeless tobacco:  Never     Allergies   Patient has no known allergies.   Review of Systems Review of Systems  Constitutional:  Positive for activity change and fever. Negative for appetite change and fatigue.  HENT:  Negative for congestion, sinus pressure, sneezing and sore throat.   Respiratory:  Positive for cough and shortness of breath.   Cardiovascular:  Positive for chest pain. Negative for palpitations and leg swelling.  Gastrointestinal:  Positive for nausea. Negative for abdominal pain, diarrhea and vomiting.  Neurological:  Negative for dizziness, light-headedness and headaches.    Physical Exam Triage Vital Signs ED Triage Vitals  Enc Vitals Group     BP 07/23/21 1038 102/81     Pulse Rate 07/23/21 1038 (!) 127     Resp 07/23/21 1038 19     Temp 07/23/21 1038 98.2 F (36.8 C)     Temp Source 07/23/21 1038 Oral     SpO2 07/23/21 1038 96 %     Weight --      Height --      Head Circumference --      Peak Flow --      Pain Score 07/23/21 1034 9     Pain Loc --      Pain Edu? --      Excl. in GC? --    No data found.  Updated Vital Signs BP 102/81 (BP Location: Left Arm)  Pulse (!) 127   Temp 98.2 F (36.8 C) (Oral)   Resp 19   LMP 06/03/2021   SpO2 96%   Visual Acuity Right Eye Distance:   Left Eye Distance:   Bilateral Distance:    Right Eye Near:   Left Eye Near:    Bilateral Near:     Physical Exam Vitals reviewed.  Constitutional:      General: She is awake. She is not in acute distress.    Appearance: Normal appearance. She is well-developed. She is not ill-appearing.     Comments: Very pleasant female appears stated age in no acute distress sitting comfortably in exam room  HENT:     Head: Normocephalic and atraumatic.     Right Ear: Tympanic membrane, ear canal and external ear normal.     Left Ear: Tympanic membrane, ear canal and external ear normal.     Nose: Nose normal.     Mouth/Throat:     Pharynx: Uvula midline. No oropharyngeal exudate or  posterior oropharyngeal erythema.  Cardiovascular:     Rate and Rhythm: Regular rhythm. Tachycardia present.     Heart sounds: Normal heart sounds, S1 normal and S2 normal. No murmur heard. Pulmonary:     Effort: Pulmonary effort is normal.     Breath sounds: Normal breath sounds. No wheezing, rhonchi or rales.     Comments: Clear to auscultation bilaterally Abdominal:     General: Bowel sounds are normal.     Palpations: Abdomen is soft.     Tenderness: There is no abdominal tenderness. There is no right CVA tenderness, left CVA tenderness, guarding or rebound.  Psychiatric:        Behavior: Behavior is cooperative.     UC Treatments / Results  Labs (all labs ordered are listed, but only abnormal results are displayed) Labs Reviewed - No data to display  EKG   Radiology DG Chest 2 View  Result Date: 07/23/2021 CLINICAL DATA:  Chest pain and short of breath.  Cough and fever EXAM: CHEST - 2 VIEW COMPARISON:  None. FINDINGS: The heart size and mediastinal contours are within normal limits. Both lungs are clear. The visualized skeletal structures are unremarkable. IMPRESSION: No active cardiopulmonary disease. Electronically Signed   By: Marlan Palau M.D.   On: 07/23/2021 11:27    Procedures Procedures (including critical care time)  Medications Ordered in UC Medications - No data to display  Initial Impression / Assessment and Plan / UC Course  I have reviewed the triage vital signs and the nursing notes.  Pertinent labs & imaging results that were available during my care of the patient were reviewed by me and considered in my medical decision making (see chart for details).     EKG obtained showed normal sinus rhythm with right axis deviation and ventricular rate of 119 bpm without ischemic changes with nonspecific ST changes in leads I, III, aVL compared to 05/19/2021 tracing.  Chest x-ray was obtained and showed no acute abnormality.  Given active chest pain with  shortness of breath and patient having a history of PE discussed that she would need to go to the emergency room for further evaluation and management since we do not have the ability to rule out PE in urgent care.  Patient is agreeable to this and will go to the emergency room immediately following clinic.  Her vital signs were stable at the time of discharge and she was safe for private transport by her significant other.  She will go to [facility] emergency room immediately following visit.  Final Clinical Impressions(s) / UC Diagnoses   Final diagnoses:  Chest pain, unspecified type  SOB (shortness of breath)  Acute cough  History of pulmonary embolus (PE)     Discharge Instructions      Please go to the emergency room for further evaluation and management.  We need to rule out a blood clot given your history and your symptoms today.     ED Prescriptions   None    PDMP not reviewed this encounter.   Jeani Hawking, PA-C 07/23/21 1139

## 2021-07-23 NOTE — ED Triage Notes (Signed)
Pt presents with cough, fever, and sob Xs 3-4 days.

## 2021-07-23 NOTE — Discharge Instructions (Signed)
Please go to the emergency room for further evaluation and management.  We need to rule out a blood clot given your history and your symptoms today.

## 2021-07-29 ENCOUNTER — Emergency Department (HOSPITAL_BASED_OUTPATIENT_CLINIC_OR_DEPARTMENT_OTHER): Payer: Self-pay

## 2021-07-29 ENCOUNTER — Emergency Department (HOSPITAL_COMMUNITY): Payer: Self-pay

## 2021-07-29 ENCOUNTER — Encounter (HOSPITAL_COMMUNITY): Payer: Self-pay | Admitting: Emergency Medicine

## 2021-07-29 ENCOUNTER — Inpatient Hospital Stay (HOSPITAL_COMMUNITY)
Admission: EM | Admit: 2021-07-29 | Discharge: 2021-08-01 | DRG: 299 | Discharge status: Against Medical Advice | Payer: Self-pay | Attending: Family Medicine | Admitting: Family Medicine

## 2021-07-29 DIAGNOSIS — Z5329 Procedure and treatment not carried out because of patient's decision for other reasons: Secondary | ICD-10-CM | POA: Diagnosis not present

## 2021-07-29 DIAGNOSIS — I2694 Multiple subsegmental pulmonary emboli without acute cor pulmonale: Secondary | ICD-10-CM | POA: Diagnosis present

## 2021-07-29 DIAGNOSIS — Z86711 Personal history of pulmonary embolism: Secondary | ICD-10-CM

## 2021-07-29 DIAGNOSIS — M7989 Other specified soft tissue disorders: Secondary | ICD-10-CM

## 2021-07-29 DIAGNOSIS — Z28310 Unvaccinated for covid-19: Secondary | ICD-10-CM

## 2021-07-29 DIAGNOSIS — I82401 Acute embolism and thrombosis of unspecified deep veins of right lower extremity: Secondary | ICD-10-CM | POA: Insufficient documentation

## 2021-07-29 DIAGNOSIS — D6869 Other thrombophilia: Secondary | ICD-10-CM | POA: Diagnosis present

## 2021-07-29 DIAGNOSIS — I82452 Acute embolism and thrombosis of left peroneal vein: Secondary | ICD-10-CM | POA: Diagnosis present

## 2021-07-29 DIAGNOSIS — Z7984 Long term (current) use of oral hypoglycemic drugs: Secondary | ICD-10-CM

## 2021-07-29 DIAGNOSIS — Z86718 Personal history of other venous thrombosis and embolism: Secondary | ICD-10-CM

## 2021-07-29 DIAGNOSIS — M79605 Pain in left leg: Secondary | ICD-10-CM

## 2021-07-29 DIAGNOSIS — I82442 Acute embolism and thrombosis of left tibial vein: Secondary | ICD-10-CM | POA: Diagnosis present

## 2021-07-29 DIAGNOSIS — I82432 Acute embolism and thrombosis of left popliteal vein: Principal | ICD-10-CM | POA: Diagnosis present

## 2021-07-29 DIAGNOSIS — E1165 Type 2 diabetes mellitus with hyperglycemia: Secondary | ICD-10-CM | POA: Diagnosis present

## 2021-07-29 DIAGNOSIS — I2699 Other pulmonary embolism without acute cor pulmonale: Secondary | ICD-10-CM | POA: Diagnosis present

## 2021-07-29 DIAGNOSIS — Z20822 Contact with and (suspected) exposure to covid-19: Secondary | ICD-10-CM | POA: Diagnosis present

## 2021-07-29 DIAGNOSIS — Z833 Family history of diabetes mellitus: Secondary | ICD-10-CM

## 2021-07-29 LAB — CBC
HCT: 47.3 % — ABNORMAL HIGH (ref 36.0–46.0)
Hemoglobin: 15.6 g/dL — ABNORMAL HIGH (ref 12.0–15.0)
MCH: 28.6 pg (ref 26.0–34.0)
MCHC: 33 g/dL (ref 30.0–36.0)
MCV: 86.6 fL (ref 80.0–100.0)
Platelets: 242 10*3/uL (ref 150–400)
RBC: 5.46 MIL/uL — ABNORMAL HIGH (ref 3.87–5.11)
RDW: 12.8 % (ref 11.5–15.5)
WBC: 5.9 10*3/uL (ref 4.0–10.5)
nRBC: 0 % (ref 0.0–0.2)

## 2021-07-29 LAB — CBG MONITORING, ED: Glucose-Capillary: 313 mg/dL — ABNORMAL HIGH (ref 70–99)

## 2021-07-29 LAB — BASIC METABOLIC PANEL
Anion gap: 13 (ref 5–15)
BUN: 9 mg/dL (ref 6–20)
CO2: 18 mmol/L — ABNORMAL LOW (ref 22–32)
Calcium: 9 mg/dL (ref 8.9–10.3)
Chloride: 102 mmol/L (ref 98–111)
Creatinine, Ser: 0.76 mg/dL (ref 0.44–1.00)
GFR, Estimated: >60 mL/min (ref >60)
Glucose, Bld: 421 mg/dL — ABNORMAL HIGH (ref 70–99)
Potassium: 3.9 mmol/L (ref 3.5–5.1)
Sodium: 133 mmol/L — ABNORMAL LOW (ref 135–145)

## 2021-07-29 LAB — HEMOGLOBIN A1C
Hgb A1c MFr Bld: 13.3 % — ABNORMAL HIGH (ref 4.8–5.6)
Mean Plasma Glucose: 335.01 mg/dL

## 2021-07-29 LAB — MRSA NEXT GEN BY PCR, NASAL: MRSA by PCR Next Gen: NOT DETECTED

## 2021-07-29 LAB — PROTIME-INR
INR: 1 (ref 0.8–1.2)
Prothrombin Time: 13.4 seconds (ref 11.4–15.2)

## 2021-07-29 LAB — RESP PANEL BY RT-PCR (FLU A&B, COVID) ARPGX2
Influenza A by PCR: NEGATIVE
Influenza B by PCR: NEGATIVE
SARS Coronavirus 2 by RT PCR: NEGATIVE

## 2021-07-29 LAB — GLUCOSE, CAPILLARY: Glucose-Capillary: 557 mg/dL (ref 70–99)

## 2021-07-29 LAB — I-STAT BETA HCG BLOOD, ED (MC, WL, AP ONLY): I-stat hCG, quantitative: <5 m[IU]/mL (ref <5)

## 2021-07-29 LAB — HEPARIN LEVEL (UNFRACTIONATED)
Heparin Unfractionated: 1 IU/mL — ABNORMAL HIGH (ref 0.30–0.70)
Heparin Unfractionated: <0.1 IU/mL — ABNORMAL LOW (ref 0.30–0.70)

## 2021-07-29 LAB — BRAIN NATRIURETIC PEPTIDE: B Natriuretic Peptide: 189.7 pg/mL — ABNORMAL HIGH (ref 0.0–100.0)

## 2021-07-29 LAB — TROPONIN I (HIGH SENSITIVITY)
Troponin I (High Sensitivity): 10 ng/L (ref <18)
Troponin I (High Sensitivity): 12 ng/L (ref <18)

## 2021-07-29 MED ORDER — INSULIN GLARGINE-YFGN 100 UNIT/ML ~~LOC~~ SOLN
5.0000 [IU] | Freq: Once | SUBCUTANEOUS | Status: AC
  Administered 2021-07-29: 5 [IU] via SUBCUTANEOUS
  Filled 2021-07-29 (×2): qty 0.05

## 2021-07-29 MED ORDER — HEPARIN BOLUS VIA INFUSION
2500.0000 [IU] | Freq: Once | INTRAVENOUS | Status: AC
  Administered 2021-07-29: 2500 [IU] via INTRAVENOUS
  Filled 2021-07-29: qty 2500

## 2021-07-29 MED ORDER — INSULIN ASPART 100 UNIT/ML IJ SOLN
10.0000 [IU] | Freq: Once | INTRAMUSCULAR | Status: AC
  Administered 2021-07-29: 10 [IU] via INTRAVENOUS

## 2021-07-29 MED ORDER — MELATONIN 5 MG PO TABS
5.0000 mg | ORAL_TABLET | Freq: Every day | ORAL | Status: DC
  Administered 2021-07-29 – 2021-07-31 (×3): 5 mg via ORAL
  Filled 2021-07-29 (×3): qty 1

## 2021-07-29 MED ORDER — LACTATED RINGERS IV BOLUS
1000.0000 mL | Freq: Once | INTRAVENOUS | Status: AC
  Administered 2021-07-29: 1000 mL via INTRAVENOUS

## 2021-07-29 MED ORDER — INSULIN ASPART 100 UNIT/ML IJ SOLN
0.0000 [IU] | Freq: Three times a day (TID) | INTRAMUSCULAR | Status: DC
  Administered 2021-07-30: 9 [IU] via SUBCUTANEOUS

## 2021-07-29 MED ORDER — HEPARIN BOLUS VIA INFUSION
6400.0000 [IU] | Freq: Once | INTRAVENOUS | Status: AC
  Administered 2021-07-29: 6400 [IU] via INTRAVENOUS
  Filled 2021-07-29: qty 6400

## 2021-07-29 MED ORDER — IOHEXOL 350 MG/ML SOLN
100.0000 mL | Freq: Once | INTRAVENOUS | Status: AC | PRN
  Administered 2021-07-29: 80 mL via INTRAVENOUS

## 2021-07-29 MED ORDER — INSULIN ASPART 100 UNIT/ML IJ SOLN
9.0000 [IU] | Freq: Once | INTRAMUSCULAR | Status: AC
  Administered 2021-07-29: 9 [IU] via SUBCUTANEOUS

## 2021-07-29 MED ORDER — HEPARIN (PORCINE) 25000 UT/250ML-% IV SOLN
1700.0000 [IU]/h | INTRAVENOUS | Status: DC
  Administered 2021-07-29: 1400 [IU]/h via INTRAVENOUS
  Administered 2021-07-30: 1700 [IU]/h via INTRAVENOUS
  Filled 2021-07-29 (×2): qty 250

## 2021-07-29 NOTE — Plan of Care (Signed)

## 2021-07-29 NOTE — ED Provider Notes (Signed)
I was asked by primary team to help get IV after failed attempts. 20 gauge placed in right [facility]  Angiocath insertion Performed by: Audree Camel  Consent: Verbal consent obtained. Risks and benefits: risks, benefits and alternatives were discussed Time out: Immediately prior to procedure a "time out" was called to verify the correct patient, procedure, equipment, support staff and site/side marked as required.  Preparation: Patient was prepped and draped in the usual sterile fashion.  Vein Location: right basilic  Ultrasound Guided  Gauge: 20  Normal blood return and flush without difficulty Patient tolerance: Patient tolerated the procedure well with no immediate complications.     Pricilla Loveless, MD 07/29/21 1356

## 2021-07-29 NOTE — ED Provider Notes (Signed)
[facility] EMERGENCY DEPARTMENT Provider Note   CSN: [id number] Arrival date & time: 07/29/21  1002     History Chief Complaint  Patient presents with   Chest Pain   Cough    Annette [name] is a 30 y.o. female.  HPI 30 year old female history of DVT with previous anticoagulation, not currently on anticoagulation, presents today with upper respiratory infection symptoms going on for greater than 1 week.  She describes nasal congestion, sore throat, and cough with some fever last week.  She reports that when she was seen in outlying urgent care and tested but was not tested for COVID.  She does voice concerns regarding COVID.  She has not had any COVID vaccines. Patient is having some substernal sharp chest pain worse with inspiration and sharp in nature.  She associates this with worsened dyspnea.  She feels that this is similar to PE in the past.  She is complaining of some left calf pain.  This has been present for several days.  Review of records reveal that when she had her DVT it was thought to be secondary to COVID infection.  She has not been on ongoing anticoagulation.  She denies any other risk factors such as immobilization, estrogen, or recent other infection. She states that her menstrual cycles are regular and she is currently menstruating.     Past Medical History:  Diagnosis Date   Ankle fracture    Diabetes mellitus without complication [facility])     Patient Active Problem List   Diagnosis Date Noted   Acetaminophen overdose, accidental or unintentional, initial encounter 03/11/2021   Type 2 diabetes mellitus (HCC) 03/11/2021   Pain, dental 03/11/2021   Acute deep vein thrombosis (DVT) of proximal vein of right lower extremity (HCC)    COVID-19    Pulmonary embolus (HCC) 09/16/2019    History reviewed. No pertinent surgical history.   OB History   No obstetric history on file.     Family History  Problem Relation Age of Onset   Diabetes  Mother    Diabetes Other     Social History   Tobacco Use   Smoking status: Never   Smokeless tobacco: Never    Home Medications Prior to Admission medications   Medication Sig Start Date End Date Taking? Authorizing Provider  doxycycline (VIBRAMYCIN) 100 MG capsule Take 1 capsule (100 mg total) by mouth 2 (two) times daily. 05/19/21   Renne Crigler, PA-C    Allergies    Patient has no known allergies.  Review of Systems   Review of Systems  All other systems reviewed and are negative.  Physical Exam Updated Vital Signs BP (!) 132/110   Pulse (!) 112   Temp 98.2 F (36.8 C) (Oral)   Resp (!) 24   Ht 1.676 m (5\' 6" )   Wt 96 kg   LMP 07/27/2021   SpO2 98%   BMI 34.16 kg/m   Physical Exam Vitals and nursing note reviewed.  Constitutional:      General: She is not in acute distress.    Appearance: She is well-developed. She is not ill-appearing.  HENT:     Head: Normocephalic and atraumatic.  Eyes:     Pupils: Pupils are equal, round, and reactive to light.  Cardiovascular:     Rate and Rhythm: Regular rhythm. Tachycardia present.     Heart sounds: Normal heart sounds.  Pulmonary:     Effort: Tachypnea present.     Breath sounds: Normal breath  sounds.  Chest:     Chest wall: No mass or deformity.  Abdominal:     General: Bowel sounds are normal.     Palpations: Abdomen is soft.  Musculoskeletal:        General: Normal range of motion.     Cervical back: Normal range of motion and neck supple.     Right lower leg: No tenderness. No edema.     Left lower leg: Tenderness present. No edema.  Skin:    General: Skin is warm and dry.     Capillary Refill: Capillary refill takes less than 2 seconds.  Neurological:     General: No focal deficit present.     Mental Status: She is alert.  Psychiatric:        Mood and Affect: Mood normal.    ED Results / Procedures / Treatments   Labs (all labs ordered are listed, but only abnormal results are  displayed) Labs Reviewed  BASIC METABOLIC PANEL - Abnormal; Notable for the following components:      Result Value   Sodium 133 (*)    CO2 18 (*)    Glucose, Bld 421 (*)    All other components within normal limits  CBC - Abnormal; Notable for the following components:   RBC 5.46 (*)    Hemoglobin 15.6 (*)    HCT 47.3 (*)    All other components within normal limits  CBG MONITORING, ED - Abnormal; Notable for the following components:   Glucose-Capillary 313 (*)    All other components within normal limits  RESP PANEL BY RT-PCR (FLU A&B, COVID) ARPGX2  PROTIME-INR  HEPARIN LEVEL (UNFRACTIONATED)  I-STAT BETA HCG BLOOD, ED ([facility], [facility], [facility] ONLY)  TROPONIN I (HIGH SENSITIVITY)    EKG EKG Interpretation  Date/Time:  Thursday July 29 2021 10:11:28 EDT Ventricular Rate:  124 PR Interval:  150 QRS Duration: 68 QT Interval:  316 QTC Calculation: 453 R Axis:   116 Text Interpretation: Sinus tachycardia Septal infarct , age undetermined Lateral infarct , age undetermined Abnormal ECG Confirmed by Margarita Grizzle [phone]) on 07/29/2021 10:36:58 AM  Radiology DG Chest 2 View  Result Date: 07/29/2021 CLINICAL DATA:  Chest pain. EXAM: CHEST - 2 VIEW COMPARISON:  July 23, 2021. FINDINGS: The heart size and mediastinal contours are within normal limits. Both lungs are clear. The visualized skeletal structures are unremarkable. IMPRESSION: No active cardiopulmonary disease. Electronically Signed   By: Lupita Raider M.D.   On: 07/29/2021 10:42   CT Angio Chest PE W and/or Wo Contrast  Result Date: 07/29/2021 CLINICAL DATA:  Chest pain and difficulty breathing EXAM: CT ANGIOGRAPHY CHEST WITH CONTRAST TECHNIQUE: Multidetector CT imaging of the chest was performed using the standard protocol during bolus administration of intravenous contrast. Multiplanar CT image reconstructions and MIPs were obtained to evaluate the vascular anatomy. CONTRAST:  80mL OMNIPAQUE IOHEXOL 350 MG/ML SOLN  COMPARISON:  CT chest 09/16/2019 FINDINGS: Cardiovascular: Bilateral pulmonary emboli identified including emboli in the right and left main pulmonary arteries common bifurcations and within the bilateral upper and lower lobar pulmonary arteries. No saddle embolism visualized. Main pulmonary artery is normal caliber. There is evidence of right heart strain with RV to LV ratio greater than 1. Note is made of previous pulmonary emboli in similar location, although more extensive on the current study. Heart size is normal. No pericardial effusion identified. Thoracic aorta is normal in course and caliber. Mediastinum/Nodes: No enlarged mediastinal, hilar, or axillary lymph nodes. Thyroid gland,  trachea, and esophagus demonstrate no significant findings. Lungs/Pleura: Patchy ground-glass airspace densities identified in the left lung, mostly upper lobe. No pleural effusion or pneumothorax. Upper Abdomen: No acute process identified in the visualized upper abdomen. Musculoskeletal: No suspicious bony lesions identified. Review of the MIP images confirms the above findings. IMPRESSION: 1. Positive for acute PE with CT evidence of right heart strain (RV/LV Ratio = 1.17) consistent with at least submassive (intermediate risk) PE. The presence of right heart strain has been associated with an increased risk of morbidity and mortality. Please refer to the "PE Focused" order set in EPIC. 2. Mild patchy ground-glass infiltrates in the left lung suggesting pneumonia. Discussed with Dr. Rosalia Hammers over the telephone at 2:35 p.m. on 07/29/2021 with read back. Electronically Signed   By: Jannifer Hick M.D.   On: 07/29/2021 14:39   VAS [place] LOWER EXTREMITY VENOUS (DVT) (7a-7p)  Result Date: 07/29/2021  Lower Venous DVT Study Patient Name:  DEJAH DROESSLER  Date of Exam:   07/29/2021 Medical Rec #: [id number]       Accession #:    [id number] Date of Birth: 02-19-1991        Patient Gender: F Patient Age:   47 years Exam Location:  [facility] Procedure:      VAS [place] LOWER EXTREMITY VENOUS (DVT) Referring Phys: Duwayne Heck Josha Weekley --------------------------------------------------------------------------------  Indications: Swelling, and Pain.  Comparison Study: 05/19/21 prior Performing Technologist: [place] Ponder RVS  Examination Guidelines: A complete evaluation includes B-mode imaging, spectral Doppler, color Doppler, and power Doppler as needed of all accessible portions of each vessel. Bilateral testing is considered an integral part of a complete examination. Limited examinations for reoccurring indications may be performed as noted. The reflux portion of the exam is performed with the patient in reverse Trendelenburg.  +-----+---------------+---------+-----------+----------+--------------+ RIGHTCompressibilityPhasicitySpontaneityPropertiesThrombus Aging +-----+---------------+---------+-----------+----------+--------------+ CFV  Full           Yes      Yes                                 +-----+---------------+---------+-----------+----------+--------------+   +---------+---------------+---------+-----------+----------+-----------------+ LEFT     CompressibilityPhasicitySpontaneityPropertiesThrombus Aging    +---------+---------------+---------+-----------+----------+-----------------+ CFV      Full           Yes      Yes                                    +---------+---------------+---------+-----------+----------+-----------------+ SFJ      Full                                                           +---------+---------------+---------+-----------+----------+-----------------+ FV Prox  Full                                                           +---------+---------------+---------+-----------+----------+-----------------+ FV Mid   Full                                                           +---------+---------------+---------+-----------+----------+-----------------+  FV  DistalFull                                                           +---------+---------------+---------+-----------+----------+-----------------+ PFV      Full                                                           +---------+---------------+---------+-----------+----------+-----------------+ POP      None           No       No                   Age Indeterminate +---------+---------------+---------+-----------+----------+-----------------+ PTV      None                                         Age Indeterminate +---------+---------------+---------+-----------+----------+-----------------+ PERO     None                                         Age Indeterminate +---------+---------------+---------+-----------+----------+-----------------+    Summary: RIGHT: - No evidence of common femoral vein obstruction.  LEFT: - Findings consistent with age indeterminate deep vein thrombosis involving the left popliteal vein, left posterior tibial veins, and left peroneal veins. - No cystic structure found in the popliteal fossa.  *See table(s) above for measurements and observations.    Preliminary     Procedures Procedures   Medications Ordered in ED Medications  heparin bolus via infusion 6,400 Units (has no administration in time range)  heparin ADULT infusion 100 units/mL (25000 units/286mL) (has no administration in time range)  insulin aspart (novoLOG) injection 10 Units (has no administration in time range)  lactated ringers bolus 1,000 mL (1,000 mLs Intravenous Bolus 07/29/21 1236)  lactated ringers bolus 1,000 mL (1,000 mLs Intravenous Bolus 07/29/21 1238)  iohexol (OMNIPAQUE) 350 MG/ML injection 100 mL (80 mLs Intravenous Contrast Given 07/29/21 1423)    ED Course  I have reviewed the triage vital signs and the nursing notes.  Pertinent labs & imaging results that were available during my care of the patient were reviewed by me and considered in my medical decision  making (see chart for details).  Clinical Course as of 07/29/21 1509  Thu Jul 29, 2021  1100 CBC reviewed and within normal limits Chest x-Annalynne Ibanez reviewed and interpretation reviewed and within normal limits Pregnancy test is negative EKG tachycardic personally reviewed and interpreted [DR]    Clinical Course User Index [DR] Margarita Grizzle, MD   MDM Rules/Calculators/A&P                           Fluid bolus ordered DVT study and cta pending Received call from radiologist and patient with Kniffen get PE burden and right heart strain. Heparin ordered per pharmacy consult Discussed with Janeann Forehand, on-call for critical care.  She will Discussed the attending and call back regarding disposition  to ICU or floor-call back and advised that family practice can admit and they will see in consult Dr. Elliot Gurney consulted and will see for admission 1- dyspnea- pe, heparin ordered. 2- diabetes with hyperglycemia- bs elevated at 421- doubt dka, patient given fluid and bs decreased to 313 Will give insulin  Final Clinical Impression(s) / ED Diagnoses Final diagnoses:  Multiple subsegmental pulmonary emboli without acute cor pulmonale (HCC)  Deep vein thrombosis (DVT) of right lower extremity, unspecified chronicity, unspecified vein (HCC)    Rx / DC Orders ED Discharge Orders     None        Margarita Grizzle, MD 07/29/21 [phone]

## 2021-07-29 NOTE — Progress Notes (Signed)
ANTICOAGULATION CONSULT NOTE  Pharmacy Consult for heparin Indication: pulmonary embolus  No Known Allergies  Patient Measurements: Height: 5\' 6"  (167.6 cm) Weight: 96 kg (211 lb 10.3 oz) IBW/kg (Calculated) : 59.3 Heparin Dosing Weight: 80.7 kg  Vital Signs: Temp: 99.3 F (37.4 C) (10/27 1929) Temp Source: Oral (10/27 1929) BP: 109/71 (10/27 1929) Pulse Rate: 120 (10/27 1733)  Labs: Recent Labs    07/29/21 1029 07/29/21 1037 07/29/21 1805 07/29/21 2120  HGB 15.6*  --   --   --   HCT 47.3*  --   --   --   PLT 242  --   --   --   LABPROT  --  13.4  --   --   INR  --  1.0  --   --   HEPARINUNFRC  --   --  1.00* <0.10*  CREATININE 0.76  --   --   --   TROPONINIHS 10  --  12  --      Estimated Creatinine Clearance: 122.3 mL/min (by C-G formula based on SCr of 0.76 mg/dL).   Medical History: Past Medical History:  Diagnosis Date   Ankle fracture    Diabetes mellitus without complication [facility])     Assessment: 30 yo F with URI and dyspnea, hx of DVT in past, not on any current AC. New found acute PE on CTA - with RHS, RV/LV ratio 1.17.   Initial heparin level undetectable (previous high heparin level was drawn incorrectly ~2.5 hrs after drip was started, not at steady state). CBC at baseline, wnl. No bleeding or issues with infusion per discussion with RN and drip has not been off at all.  Goal of Therapy:  Heparin level 0.3-0.7 units/ml Monitor platelets by anticoagulation protocol: Yes   Plan:  Give heparin 2500 unit bolus Increase heparin infusion to 1700 units/hr Check 6hr heparin level Monitor daily CBC, s/sx bleeding   26, PharmD, BCPS Please check AMION for all [facility] contact numbers Clinical Pharmacist 07/29/2021 10:31 PM

## 2021-07-29 NOTE — ED Notes (Signed)
Iv fluids stopped IV access lost while in CT

## 2021-07-29 NOTE — ED Notes (Signed)
Attempted UV x 2 right and left AC

## 2021-07-29 NOTE — ED Provider Notes (Signed)
Emergency Medicine Provider Triage Evaluation Note  Annette [name] , a 30 y.o. female  was evaluated in triage.  Pt complains of upper respiratory infection symptoms and dyspnea.  Patient has history of DVT in the past.  She is concerned about COVID.Marland Kitchen  She reports URI symptoms for the least the past week.  She reports that she was seen at an urgent care but states that she was not tested for COVID.  Review of Systems  Positive: Left calf pain, dyspnea, cough, nasal congestion Negative: Patient not currently on anticoagulants.  Physical Exam  BP (!) 132/98 (BP Location: Right Arm)   Pulse (!) 133   Temp 98.2 F (36.8 C) (Oral)   Resp 18   LMP 07/27/2021   SpO2 96%  Gen:   Awake, no distress Heart tachycardic with regular rhythm Resp:  Normal effort  MSK:   Moves extremities without difficulty some pain left calf no swelling noted   Medical Decision Making  Medically screening exam initiated at 10:33 AM.  Appropriate orders placed.  Annette [name] was informed that the remainder of the evaluation will be completed by another provider, this initial triage assessment does not replace that evaluation, and the importance of remaining in the ED until their evaluation is complete.  Plan COVID test, chest x-Treysen Sudbeck, labs, and CTA.   Margarita Grizzle, MD 07/29/21 [phone]

## 2021-07-29 NOTE — H&P (Addendum)
Family Medicine Teaching [facility] Admission History and Physical Service Pager: [phone]  Patient name: Annette [name] Medical record number: [id number] Date of birth: 22-Feb-1991 Age: 30 y.o. Gender: female  Primary Care Provider: Derrel Nip, MD Consultants: Pulmonology Code Status: Full Preferred Emergency Contact: Rozann Lesches [phone]  Chief Complaint: SOB, chest pain and left calf pain  Assessment and Plan: Annette [name] is a 30 y.o. female presenting with dyspnea, chest pain and left calf pain who was found to have acute DVT and PE. PMH is significant for prior DVT, PE, T2DM and unintentional acetaminophen overdose.  Acute Pulmonary Embolism and Left LE DVT Patient presented with worsening dyspnea, pressure-like chest pain and left calf pain for 2 weeks. Dyspnea and left calf pain are worse with ambulation. Patient reports her symptoms are similar to her previous PE presentation. On admission patient was found to be tachypneic up to 120s, but remainder of vitals wnl including SpO2 99% on room air. Initial labs showed corrected Na of 138, normal Troponin 10, INR 1.0 and PT 13.4. EKG showed sinus tachycardia.  CT angiogram showed bilateral pulmonary emboli with right heart strain. LE doppler was positive for left lower extremity DVT. On exam she is tachycardic, with  severe tenderness of the left calf, otherwise the rest of the exam was benign. Patients overall presentation has strong indications for PE which is confirmed by the CTA study. There is low suspicion for pneumonia or CHF.  PCCM was consulted who recommend continued systemic heparin and consideration for systemic tPA should patient decompensate. No obvious risk factors for PE- no birth control, recent travel, recent surgery, or immobilization. No family hx of blood clots. Her prior PE was provoked in the setting of COVID-19 and she self discontinued Eliquis several months ago. -Admit to FPTS, progressive, attending  Dr. Pollie Meyer -SPO2 saturation goals >92% -Continue Heparin drip  -Follow-up echo -Follow up repeat troponin -Follow-up BNP -Routine vitals -PT/OT Eval -Daily Lab: BMP, CBC, Heparin level -Cardiorespiratory Monitoring    Uncontrolled Type II Diabetes  On admission patient's glucose was 421. Her last A1c on 03/11/21 is 14.3.  She  is currently not on any home medications. Patient admits she is unsure if she was suppose to be taking medications for her diabetes. Her blood glucose readings at home are usually in the 300s. Received 10u Aspart in the ED. -Semglee 5u now -Sensitive SSI -Carb modified diet -Obtain Hgb A1c -CBG qAC and qHS -Diabetic educator consult    FEN/GI: Carb modified diet Prophylaxis: Heparin  Disposition: Progressive  History of Present Illness:  Annette [name] is a 30 y.o. female presenting with shortness of breath, chest pain, and left calf pain.  Patient reports that she has been having worsening shortness of breath, and leg pain for the past 2 weeks.  She endorses associated midsternal chest heaviness that started around the same time. Her symptoms are present at rest but worse with exertion.  However the last 3 days have been significantly unbearable.  In the last 3 days she is unable to ambulate beyond 10 feet without needing to take a break due to severe SOB with significant left calf pain which prompted here to come to the ED. Before coming to the ED patient said she went to the urgent care because she thought she might have URI. At that time she also had dry cough and some congestion. Pneumonia was ruled out and patient returned home. Patient endorses some swelling on her left ankle that's only noticeable after standing for  a while.   Of note patient report she was has PE and DVT in December 2020 after COVID infection. She endorses her current symptoms are very similar to her presentation in 2020. However didn't suspect PE or DVT because she doesn't have COVID  this time. She denies having any recent sick contact and no family history of blood clot.   Review Of Systems: Per HPI with the following additions:   Review of Systems  Constitutional:  Positive for appetite change (dimished). Negative for chills, diaphoresis and fever.  Respiratory:  Positive for cough, chest tightness and shortness of breath. Negative for apnea, choking and wheezing.   Cardiovascular:  Positive for chest pain, palpitations and leg swelling.  Gastrointestinal:  Negative for abdominal distention, abdominal pain, blood in stool, constipation and diarrhea.  Endocrine: Positive for polydipsia and polyuria.  Neurological:  Positive for light-headedness. Negative for dizziness, syncope, numbness and headaches.    Patient Active Problem List   Diagnosis Date Noted   Pulmonary embolism (HCC) 07/29/2021   Deep vein thrombosis (DVT) of right lower extremity (HCC)    Acetaminophen overdose, accidental or unintentional, initial encounter 03/11/2021   Type 2 diabetes mellitus (HCC) 03/11/2021   Pain, dental 03/11/2021   Acute deep vein thrombosis (DVT) of proximal vein of right lower extremity (HCC)    COVID-19    Pulmonary embolus (HCC) 09/16/2019    Past Medical History: Past Medical History:  Diagnosis Date   Ankle fracture    Diabetes mellitus without complication (HCC)     Past Surgical History: History reviewed. No pertinent surgical history.  Social History: Social History   Tobacco Use   Smoking status: Never   Smokeless tobacco: Never   Additional social history:   Please also refer to relevant sections of EMR.  Family History: Family History  Problem Relation Age of Onset   Diabetes Mother    Diabetes Other     Allergies and Medications: No Known Allergies No current facility-administered medications on file prior to encounter.   Current Outpatient Medications on File Prior to Encounter  Medication Sig Dispense Refill    diphenhydramine-acetaminophen (TYLENOL PM) 25-500 MG TABS tablet Take 2 tablets by mouth at bedtime as needed (headache/pain/sleep/fever).     guaiFENesin (ROBITUSSIN) 100 MG/5ML liquid Take 5 mLs by mouth every 4 (four) hours as needed for cough or to loosen phlegm.     doxycycline (VIBRAMYCIN) 100 MG capsule Take 1 capsule (100 mg total) by mouth 2 (two) times daily. (Patient not taking: Reported on 07/29/2021) 14 capsule 0    Objective: BP (!) 134/100   Pulse (!) 115   Temp 98.2 F (36.8 C) (Oral)   Resp 16   Ht 5\' 6"  (1.676 m)   Wt 96 kg   LMP 07/27/2021   SpO2 99%   BMI 34.16 kg/m  Exam: General: Awake,well appearing, NAD  HEENT:Atraumatic, no conjunctiva injection, Moist mucus membrane Cardiovascular: Tachycardic, no murmurs, Normal S1/S2 Respiratory: CTAB, no wheezing or crackles, Normal WOB on RA Gastrointestinal: Soft, no distension or tenderness MSK: severe tenderness on left calf, no BLE edema, +2 pedal and radial pulse. Derm: Dry, warm Neuro: Oriented x3, No focal Neuro deficit Psych: appropriate mood and affect  Labs and Imaging: CBC BMET  Recent Labs  Lab 07/29/21 1029  WBC 5.9  HGB 15.6*  HCT 47.3*  PLT 242   Recent Labs  Lab 07/29/21 1029  NA 133*  K 3.9  CL 102  CO2 18*  BUN 9  CREATININE  0.76  GLUCOSE 421*  CALCIUM 9.0     EKG: Regular rhythm with P waves before every QRS and rate of 124 indicative of Sinus tachycardia.   DG Chest 2 View  Result Date: 07/29/2021 CLINICAL DATA:  Chest pain. EXAM: CHEST - 2 VIEW COMPARISON:  July 23, 2021. FINDINGS: The heart size and mediastinal contours are within normal limits. Both lungs are clear. The visualized skeletal structures are unremarkable. IMPRESSION: No active cardiopulmonary disease. Electronically Signed   By: Lupita Raider M.D.   On: 07/29/2021 10:42   CT Angio Chest PE W and/or Wo Contrast  Result Date: 07/29/2021 CLINICAL DATA:  Chest pain and difficulty breathing EXAM: CT  ANGIOGRAPHY CHEST WITH CONTRAST TECHNIQUE: Multidetector CT imaging of the chest was performed using the standard protocol during bolus administration of intravenous contrast. Multiplanar CT image reconstructions and MIPs were obtained to evaluate the vascular anatomy. CONTRAST:  56mL OMNIPAQUE IOHEXOL 350 MG/ML SOLN COMPARISON:  CT chest 09/16/2019 FINDINGS: Cardiovascular: Bilateral pulmonary emboli identified including emboli in the right and left main pulmonary arteries common bifurcations and within the bilateral upper and lower lobar pulmonary arteries. No saddle embolism visualized. Main pulmonary artery is normal caliber. There is evidence of right heart strain with RV to LV ratio greater than 1. Note is made of previous pulmonary emboli in similar location, although more extensive on the current study. Heart size is normal. No pericardial effusion identified. Thoracic aorta is normal in course and caliber. Mediastinum/Nodes: No enlarged mediastinal, hilar, or axillary lymph nodes. Thyroid gland, trachea, and esophagus demonstrate no significant findings. Lungs/Pleura: Patchy ground-glass airspace densities identified in the left lung, mostly upper lobe. No pleural effusion or pneumothorax. Upper Abdomen: No acute process identified in the visualized upper abdomen. Musculoskeletal: No suspicious bony lesions identified. Review of the MIP images confirms the above findings. IMPRESSION: 1. Positive for acute PE with CT evidence of right heart strain (RV/LV Ratio = 1.17) consistent with at least submassive (intermediate risk) PE. The presence of right heart strain has been associated with an increased risk of morbidity and mortality. Please refer to the "PE Focused" order set in EPIC. 2. Mild patchy ground-glass infiltrates in the left lung suggesting pneumonia. Discussed with Dr. Rosalia Hammers over the telephone at 2:35 p.m. on 07/29/2021 with read back. Electronically Signed   By: Jannifer Hick M.D.   On:  07/29/2021 14:39   VAS [place] LOWER EXTREMITY VENOUS (DVT) (7a-7p)  Result Date: 07/29/2021  Lower Venous DVT Study Patient Name:  BRISIA SCHUERMANN  Date of Exam:   07/29/2021 Medical Rec #: [id number]       Accession #:    [id number] Date of Birth: Aug 09, 1991        Patient Gender: F Patient Age:   49 years Exam Location:  [facility] Procedure:      VAS [place] LOWER EXTREMITY VENOUS (DVT) Referring Phys: Duwayne Heck RAY --------------------------------------------------------------------------------  Indications: Swelling, and Pain.  Comparison Study: 05/19/21 prior Performing Technologist: [place] Ponder RVS  Examination Guidelines: A complete evaluation includes B-mode imaging, spectral Doppler, color Doppler, and power Doppler as needed of all accessible portions of each vessel. Bilateral testing is considered an integral part of a complete examination. Limited examinations for reoccurring indications may be performed as noted. The reflux portion of the exam is performed with the patient in reverse Trendelenburg.  +-----+---------------+---------+-----------+----------+--------------+ RIGHTCompressibilityPhasicitySpontaneityPropertiesThrombus Aging +-----+---------------+---------+-----------+----------+--------------+ CFV  Full           Yes      Yes                                 +-----+---------------+---------+-----------+----------+--------------+   +---------+---------------+---------+-----------+----------+-----------------+  LEFT     CompressibilityPhasicitySpontaneityPropertiesThrombus Aging    +---------+---------------+---------+-----------+----------+-----------------+ CFV      Full           Yes      Yes                                    +---------+---------------+---------+-----------+----------+-----------------+ SFJ      Full                                                           +---------+---------------+---------+-----------+----------+-----------------+  FV Prox  Full                                                           +---------+---------------+---------+-----------+----------+-----------------+ FV Mid   Full                                                           +---------+---------------+---------+-----------+----------+-----------------+ FV DistalFull                                                           +---------+---------------+---------+-----------+----------+-----------------+ PFV      Full                                                           +---------+---------------+---------+-----------+----------+-----------------+ POP      None           No       No                   Age Indeterminate +---------+---------------+---------+-----------+----------+-----------------+ PTV      None                                         Age Indeterminate +---------+---------------+---------+-----------+----------+-----------------+ PERO     None                                         Age Indeterminate +---------+---------------+---------+-----------+----------+-----------------+    Summary: RIGHT: - No evidence of common femoral vein obstruction.  LEFT: - Findings consistent with age indeterminate deep vein thrombosis involving the left popliteal vein, left posterior tibial veins, and left peroneal veins. - No cystic structure found in the popliteal fossa.  *See table(s) above for measurements and observations. Electronically signed by Gerarda Fraction on 07/29/2021 at 4:16:22  PM.    Final      Jerre Simon, MD 07/29/2021, 5:23 PM PGY-1, [facility] Family [facility] FPTS Intern pager: [phone], text pages welcome   FPTS Upper-Level Resident Addendum   I have independently interviewed and examined the patient. I have discussed the above with Dr. Elliot Gurney and agree with the documented plan. My edits for correction/addition/clarification are included above. Please see any attending notes.   Maury Dus, MD PGY-2, [facility] 07/29/2021 7:43 PM  FPTS Service pager: [phone] (text pages welcome through [employer])

## 2021-07-29 NOTE — Progress Notes (Signed)
FPTS Interim Progress Note  S: Patient's current symptoms include chest pain when coughing (none at rest) and dyspnea on exertion (again, none at rest). She requests melatonin for sleep which she takes at home.  O: BP 109/71 (BP Location: Left Arm)   Pulse (!) 120   Temp 99.3 F (37.4 C) (Oral)   Resp 20   Ht 5\' 6"  (1.676 m)   Wt 211 lb 10.3 oz (96 kg)   LMP 07/27/2021   SpO2 97%   BMI 34.16 kg/m   Physical exam General: young female lying in bed Pulm: unlabored respirations CV: tachycardia present, cold extremities  A/P: 30 year old female with PMHx of PE during Covid -19 infection, self-d/c'd Eliquis, found to have submassive PE with right heart strain and positive LLE DVT. Watching BP closely, SBP from 106-141. Persistent tachycardia around 120 BPM. Patient has IV x2 in place (unfortunately only able to get 22G), on heparin drip. Recent trop of 12, BNP of 190. CBG of 557, given an additional 9U of novolog. Patient reports diarrhea x2 recently.  -Melatonin for sleep -Likely transition to DOAC tomorrow, will need life-long AC per pulm -Remainder per day team   26, MD PGY-1 FPTS Intern pager: [phone], text pages welcome

## 2021-07-29 NOTE — ED Notes (Signed)
MD at bedside. 

## 2021-07-29 NOTE — Hospital Course (Addendum)
Annette [name] is a 30 year old female who presented to the ED on 10/28 with dyspnea, chest pain and left calf pain and was admitted for an acute DVT and PE. PMH is signficant for prior DVT, PE, T2DM, and unintentional acetaminophen overdose. Brief Hospital course as outlined below.   LEFT HOSPITAL AMA  Acute Pulmonary Embolism and Left LE DVT Annette [name] presented to the ED with worsening dyspnea, pressure-like chest pain and left calf pain for 2 weeks. Pt was tachycardic up to 120s with remainder of vitals WNL including SpO2 99% on RA. Initial labs of normal troponin, INR 1.0 and PT 13.4. EKG showed sinus tachycardia. CTA revealed bilateral pulmonary emboli with rt heart strain. LE doppler positive for left LE DVT of popliteal vein, posterior tibial veins and peroneal veins. PCCM was consulted and recommended continued systemic heparin. Echo was positive for McConnell's sign. Normal LVEF with reduced RV pressure at 11.1 mmHg. Annette [name] symptoms were improving while inpatient and heparin was discontinued on 10/28 and she was started on oral DOAC (Xarelto 15 mg BID for 41 doses total). Early morning at 01:01 on 10/29 she desaturated on room air to 88% while asleep, this resolved with 2L [place]. During ambulatory test, her oxygen saturation was 87% on RA, tachycardic in 130s. On 2L [place] O2 sats increased to 92%.  No obvious risk factors for PE - no birth control, recent travel, recent surgery, or immobilization. No family hx of blood clots. Her previous PE was provoked in the setting of Covid-19 and she self discontinued Eliquis several months ago.  Amb pulse ox on the day of discharge 87% with HR 128.     Uncontrolled type 2 diabetes On admission, Annette [name] glucose was 421, and last A1c on 03/11/21 was 14.3. A1c performed 10/27 was 13.3. Currently not on any home medications, home glucose readings typically in 300s. Pt has received semglee while inpatient as well as aspart for the sensitive sliding scale. On  a carb modified diet while in the hospital. She met with diabetes educator on 10/28. Discharged meds Glargine 30 units daily.  Metformin XR 500 mg daily  Follow up: Family medicine: further diabetes education and monitoring. May need to increase Semglee dosage depending on glucose readings at home.  Start Xarelto 20 mg daily on 08/20/21 Hematology for further workup Please schedule appointment with Annette [name] at [facility] for financial assistance with Xarelto and Glargine

## 2021-07-29 NOTE — Progress Notes (Signed)
Lower extremity venous has been completed.   Preliminary results in CV Proc.   Annette [name] Annette [name] 07/29/2021 2:49 PM

## 2021-07-29 NOTE — Progress Notes (Signed)
ANTICOAGULATION CONSULT NOTE - Initial Consult  Pharmacy Consult for heparin Indication: pulmonary embolus  No Known Allergies  Patient Measurements: Height: 5\' 6"  (167.6 cm) Weight: 96 kg (211 lb 10.3 oz) IBW/kg (Calculated) : 59.3 Heparin Dosing Weight: 80.7 kg  Vital Signs: Temp: 98.2 F (36.8 C) (10/27 1007) Temp Source: Oral (10/27 1007) BP: 154/111 (10/27 1345) Pulse Rate: 108 (10/27 1345)  Labs: Recent Labs    07/29/21 1029 07/29/21 1037  HGB 15.6*  --   HCT 47.3*  --   PLT 242  --   LABPROT  --  13.4  INR  --  1.0  CREATININE 0.76  --   TROPONINIHS 10  --     Estimated Creatinine Clearance: 122.3 mL/min (by C-G formula based on SCr of 0.76 mg/dL).   Medical History: Past Medical History:  Diagnosis Date   Ankle fracture    Diabetes mellitus without complication (HCC)     Medications: see MAR  Assessment: 30 yo F with URI and dyspnea, hx of DVT in past, not on any current AC. New found acute PE on CTA - with RHS, RV/LV ratio 1.17.   CBC at baseline, wnl. No s/sx of bleeding noted.   Goal of Therapy:  Heparin level 0.3-0.7 units/ml Monitor platelets by anticoagulation protocol: Yes   Plan:  Give 6400 units bolus x 1 Start heparin infusion at 1400 units/hr Check anti-Xa level in 6 hours and daily while on heparin Continue to monitor H&H and platelets  26, PharmD, [facility] Emergency Medicine Clinical Pharmacist ED RPh Phone: [phone] Main RX: [phone]

## 2021-07-29 NOTE — Consult Note (Signed)
NAME:  Annette [name], MRN:  [id number], DOB:  Feb 14, 1991, LOS: 0 ADMISSION DATE:  07/29/2021, CONSULTATION DATE:  07/29/2021 REFERRING MD:  Dr. Rosalia Hammers, CHIEF COMPLAINT:  Pulmonary embolism    History of Present Illness:  Annette [name] is a 30 y.o. female with a PMH significant for prior PE and DVT felt secondary to hypercoagulability with COVID and diabetes who presented to the ED for complaints of SOB, chest pain, and calf cramping. She stated symptoms felt similar to prior PE/DVT admission. She denies any recent travel or prolonged immobility.   On admission patient was seen mildly tachycardic and hypertensive. Blood work significant for Na 133, glucose 421, and hgb 15.6. CTA chest was obtained and revealed acute PE with CT evidence of right heart strain (RV/LV ratio 1.17) and mild GGO concerned for possible pneumonia. PCCM was consulted given concern for need of PE intervention.  Pertinent  Medical History  PE and DVT 09/2019 Diabetes   Significant Hospital Events:  10/27 admitted with submassive PE with CT evidence of right heart strain   Interim History / Subjective:  As above   Objective   Blood pressure (!) 154/111, pulse (!) 108, temperature 98.2 F (36.8 C), temperature source Oral, resp. rate 19, height 5\' 6"  (1.676 m), weight 96 kg, last menstrual period 07/27/2021, SpO2 97 %.       No intake or output data in the 24 hours ending 07/29/21 1453 Filed Weights   07/29/21 1125  Weight: 96 kg    Examination: General: Pleasant adult female lying in bed in NAD HEENT: [place]/AT, MM pink/moist, PERRL,  Neuro: Alert and oriented x3, non-focal  CV: s1s2 regular rate and rhythm, no murmur, rubs, or gallops,  PULM:  Clear to ascultation bilaterally, no increased work of breathing  GI: soft, bowel sounds active in all 4 quadrants, non-tender, non-distended, tolerating oral diet  Extremities: warm/dry, no edema  Skin: no rashes or lesions  Resolved Hospital Problem list      Assessment & Plan:  Submassive PE with CT evidence of right heart strain -CTA chest was obtained and revealed acute PE with CT evidence of right heart strain (RV/LV ratio 1.17) and mild GGO concerned for possible pneumonia.  -PESI 48 P: Continue systemic heparin  Obtain ECHO and LE doppler  Given second reoccurrence without evident cause need to consider anticoagulation workup in the outpatient If patient were to decompensate strong consideration should be given to systemic tPA   PCCM will sign off. Thank you for the opportunity to participate in this patient's care. Please contact if we can be of further assistance.  Best Practice   Per primary   Labs   CBC: Recent Labs  Lab 07/29/21 1029  WBC 5.9  HGB 15.6*  HCT 47.3*  MCV 86.6  PLT 242    Basic Metabolic Panel: Recent Labs  Lab 07/29/21 1029  NA 133*  K 3.9  CL 102  CO2 18*  GLUCOSE 421*  BUN 9  CREATININE 0.76  CALCIUM 9.0   GFR: Estimated Creatinine Clearance: 122.3 mL/min (by C-G formula based on SCr of 0.76 mg/dL). Recent Labs  Lab 07/29/21 1029  WBC 5.9    Liver Function Tests: No results for input(s): AST, ALT, ALKPHOS, BILITOT, PROT, ALBUMIN in the last 168 hours. No results for input(s): LIPASE, AMYLASE in the last 168 hours. No results for input(s): AMMONIA in the last 168 hours.  ABG    Component Value Date/Time   HCO3 21.2 03/11/2021 2320   ACIDBASEDEF  3.5 (H) 03/11/2021 2320   O2SAT 67.2 03/11/2021 2320     Coagulation Profile: Recent Labs  Lab 07/29/21 1037  INR 1.0    Cardiac Enzymes: No results for input(s): CKTOTAL, CKMB, CKMBINDEX, TROPONINI in the last 168 hours.  HbA1C: Hgb A1c MFr Bld  Date/Time Value Ref Range Status  03/11/2021 11:34 PM 14.3 (H) 4.8 - 5.6 % Final    Comment:    (NOTE)         Prediabetes: 5.7 - 6.4         Diabetes: >6.4         Glycemic control for adults with diabetes: <7.0   09/17/2019 12:23 AM 12.9 (H) 4.8 - 5.6 % Final    Comment:     (NOTE) Pre diabetes:          5.7%-6.4% Diabetes:              >6.4% Glycemic control for   <7.0% adults with diabetes     CBG: No results for input(s): GLUCAP in the last 168 hours.  Review of Systems:   Please see the history of present illness. All other systems reviewed and are negative   Past Medical History:  She,  has a past medical history of Ankle fracture and Diabetes mellitus without complication (HCC).   Surgical History:  History reviewed. No pertinent surgical history.   Social History:   reports that she has never smoked. She has never used smokeless tobacco.   Family History:  Her family history includes Diabetes in her mother and another family member.   Allergies No Known Allergies   Home Medications  Prior to Admission medications   Medication Sig Start Date End Date Taking? Authorizing Provider  doxycycline (VIBRAMYCIN) 100 MG capsule Take 1 capsule (100 mg total) by mouth 2 (two) times daily. 05/19/21   Renne Crigler, PA-C     Critical care time: NA  Harlie Buening D. Tiburcio Pea, NP-C [facility] Personal contact information can be found on Amion  07/29/2021, 3:49 PM

## 2021-07-29 NOTE — ED Triage Notes (Signed)
Pt. Stated, I need to be checked for a blood clot , Ive had chest pain with a cough and hard to walk since last week.

## 2021-07-30 ENCOUNTER — Other Ambulatory Visit (HOSPITAL_COMMUNITY): Payer: Self-pay

## 2021-07-30 ENCOUNTER — Observation Stay (HOSPITAL_BASED_OUTPATIENT_CLINIC_OR_DEPARTMENT_OTHER): Payer: Self-pay

## 2021-07-30 DIAGNOSIS — I2609 Other pulmonary embolism with acute cor pulmonale: Secondary | ICD-10-CM

## 2021-07-30 DIAGNOSIS — E1165 Type 2 diabetes mellitus with hyperglycemia: Secondary | ICD-10-CM

## 2021-07-30 DIAGNOSIS — I82401 Acute embolism and thrombosis of unspecified deep veins of right lower extremity: Secondary | ICD-10-CM

## 2021-07-30 LAB — CBC
HCT: 36.7 % (ref 36.0–46.0)
Hemoglobin: 12.3 g/dL (ref 12.0–15.0)
MCH: 29.3 pg (ref 26.0–34.0)
MCHC: 33.5 g/dL (ref 30.0–36.0)
MCV: 87.4 fL (ref 80.0–100.0)
Platelets: 242 10*3/uL (ref 150–400)
RBC: 4.2 MIL/uL (ref 3.87–5.11)
RDW: 13 % (ref 11.5–15.5)
WBC: 6.6 10*3/uL (ref 4.0–10.5)
nRBC: 0 % (ref 0.0–0.2)

## 2021-07-30 LAB — ECHOCARDIOGRAM COMPLETE
Area-P 1/2: 7.16 cm2
Height: 66 in
S' Lateral: 2.4 cm
Weight: 3386.27 oz

## 2021-07-30 LAB — BASIC METABOLIC PANEL
Anion gap: 12 (ref 5–15)
Anion gap: 7 (ref 5–15)
BUN: 9 mg/dL (ref 6–20)
BUN: 10 mg/dL (ref 6–20)
CO2: 17 mmol/L — ABNORMAL LOW (ref 22–32)
CO2: 19 mmol/L — ABNORMAL LOW (ref 22–32)
Calcium: 8.5 mg/dL — ABNORMAL LOW (ref 8.9–10.3)
Calcium: 8 mg/dL — ABNORMAL LOW (ref 8.9–10.3)
Chloride: 108 mmol/L (ref 98–111)
Chloride: 105 mmol/L (ref 98–111)
Creatinine, Ser: 0.69 mg/dL (ref 0.44–1.00)
Creatinine, Ser: 0.81 mg/dL (ref 0.44–1.00)
GFR, Estimated: >60 mL/min (ref >60)
GFR, Estimated: >60 mL/min (ref >60)
Glucose, Bld: 286 mg/dL — ABNORMAL HIGH (ref 70–99)
Glucose, Bld: 384 mg/dL — ABNORMAL HIGH (ref 70–99)
Potassium: 4.2 mmol/L (ref 3.5–5.1)
Potassium: 4.1 mmol/L (ref 3.5–5.1)
Sodium: 137 mmol/L (ref 135–145)
Sodium: 131 mmol/L — ABNORMAL LOW (ref 135–145)

## 2021-07-30 LAB — GLUCOSE, CAPILLARY
Glucose-Capillary: 426 mg/dL — ABNORMAL HIGH (ref 70–99)
Glucose-Capillary: 373 mg/dL — ABNORMAL HIGH (ref 70–99)
Glucose-Capillary: 306 mg/dL — ABNORMAL HIGH (ref 70–99)
Glucose-Capillary: 222 mg/dL — ABNORMAL HIGH (ref 70–99)
Glucose-Capillary: 282 mg/dL — ABNORMAL HIGH (ref 70–99)

## 2021-07-30 LAB — HEPARIN LEVEL (UNFRACTIONATED): Heparin Unfractionated: 0.58 IU/mL (ref 0.30–0.70)

## 2021-07-30 MED ORDER — ACCU-CHEK SOFTCLIX LANCETS MISC
0 refills | Status: AC
  Filled 2021-07-30: qty 100, 25d supply, fill #0

## 2021-07-30 MED ORDER — INSULIN PEN NEEDLE 32G X 4 MM MISC
0 refills | Status: AC
  Filled 2021-07-30: qty 100, 25d supply, fill #0

## 2021-07-30 MED ORDER — ONDANSETRON 4 MG PO TBDP
4.0000 mg | ORAL_TABLET | Freq: Three times a day (TID) | ORAL | Status: DC | PRN
  Administered 2021-07-30: 4 mg via ORAL
  Filled 2021-07-30: qty 1

## 2021-07-30 MED ORDER — INSULIN ASPART 100 UNIT/ML IJ SOLN
0.0000 [IU] | Freq: Three times a day (TID) | INTRAMUSCULAR | Status: DC
  Administered 2021-07-30: 11 [IU] via SUBCUTANEOUS
  Administered 2021-07-30 – 2021-07-31 (×2): 5 [IU] via SUBCUTANEOUS
  Administered 2021-07-31: 11 [IU] via SUBCUTANEOUS
  Administered 2021-07-31 – 2021-08-01 (×2): 5 [IU] via SUBCUTANEOUS

## 2021-07-30 MED ORDER — RIVAROXABAN 20 MG PO TABS: 20.0000 mg | ORAL_TABLET | Freq: Every day | ORAL | Status: DC

## 2021-07-30 MED ORDER — BLOOD GLUCOSE MONITOR SYSTEM W/DEVICE KIT
PACK | 0 refills | Status: AC
  Filled 2021-07-30: qty 1, 30d supply, fill #0

## 2021-07-30 MED ORDER — GLUCOSE BLOOD VI STRP
ORAL_STRIP | Freq: Every day | 0 refills | Status: AC
  Filled 2021-07-30: qty 100, 25d supply, fill #0

## 2021-07-30 MED ORDER — RIVAROXABAN 15 MG PO TABS
15.0000 mg | ORAL_TABLET | Freq: Two times a day (BID) | ORAL | Status: DC
  Administered 2021-07-30 – 2021-08-01 (×4): 15 mg via ORAL
  Filled 2021-07-30 (×4): qty 1

## 2021-07-30 MED ORDER — RIVAROXABAN (XARELTO) VTE STARTER PACK (15 & 20 MG)
ORAL_TABLET | ORAL | 0 refills | Status: DC
  Filled 2021-07-30: qty 51, 30d supply, fill #0

## 2021-07-30 MED ORDER — INSULIN GLARGINE-YFGN 100 UNIT/ML ~~LOC~~ SOLN
15.0000 [IU] | Freq: Every day | SUBCUTANEOUS | Status: DC
  Administered 2021-07-30: 15 [IU] via SUBCUTANEOUS
  Filled 2021-07-30 (×2): qty 0.15

## 2021-07-30 MED ORDER — RIVAROXABAN 15 MG PO TABS
15.0000 mg | ORAL_TABLET | ORAL | Status: AC
  Administered 2021-07-30: 15 mg via ORAL
  Filled 2021-07-30: qty 1

## 2021-07-30 MED ORDER — INSULIN GLARGINE 100 UNIT/ML SOLOSTAR PEN
15.0000 [IU] | PEN_INJECTOR | Freq: Every day | SUBCUTANEOUS | 0 refills | Status: DC
Start: 2021-07-30 — End: 2021-08-01
  Filled 2021-07-30: qty 6, 34d supply, fill #0

## 2021-07-30 NOTE — Plan of Care (Signed)
  Problem: Education: Goal: Knowledge of General Education information will improve Description: Including pain rating scale, medication(s)/side effects and non-pharmacologic comfort measures 07/30/2021 1425 by Grover Canavan, RN Outcome: Not Progressing 07/30/2021 1424 by Grover Canavan, RN Outcome: Progressing   Problem: Health Behavior/Discharge Planning: Goal: Ability to manage health-related needs will improve 07/30/2021 1425 by Grover Canavan, RN Outcome: Not Progressing 07/30/2021 1424 by Grover Canavan, RN Outcome: Progressing   Problem: Clinical Measurements: Goal: Ability to maintain clinical measurements within normal limits will improve 07/30/2021 1425 by Grover Canavan, RN Outcome: Not Progressing 07/30/2021 1424 by Grover Canavan, RN Outcome: Progressing Goal: Diagnostic test results will improve 07/30/2021 1425 by Grover Canavan, RN Outcome: Not Progressing 07/30/2021 1424 by Grover Canavan, RN Outcome: Progressing   Problem: Nutrition: Goal: Adequate nutrition will be maintained 07/30/2021 1425 by Grover Canavan, RN Outcome: Not Progressing 07/30/2021 1424 by Grover Canavan, RN Outcome: Progressing   Problem: Coping: Goal: Level of anxiety will decrease 07/30/2021 1425 by Grover Canavan, RN Outcome: Not Progressing 07/30/2021 1424 by Grover Canavan, RN Outcome: Progressing   Problem: Education: Goal: Ability to describe self-care measures that may prevent or decrease complications (Diabetes Survival Skills Education) will improve Outcome: Not Progressing   Problem: Nutritional: Goal: Maintenance of adequate nutrition will improve Outcome: Not Progressing Goal: Progress toward achieving an optimal weight will improve Outcome: Not Progressing

## 2021-07-30 NOTE — Progress Notes (Signed)
CBG of 426. FMTS paged at [phone].   Call returned at 0805 by Saintclair Halsted MD. See new orders.   CBG rechecked. 373. Novolog administered. See MAR.   Patient noted to have a box of [employer] cinnamon rolls in room. Pt endorses she does not understand why her sugars are so high because she hasn't eaten anything.   RN educated pt on elevated A1C.

## 2021-07-30 NOTE — TOC Benefit Eligibility Note (Signed)
Patient Advocate Encounter  Insurance verification completed.    The patient is uninsured  Correen Bubolz, CPhT Pharmacy Patient Advocate Specialist [facility] Patient Advocate Team Direct Number: [phone]  Fax: [phone]        

## 2021-07-30 NOTE — Progress Notes (Signed)
ANTICOAGULATION CONSULT NOTE - Follow Up Consult  Pharmacy Consult for heparin Indication:  PE  No Known Allergies  Patient Measurements: Height: 5\' 6"  (167.6 cm) Weight: 96 kg (211 lb 10.3 oz) IBW/kg (Calculated) : 59.3 Heparin Dosing Weight: 80.7 kg  Vital Signs: Temp: 98.5 F (36.9 C) (10/28 0248) Temp Source: Oral (10/28 0248) BP: 109/83 (10/28 0248) Pulse Rate: 113 (10/28 0248)  Labs: Recent Labs    07/29/21 1029 07/29/21 1037 07/29/21 1805 07/29/21 2120 07/30/21 0539  HGB 15.6*  --   --   --  12.3  HCT 47.3*  --   --   --  36.7  PLT 242  --   --   --  242  LABPROT  --  13.4  --   --   --   INR  --  1.0  --   --   --   HEPARINUNFRC  --   --  1.00* <0.10* 0.58  CREATININE 0.76  --   --   --   --   TROPONINIHS 10  --  12  --   --     Estimated Creatinine Clearance: 122.3 mL/min (by C-G formula based on SCr of 0.76 mg/dL).   Assessment: 30 yo F with URI and dyspnea, hx of DVT in past, not on any current AC. New found acute PE on CTA - with RHS, RV/LV ratio 1.17.  Heparin level is therapeutic at 0.58 @1700  units/hr. Level was drawn appropriately. CBC stable. Tentative plans are to switch patient to oral anticoagulation later today.    Goal of Therapy:  Heparin level 0.3-0.7 units/ml Monitor platelets by anticoagulation protocol: Yes   Plan:  Continue heparin @ 1700 units/hr Check anti-Xa level in 6 hours and daily while on heparin F/up CBC, SpO2, and plan to switch to oral anticoagulant  26, Pharm.D. PGY-1 Pharmacy Resident [phone] 07/30/2021 7:55 AM

## 2021-07-30 NOTE — Progress Notes (Signed)
CBG of 426, no sliding scale available . MD paged and made aware. Awaiting orders.

## 2021-07-30 NOTE — Plan of Care (Signed)

## 2021-07-30 NOTE — Progress Notes (Signed)
Family Medicine Teaching Service Daily Progress Note Intern Pager: [phone]  Patient name: Annette [name] Medical record number: [id number] Date of birth: 08-16-1991 Age: 30 y.o. Gender: female  Primary Care Provider: Derrel Nip, MD Consultants: Pulmonology Code Status: Full  Pt Overview and Major Events to Date:  07/29/2021-admitted  Assessment and Plan: Annette [name] is a 30 year old female who presented with dyspnea, chest pain, left calf pain and found to have acute DVT and PE.  Past medical history significant for prior DVT, PE, type 2 diabetes and unintentional acetaminophen overdose  Acute PE and left lower extremity DVT CT a chest from yesterday showed bilateral pulmonary emboli with right heart strain and LE Doppler was positive for left lower extremity DVT.  Patient has no obvious risk factor for DVT/PE and prior PE in 2020 was in the setting of COVID-19.  PCCM was consulted.  Patient was started on heparin drip overnight.  PCCM recommends if patient is stable today she can transition to DOAC and that she needs lifelong anticoagulation.  PCCM also recommends that if patient were to decompensate, consider systemic tPA.  BNP night elevated to 189.7.  This morning patient remains tachycardic 116  Patient remains afebrile with oxygen saturations in low 90's on room air.  Patient states she feels better today, less SOB and chest pain only with coughing. CBC WNL, BMP pending. Heparin within therapeutic level.  -Pulmonary has been consulted, appreciate recs - d/c Heparin gtt -Start oral DOAC, will look into best choices based on insurance and access  -Daily BMP, CBC, and Heparin level -Continuous cardiorespiratory monitoring -PT/OT eval and treat  Uncontrolled type 2 diabetes On admission patient's glucose was 421, A1c yesterday of 13.3.  Glucose at 0623 this morning of 426 and down to 373 at 0849.  Patient is not currently on any home medications. -Semglee 15 units this  morning -Sensitive SSI -CBGs before every meal and nightly -Diabetic educator consult  FEN/GI: carb modified PPx: heparin Dispo:Home tomorrow. Barriers include diabetes education and optimize home meds.   Subjective:  Patient states she feels much better today than yesterday.  Shortness of breath is improved and patient only has chest pain when coughing.  Denies any abdominal pain this morning and is eating a little, appetite still not great.  Objective: Temp:  [98.2 F (36.8 C)-99.3 F (37.4 C)] 98.5 F (36.9 C) (10/28 0248) Pulse Rate:  [108-133] 113 (10/28 0248) Resp:  [16-27] 18 (10/28 0248) BP: (106-154)/(71-111) 109/83 (10/28 0248) SpO2:  [94 %-99 %] 94 % (10/28 0248) Weight:  [96 kg] 96 kg (10/27 1125) Physical Exam: General: Patient lying in bed, NAD Cardiovascular: Regular rhythm, tachycardic, normal S1/S2 Respiratory: CTAB, no wheezing or crackles, normal work of breathing on room air Extremities: No edema BLEs  Laboratory: Recent Labs  Lab 07/29/21 1029  WBC 5.9  HGB 15.6*  HCT 47.3*  PLT 242   Recent Labs  Lab 07/29/21 1029  NA 133*  K 3.9  CL 102  CO2 18*  BUN 9  CREATININE 0.76  CALCIUM 9.0  GLUCOSE 421Erick Alley, DO 07/30/2021, 5:02 AM PGY-1, [facility] Family Medicine FPTS Intern pager: [phone], text pages welcome

## 2021-07-30 NOTE — Evaluation (Signed)
Physical Therapy Evaluation Patient Details Name: Annette [name] MRN: [id number] DOB: [birth year] Today's Date: 07/30/2021  History of Present Illness  The pt is a 30 yo female presenting 10/27 with SOB and chest pain x1 week. CT revealed acute PE and right heart strain. LE evaluation revealed acute LLE DVT. PMH includes: uncontrolled DM II, unintentional acetaminophen overdose, DVT, and PE.   Clinical Impression  Pt in bed upon arrival of PT, agreeable to evaluation at this time. Prior to admission the pt was completely independent, living in 3rd floor apt. The pt now presents with limitations in functional mobility and endurance due to above dx, and will continue to benefit from skilled PT to address these deficits. She demos good independence with bed mobility and initial sit-stand transfers without need for assist or DME, but does benefit from slowed pace and cues for deeper breathing with ambulation. The pt was able to complete 75 ft ambulation without need for UE support or assist, but was limited by fatigue and HR elevation to 130s. The pt completed x5 step-ups with single rail and recovered standing rest after this activity, and therefore we discussed energy conservation techniques as well as strategies to facilitate activity needed to return home. The pt and her mother verbalized agreement to all education and strategies. No PT needs following d/c, but will continue to work on endurance while pt admitted.      Recommendations for follow up therapy are one component of a multi-disciplinary discharge planning process, led by the attending physician.  Recommendations may be updated based on patient status, additional functional criteria and insurance authorization.  Follow Up Recommendations No PT follow up    Assistance Recommended at Discharge Intermittent Supervision/Assistance  Functional Status Assessment Patient has had a recent decline in their functional status and demonstrates the  ability to make significant improvements in function in a reasonable and predictable amount of time.  Equipment Recommendations  None recommended by PT    Recommendations for Other Services       Precautions / Restrictions Precautions Precaution Comments: watch SpO2 Restrictions Weight Bearing Restrictions: No      Mobility  Bed Mobility Overal bed mobility: Independent                  Transfers Overall transfer level: Independent Equipment used: None               General transfer comment: no assist, pt independent with mobility in the room    Ambulation/Gait Ambulation/Gait assistance: Supervision Gait Distance (Feet): 75 Feet Assistive device: None Gait Pattern/deviations: WFL(Within Functional Limits) Gait velocity: decreased Gait velocity interpretation: <1.31 ft/sec, indicative of household ambulator General Gait Details: pt slow but steady, cues for PLB and self-monitoring rest breaks. no LOB or need for assist  Stairs Stairs: Yes Stairs assistance: Supervision Stair Management: One rail Left;Step to pattern;Forwards Number of Stairs: 5 General stair comments: HR to 140bpm, SpO2 to 88% but improved rapidly to 92% with guided breathing.  Wheelchair Mobility    Modified Rankin (Stroke Patients Only)       Balance Overall balance assessment: Independent                                           Pertinent Vitals/Pain Pain Assessment: No/denies pain    Home Living Family/patient expects to be discharged to:: Private residence Living Arrangements: Other relatives Available Help at  Discharge: Family;Available 24 hours/day Type of Home: Apartment Home Access: Stairs to enter Entrance Stairs-Rails: Doctor, general practice of Steps: 3 flights   Home Layout: One level Home Equipment: Agricultural consultant (2 wheels)      Prior Function Prior Level of Function : Independent/Modified Independent              Mobility Comments: increased fatigue and SOB reccently, otherwise independent with no need for DME       Hand Dominance   Dominant Hand: Right    Extremity/Trunk Assessment   Upper Extremity Assessment Upper Extremity Assessment: Defer to OT evaluation    Lower Extremity Assessment Lower Extremity Assessment: Defer to PT evaluation    Cervical / Trunk Assessment Cervical / Trunk Assessment: Normal  Communication   Communication: No difficulties  Cognition Arousal/Alertness: Awake/alert Behavior During Therapy: WFL for tasks assessed/performed Overall Cognitive Status: Within Functional Limits for tasks assessed                                          General Comments General comments (skin integrity, edema, etc.): VSS on RA, 90-94% with gait but secrease to low of 88% with fatigue. improves with focus on breathing        Assessment/Plan    PT Assessment Patient needs continued PT services  PT Problem List Decreased activity tolerance;Decreased balance;Decreased mobility       PT Treatment Interventions DME instruction;Gait training;Stair training;Functional mobility training;Therapeutic activities;Therapeutic exercise;Balance training;Patient/family education    PT Goals (Current goals can be found in the Care Plan section)  Acute Rehab PT Goals Patient Stated Goal: return home PT Goal Formulation: With patient Time For Goal Achievement: 08/13/21 Potential to Achieve Goals: Good    Frequency Min 3X/week    AM-PAC PT "6 Clicks" Mobility  Outcome Measure Help needed turning from your back to your side while in a flat bed without using bedrails?: None Help needed moving from lying on your back to sitting on the side of a flat bed without using bedrails?: None Help needed moving to and from a bed to a chair (including a wheelchair)?: None Help needed standing up from a chair using your arms (e.g., wheelchair or bedside chair)?: None Help  needed to walk in hospital room?: None Help needed climbing 3-5 steps with a railing? : A Little 6 Click Score: 23    End of Session Equipment Utilized During Treatment: Gait belt Activity Tolerance: Patient tolerated treatment well Patient left: in bed;with call bell/phone within reach;with family/visitor present Nurse Communication: Mobility status PT Visit Diagnosis: Other abnormalities of gait and mobility (R26.89)    Time: 1325-1340 PT Time Calculation (min) (ACUTE ONLY): 15 min   Charges:   PT Evaluation $PT Eval Low Complexity: 1 Low          Vickki Muff, PT, DPT   Acute Rehabilitation Department Pager #: [phone]  Ronnie Derby 07/30/2021, 2:01 PM

## 2021-07-30 NOTE — Progress Notes (Signed)
FPTS Interim Progress Note  S: patient found resting comfortably. Satting 91% on RA.    O: BP 121/83 (BP Location: Right Arm)   Pulse (!) 111   Temp 98.4 F (36.9 C) (Oral)   Resp 18   Ht 5\' 6"  (1.676 m)   Wt 211 lb 10.3 oz (96 kg)   LMP 07/27/2021   SpO2 92%   BMI 34.16 kg/m    Physical exam General: NAD, resting peacefully Pulmonary: unlabored respirations  A/P: 30 year old female with PMHx of PE during Covid -19 infection, self-d/c'd Eliquis, found to have submassive PE with right heart strain and positive LLE DVT. Watching BP closely, SBP from 121-138. Persistent tachycardia around 110 BPM. Patient was transitioned to Xarelto earlier today.  -No changes, remainder per day team   26, MD PGY-1 FPTS Intern pager: [phone], text pages welcome

## 2021-07-30 NOTE — Progress Notes (Signed)
  Echocardiogram 2D Echocardiogram has been performed.  Augustine Radar 07/30/2021, 10:41 AM

## 2021-07-30 NOTE — Progress Notes (Addendum)
Spoke with patient at the bedside. When entering the room, the nutrition representative was removing a large bag of [employer] food and drink to make room for the patient's lunch tray.  Discussed with patient the need to eat only what she is served at the hospital so that we can regulate her blood sugars. Patient states that she was diagnosed with diabetes at age 30 and was started on oral agents at that time. States that she has been on Lantus in the past and used the insulin pen at that time. She is not taking any medication for diabetes at this time. States that she works an overnight job on the weekends and a week job at [employer] from 9-5 Monday through Friday. Patient seems to be agreeable to taking insulin at home. Patient has a home blood glucose meter. Discussed the HgbA1C of 13.3% with patient and how to make it better.   Spoke with Dr. Clent Ridges to see what the plan is for this patient. She will be followed at the [facility] after discharge, will hopefully have medication assistance. Family Medicine looking into discharge plan for the weekend. Patient will need extensive diabetes teaching at the clinic.  Smith Mince RN BSN CDE Diabetes Coordinator Pager: [phone]  8am-5pm

## 2021-07-30 NOTE — TOC Progression Note (Addendum)
Transition of Care [facility]) - Progression Note    Patient Details  Name: Annette [name] MRN: [id number] Date of Birth: Mar 11, 1991  [facility]) CM/SW Contact  Leone Haven, RN Phone Number: 07/30/2021, 12:59 PM  Clinical Narrative:    Patient with no insurance or PCP,  Secretary will schedule hospital follow up apt with [facility] clinic or Primary Care at [facility].  This NCM will assist with Match for meds for today for [facility] pharmacy.  Enrique Sack with diabetes states patient will be going to [facility] for follow up, so canceled the [facility] clinic apt. The Family practice clinic closed at 12:30 so patient will have to make her own follow up apt on Monday.  TOC filled her medications for her and they are in the Main pharmacy. She will need to get these before she is discharged.        Expected Discharge Plan and Services                                                 Social Determinants of Health (SDOH) Interventions    Readmission Risk Interventions No flowsheet data found.

## 2021-07-30 NOTE — Progress Notes (Signed)
  FMTS Brief Note   Called that patient wished to leave today. In to see patient and mother. Discussed recommendation for additional day of monitoring given echocardiogram findings, clinical picture. Agreeable to staying. Requests medication for nausea (occurs after eating at time). HCG negative, Qtc appropriate. ODT Zofran ordered.  Terisa Starr, MD  Family Medicine Teaching Service

## 2021-07-31 LAB — CBC
HCT: 38.4 % (ref 36.0–46.0)
Hemoglobin: 12.6 g/dL (ref 12.0–15.0)
MCH: 28.6 pg (ref 26.0–34.0)
MCHC: 32.8 g/dL (ref 30.0–36.0)
MCV: 87.3 fL (ref 80.0–100.0)
Platelets: 283 10*3/uL (ref 150–400)
RBC: 4.4 MIL/uL (ref 3.87–5.11)
RDW: 13.2 % (ref 11.5–15.5)
WBC: 5.8 10*3/uL (ref 4.0–10.5)
nRBC: 0 % (ref 0.0–0.2)

## 2021-07-31 LAB — BASIC METABOLIC PANEL
Anion gap: 8 (ref 5–15)
BUN: 10 mg/dL (ref 6–20)
CO2: 21 mmol/L — ABNORMAL LOW (ref 22–32)
Calcium: 8.4 mg/dL — ABNORMAL LOW (ref 8.9–10.3)
Chloride: 108 mmol/L (ref 98–111)
Creatinine, Ser: 0.75 mg/dL (ref 0.44–1.00)
GFR, Estimated: >60 mL/min (ref >60)
Glucose, Bld: 296 mg/dL — ABNORMAL HIGH (ref 70–99)
Potassium: 3.9 mmol/L (ref 3.5–5.1)
Sodium: 137 mmol/L (ref 135–145)

## 2021-07-31 LAB — GLUCOSE, CAPILLARY
Glucose-Capillary: 301 mg/dL — ABNORMAL HIGH (ref 70–99)
Glucose-Capillary: 250 mg/dL — ABNORMAL HIGH (ref 70–99)
Glucose-Capillary: 243 mg/dL — ABNORMAL HIGH (ref 70–99)
Glucose-Capillary: 284 mg/dL — ABNORMAL HIGH (ref 70–99)

## 2021-07-31 MED ORDER — INSULIN GLARGINE-YFGN 100 UNIT/ML ~~LOC~~ SOLN
20.0000 [IU] | Freq: Every day | SUBCUTANEOUS | Status: DC
  Administered 2021-07-31: 20 [IU] via SUBCUTANEOUS
  Filled 2021-07-31 (×2): qty 0.2

## 2021-07-31 NOTE — Progress Notes (Signed)
Inpatient Diabetes Program Recommendations  AACE/ADA: New Consensus Statement on Inpatient Glycemic Control   Target Ranges:  Prepandial:   less than 140 mg/dL      Peak postprandial:   less than 180 mg/dL (1-2 hours)      Critically ill patients:  140 - 180 mg/dL   Results for MYLIA, PONDEXTER (MRN [id number]) as of 07/31/2021 11:07  Ref. Range 07/30/2021 06:23 07/30/2021 08:49 07/30/2021 11:52 07/30/2021 16:02 07/30/2021 21:03 07/31/2021 07:03  Glucose-Capillary Latest Ref Range: 70 - 99 mg/dL 546 (H) 568 (H) 127 (H) 222 (H) 282 (H) 301 (H)   Review of Glycemic Control  Current orders for Inpatient glycemic control: Semglee 20 units daily, Novolog 0-15 units TID with meals  Inpatient Diabetes Program Recommendations:    Insulin: Noted Semglee increased from 15 to 20 units daily. Please consider adding Novolog 0-5 units QHS for bedtime correction, and Novolog 6 units TID with meals for meal coverage if patient eats at least 50% of meals.  Thanks, Orlando Penner, RN, MSN, CDE Diabetes Coordinator Inpatient Diabetes Program [phone] (Team Pager from 8am to 5pm)

## 2021-07-31 NOTE — Discharge Instructions (Addendum)
Dear Annette [name],  Thank you for letting us participate in your care. You were hospitalized because you had blood clots in your left leg and in your lung.  You were treated with blood thinners. Your blood sugars were very high when you were admitted to the hospital and you were treated with insulin.It is very important that you follow up with your primary care doctor to manage your blood sugars. Please check a fasting glucose each morning until you follow up with your doctor. You should also check your sugars if you begin to feel bad. If you sugars drop below 80, please call your doctor or go to the emergency room.  If you have any shortness of breath or chest pain go to the emergency department as soon as possible or call 911  POST-HOSPITAL & CARE INSTRUCTIONS Take your medications as prescribed. You have been prescribed a blood thinner called Rivaroxaban (xarelto) and insulin glargine(Lantus).  Your insulin was increased to 30 units daily.  Go to your follow up appointments (listed below)   DOCTOR'S APPOINTMENT   Future Appointments  Date Time Provider Department Center  08/04/2021 11:10 AM ACCESS TO CARE POOL [facility]    Follow-up Information     [facility] Follow up.   Why: please call to schedule your apt Contact information: [address] [place] [address] [phone]                Take care and be well!  Family Medicine Teaching Service Inpatient Team [place]  [facility]  [address] [place], [place] [address] [phone]

## 2021-07-31 NOTE — Progress Notes (Signed)
Physical Therapy Treatment Patient Details Name: Annette [name] MRN: [id number] DOB: 03-Jun-1991 Today's Date: 07/31/2021   History of Present Illness The pt is a 30 yo female presenting 10/27 with SOB and chest pain x1 week. CT revealed acute PE and right heart strain. LE evaluation revealed acute LLE DVT. PMH includes: uncontrolled DM II, unintentional acetaminophen overdose, DVT, and PE.    PT Comments    The pt was able to demo good mobility this session, but was unable to recover SpO2 as well as she did during ambulation yesterday. Therefore, the pt required 1L O2 to maintain SpO2 >92% with gait and stairs. Remains able to complete hallway ambulation and stairs without physical assist or DME, and was able to demo good practice of breathing techniques taught in yesterday's session. Continue to recommend d/c home with family when medically stable.   HR max: 136bpm with stairs; 126bpm with gait SpO2: 94% on RA at rest, low of 88% on RA with gait, 92-97% on 1L O2 with gait and stairs    Recommendations for follow up therapy are one component of a multi-disciplinary discharge planning process, led by the attending physician.  Recommendations may be updated based on patient status, additional functional criteria and insurance authorization.  Follow Up Recommendations  No PT follow up     Assistance Recommended at Discharge Intermittent Supervision/Assistance  Equipment Recommendations  Other (comment) (O2)    Recommendations for Other Services       Precautions / Restrictions Precautions Precautions: None Precaution Comments: watch SpO2 Restrictions Weight Bearing Restrictions: No     Mobility  Bed Mobility Overal bed mobility: Independent                  Transfers Overall transfer level: Independent Equipment used: None               General transfer comment: no assist, pt independent with mobility in the room    Ambulation/Gait Ambulation/Gait  assistance: Supervision Gait Distance (Feet): 150 Feet Assistive device: None Gait Pattern/deviations: WFL(Within Functional Limits) Gait velocity: decreased Gait velocity interpretation: <1.31 ft/sec, indicative of household ambulator General Gait Details: pt slow but steady, cues for PLB and self-monitoring rest breaks. no LOB or need for assist   Stairs Stairs: Yes Stairs assistance: Supervision Stair Management: One rail Left;Step to pattern;Forwards Number of Stairs: 5 (x2) General stair comments: HR to 136 bpm, recovered to 120s with standing rest. SpO2 94-97% on 1L O2      Balance Overall balance assessment: Mild deficits observed, not formally tested                                          Cognition Arousal/Alertness: Awake/alert Behavior During Therapy: WFL for tasks assessed/performed Overall Cognitive Status: Within Functional Limits for tasks assessed                                 General Comments: [facility]        Exercises      General Comments General comments (skin integrity, edema, etc.): VSS on RA at rest, drops to 88% on RA with gait. the pt is able to recover to 90% with intentional breathing and standing rest. maintained 92-97% on 1L with ambulation      Pertinent Vitals/Pain Pain Assessment: No/denies pain     PT  Goals (current goals can now be found in the care plan section) Acute Rehab PT Goals Patient Stated Goal: return home PT Goal Formulation: With patient Time For Goal Achievement: 08/13/21 Potential to Achieve Goals: Good Progress towards PT goals: Progressing toward goals    Frequency    Min 3X/week      PT Plan Current plan remains appropriate       AM-PAC PT "6 Clicks" Mobility   Outcome Measure  Help needed turning from your back to your side while in a flat bed without using bedrails?: None Help needed moving from lying on your back to sitting on the side of a flat bed without using  bedrails?: None Help needed moving to and from a bed to a chair (including a wheelchair)?: None Help needed standing up from a chair using your arms (e.g., wheelchair or bedside chair)?: None Help needed to walk in hospital room?: None Help needed climbing 3-5 steps with a railing? : A Little 6 Click Score: 23    End of Session Equipment Utilized During Treatment: Gait belt;Oxygen Activity Tolerance: Patient tolerated treatment well Patient left: with call bell/phone within reach;with family/visitor present;in chair Nurse Communication: Mobility status PT Visit Diagnosis: Other abnormalities of gait and mobility (R26.89)     Time: 9563-8756 PT Time Calculation (min) (ACUTE ONLY): 20 min  Charges:  $Gait Training: 8-22 mins                     Vickki Muff, PT, DPT   Acute Rehabilitation Department Pager #: [phone]   Ronnie Derby 07/31/2021, 1:26 PM

## 2021-07-31 NOTE — Progress Notes (Signed)
Pt ambulated in a hallway with oxygen saturation 87 and 88% in RA  HR is 130's and coming back to room, HR is in 120's and oxygen saturation 88 in RA, oxygen 2l given via [place] and came back to 92.  Pt said she felt fine and did not felt chest and distress  Lonia Farber, RN

## 2021-07-31 NOTE — Progress Notes (Signed)
Pt O2 sats dropped down and maintained at mid 80's. 2L of O2 via nasal cannula started. O2 came back up to low 90's. Will continue to monitor.

## 2021-07-31 NOTE — Progress Notes (Addendum)
Family Medicine Teaching Service Daily Progress Note Intern Pager: [phone]  Patient name: Annette [name] Medical record number: [id number] Date of birth: 29-Apr-1991 Age: 30 y.o. Gender: female  Primary Care Provider: Gifford Shave, MD Consultants: Pulmonology Code Status: Full  Pt Overview and Major Events to Date:  07/30/2019-admitted  Assessment and Plan: Annette [name] is a 30 year old female who presented with dyspnea, chest pain, left calf pain and found to have acute DVT and PE.  Past medical history significant for prior DVT, PE, type 2 diabetes and unintentional acetaminophen overdose  Acute PE and left lower extremity DVT This is patient's second PE, prior PE was in 2020 in the setting of COVID-19.  Yesterday patient was transitioned from heparin drip to oral DOAC.  Overnight patient had desaturations into the mid 80s on room air.  Oxygen came up to low 90s with 2 L via [place].  This morning patient is on RA with oxygen saturation of 90, heart rate of 118 and respiratory rate of 24 and states she does not feel short of breath. Pt has remained afebrile with normal WBC, not concerned for PNA at this time.  -Pulmonary consulted -Xarelto 15 mg twice daily for 41 doses -Xarelto 20 mg daily starting on 11/18/202 -Obtain Ambulatory oxygen sats -Follow-up with hematology outpatient   Uncontrolled type 2 diabetes A1c on 07/29/2021 of 13.3.  Glucose this morning of 301.  Yesterday patient received 15 units Semglee and 25 units aspart.  Patient met with diabetes educator yesterday.  Patient will follow-up outpatient at family medicine clinic for further diabetes teaching. -Increase Semglee to 20 units -Sensitive SSI -CBGs before every meal nightly    FEN/GI: Carb modified PPx: Xarelto Dispo:Home tomorrow to monitor respiratory status  Subjective:  Patient states she does not feel there is any reason for her to be here.  She denies shortness of breath, chest pain.  Admits to frequent  cough producing pale yellow sputum.  I explained to patient that last night she had desaturations into the mid 80s and required 2 L of oxygen.  I explained that we may need to monitor her further.  Patient continues to state that she does not wish to stay in the hospital today.  Objective: Temp:  [98.2 F (36.8 C)-98.8 F (37.1 C)] 98.2 F (36.8 C) (10/29 0343) Pulse Rate:  [93-119] 119 (10/29 0343) Resp:  [18-25] 25 (10/29 0343) BP: (115-138)/(64-92) 126/68 (10/29 0343) SpO2:  [90 %-96 %] 92 % (10/29 0528) Physical Exam: General: Patient lying comfortably in bed, NAD Cardiovascular: Tachycardic, regular rhythm Respiratory: CTAB, good air movement Extremities: No edema BLEs  Laboratory: Recent Labs  Lab 07/29/21 1029 07/30/21 0539 07/31/21 0600  WBC 5.9 6.6 5.8  HGB 15.6* 12.3 12.6  HCT 47.3* 36.7 38.4  PLT 242 242 283   Recent Labs  Lab 07/30/21 0857 07/30/21 1216 07/31/21 0600  NA 137 131* 137  K 4.2 4.1 3.9  CL 108 105 108  CO2 17* 19* 21*  BUN 9 10 10   CREATININE 0.69 0.81 0.75  CALCIUM 8.5* 8.0* 8.4*  GLUCOSE 286* 384* 296Precious Gilding, DO 07/31/2021, 7:45 AM PGY-1, [facility] [place] [facility] Intern pager: [phone], text pages welcome

## 2021-07-31 NOTE — Plan of Care (Signed)

## 2021-07-31 NOTE — Progress Notes (Signed)
FPTS Interim Progress Note  S: Patient sitting comfortably in bed, mother also present at bedside. Denies any concerns, she feels she is ready to go back home tomorrow as her mother has to return back to Annette [name]. Patient was able to go to the restroom and get back in bed without desaturations while on room air per mother. Patient denies chest pain and dyspnea.   O: BP 109/78 (BP Location: Right Arm)   Pulse (!) 120   Temp 98.5 F (36.9 C) (Oral)   Resp 18   Ht 5\' 6"  (1.676 m)   Wt 96 kg   LMP 07/27/2021   SpO2 95%   BMI 34.16 kg/m   General: Patient sitting upright in bed talking, in no acute distress. CV: RRR, no murmurs or gallops auscultated  Resp: CTAB, no wheezing or rales appreciated, on 0.5 L O2 satting at 93%  Abdomen: soft, nontender, nondistended, presence of bowel sounds  Ext: radial and distal pulses strong and equal bilaterally, no LE edema noted bilaterally Psych: mood appropriate   A/P: 30 year old female with past medical history of PE found to have submassive PE with right heart strain with LLE positive for DVT.  -continue current plan per day team, plan to discharge likely tomorrow, patient amendable to staying the night   26, DO 07/31/2021, 9:23 PM PGY-2, [facility] Family Medicine Service pager [phone]

## 2021-08-01 DIAGNOSIS — I2699 Other pulmonary embolism without acute cor pulmonale: Secondary | ICD-10-CM

## 2021-08-01 LAB — BASIC METABOLIC PANEL
Anion gap: 8 (ref 5–15)
BUN: 9 mg/dL (ref 6–20)
CO2: 20 mmol/L — ABNORMAL LOW (ref 22–32)
Calcium: 8.2 mg/dL — ABNORMAL LOW (ref 8.9–10.3)
Chloride: 107 mmol/L (ref 98–111)
Creatinine, Ser: 0.76 mg/dL (ref 0.44–1.00)
GFR, Estimated: >60 mL/min (ref >60)
Glucose, Bld: 300 mg/dL — ABNORMAL HIGH (ref 70–99)
Potassium: 3.6 mmol/L (ref 3.5–5.1)
Sodium: 135 mmol/L (ref 135–145)

## 2021-08-01 LAB — GLUCOSE, CAPILLARY: Glucose-Capillary: 240 mg/dL — ABNORMAL HIGH (ref 70–99)

## 2021-08-01 MED ORDER — INSULIN GLARGINE 100 UNIT/ML SOLOSTAR PEN
30.0000 [IU] | PEN_INJECTOR | Freq: Every day | SUBCUTANEOUS | 0 refills | Status: DC
  Filled 2021-08-01: qty 6, 20d supply, fill #0

## 2021-08-01 MED ORDER — METFORMIN HCL ER 500 MG PO TB24
500.0000 mg | ORAL_TABLET | Freq: Every day | ORAL | 11 refills | Status: DC
  Filled 2021-08-01: qty 30, 30d supply, fill #0

## 2021-08-01 MED ORDER — INSULIN GLARGINE-YFGN 100 UNIT/ML ~~LOC~~ SOLN
30.0000 [IU] | Freq: Every day | SUBCUTANEOUS | Status: DC
  Administered 2021-08-01: 30 [IU] via SUBCUTANEOUS
  Filled 2021-08-01: qty 0.3

## 2021-08-01 NOTE — Progress Notes (Signed)
Pt signed AMA paper and provided AVS and letter to work per Dr Dana Allan.  Pt is enforcing her condition will improve after she got home and refused to stay for further care. Pt left the room with all of her belongings accompanied with her mother. Medicines from transition of care provided and informed to follow up with [facility] as per AVS, Pt showed understanding to it.  Lonia Farber, RN

## 2021-08-01 NOTE — Progress Notes (Addendum)
Walking saturation in room air 87, but pt felt she is fine  Walking HR was 128  Pt is still adamant to leave, MD's aware

## 2021-08-01 NOTE — Discharge Summary (Signed)
[facility] Discharge Summary  Patient name: Annette [name] Medical record number: [id number] Date of birth: [birth year] Age: 30 y.o. Gender: female Date of Admission: 07/29/2021  Date of Discharge: 08/01/2021 Admitting Physician: Martyn Malay, MD  Primary Care Provider: Gifford Shave, MD Consultants: CCM  Indication for Hospitalization: Acute [name] and Left Leg DVT  Discharge Diagnoses/Problem List:  Acute submassive [name] with RV strain LLE DVT Uncontrolled Type 2 DM  Disposition: [place] AMA  Discharge Condition: [place] AMA.   Discharge Exam:  Physical Exam:  General: 30 y.o. female in NAD and appears anxious Cardio: Tachycardic, HR 106, regular rhythm, no m/r/g Lungs: CTAB, no wheezing, no rhonchi, no crackles, no IWOB on room air Abdomen: Soft, non-tender to palpation, non-distended, positive bowel sounds Skin: warm and dry Extremities: No lower extremity edema and distal pulses present bilaterally  Brief Hospital Course:  Annette [name] is a 30 year old female who presented to the ED on 10/28 with dyspnea, chest pain and left calf pain and was admitted for an acute DVT and [name]. PMH is signficant for prior DVT, [name], T2DM, and unintentional acetaminophen overdose. Brief Hospital course as outlined below.   Acute Pulmonary Embolism and Left LE DVT Annette [name] presented to the ED with worsening dyspnea, pressure-like chest pain and left calf pain for 2 weeks. Pt was tachycardic up to 120s with remainder of vitals WNL including SpO2 99% on RA. Initial labs of normal troponin, INR 1.0 and PT 13.4. EKG showed sinus tachycardia. CTA revealed bilateral pulmonary emboli with rt heart strain. LE doppler positive for left LE DVT of popliteal vein, posterior tibial veins and peroneal veins. PCCM was consulted and recommended continued systemic heparin. Echo was positive for McConnell's sign. Normal LVEF with reduced RV pressure at 11.1 mmHg.  Annette [name] symptoms were improving while inpatient and heparin was discontinued on 10/28 and she was started on oral DOAC (Xarelto 15 mg BID for 41 doses total). Early morning at 01:01 on 10/29 she desaturated on room air to 88% while asleep, this resolved with 2L [place]. During ambulatory test, Annette oxygen saturation was 87% on RA, tachycardic in 130s. On 2L [place] O2 sats increased to 92%.  No obvious risk factors for [name] - no birth control, recent travel, recent surgery, or immobilization. No family hx of blood clots. Annette [name] was provoked in the setting of Covid-19 and she self discontinued Eliquis several months ago.    Uncontrolled type 2 diabetes On admission, Annette [name] glucose was 421, and last A1c on 03/11/21 was 14.3. A1c performed 10/27 was 13.3. Currently not on any home medications, home glucose readings typically in 300s. Pt has received semglee while inpatient as well as aspart for the sensitive sliding scale. On a carb modified diet while in the hospital. She met with diabetes educator on 10/28. Discharged meds Glargine 30 units daily.  Metformin XR 500 mg daily  Follow up: Family medicine: further diabetes education and monitoring. May need to increase Semglee dosage depending on glucose readings at home.  Start Xarelto 20 mg daily on 08/20/21 Hematology for further workup Please schedule appointment with Hortencia Pilar at [facility] for financial assistance with Xarelto and Glargine    Significant Procedures: ECHO  Significant Labs and Imaging:  Recent Labs  Lab 07/29/21 1029 07/30/21 0539 07/31/21 0600  WBC 5.9 6.6 5.8  HGB 15.6* 12.3 12.6  HCT 47.3* 36.7 38.4  PLT 242 242 283   Recent Labs  Lab  07/29/21 1029 07/30/21 0857 07/30/21 1216 07/31/21 0600 08/01/21 0121  NA 133* 137 131* 137 135  K 3.9 4.2 4.1 3.9 3.6  CL 102 108 105 108 107  CO2 18* 17* 19* 21* 20*  GLUCOSE 421* 286* 384* 296* 300*  BUN 9 9 10 10 9   CREATININE 0.76 0.69 0.81 0.75 0.76  CALCIUM 9.0  8.5* 8.0* 8.4* 8.2*    Results/Tests Pending at Time of Discharge:   Discharge Medications:  Allergies as of 08/01/2021   No Known Allergies      Medication List     STOP taking these medications    doxycycline 100 MG capsule Commonly known as: VIBRAMYCIN   guaiFENesin 100 MG/5ML liquid Commonly known as: ROBITUSSIN       TAKE these medications    Accu-Chek Guide test strip Generic drug: glucose blood Use to test blood sugars up to 4 times daily as directed.   Accu-Chek Guide w/Device Kit Use to test blood sugars up to 4 times daily as directed.   Accu-Chek Softclix Lancets lancets Use as directed.   diphenhydramine-acetaminophen 25-500 MG Tabs tablet Commonly known as: TYLENOL PM Take 2 tablets by mouth at bedtime as needed (headache/pain/sleep/fever).   insulin glargine 100 UNIT/ML Solostar Pen Commonly known as: LANTUS Inject 30 Units into the skin daily.   metFORMIN 500 MG 24 hr tablet Commonly known as: Glucophage XR Take 1 tablet (500 mg total) by mouth daily with breakfast.   Pentips 32G X 4 MM Misc Generic drug: Insulin Pen Needle Use to inject insulin as directed up to 4 times daily.   Xarelto Starter Pack Generic drug: Rivaroxaban Stater Pack (15 mg and 20 mg) Follow package directions: Take one 75m tablet by mouth twice a day. On day 22, switch to one 26mtablet once a day. Take with food.        Discharge Instructions: Please refer to Patient Instructions section of EMR for full details.  Patient was counseled important signs and symptoms that should prompt return to medical care, changes in medications, dietary instructions, activity restrictions, and follow up appointments.   Follow-Up Appointments:  Follow-up Information     [facility] on 08/04/2021.   Why: Your appointment is at 11:10 am, please arrive at least 15 minutes earlier than your scheduled appointment time. Contact information: [address]              WaCarollee LeitzMD 08/01/2021, 10:29 AM PGY-3, [facility]

## 2021-08-01 NOTE — Plan of Care (Signed)
  Problem: Education: Goal: Knowledge of General Education information will improve Description: Including pain rating scale, medication(s)/side effects and non-pharmacologic comfort measures Outcome: Completed/Met   Problem: Health Behavior/Discharge Planning: Goal: Ability to manage health-related needs will improve Outcome: Completed/Met   Problem: Clinical Measurements: Goal: Ability to maintain clinical measurements within normal limits will improve Outcome: Completed/Met Goal: Will remain free from infection Outcome: Completed/Met Goal: Diagnostic test results will improve Outcome: Completed/Met Goal: Respiratory complications will improve Outcome: Completed/Met Goal: Cardiovascular complication will be avoided Outcome: Completed/Met   Problem: Activity: Goal: Risk for activity intolerance will decrease Outcome: Completed/Met   Problem: Nutrition: Goal: Adequate nutrition will be maintained Outcome: Completed/Met   Problem: Coping: Goal: Level of anxiety will decrease Outcome: Completed/Met   Problem: Elimination: Goal: Will not experience complications related to bowel motility Outcome: Completed/Met Goal: Will not experience complications related to urinary retention Outcome: Completed/Met   Problem: Pain Managment: Goal: General experience of comfort will improve Outcome: Completed/Met   Problem: Safety: Goal: Ability to remain free from injury will improve Outcome: Completed/Met   Problem: Skin Integrity: Goal: Risk for impaired skin integrity will decrease Outcome: Completed/Met   Problem: Acute Rehab PT Goals(only PT should resolve) Goal: Pt Will Ambulate Outcome: Completed/Met Goal: Pt Will Go Up/Down Stairs Outcome: Completed/Met   Problem: Education: Goal: Ability to describe self-care measures that may prevent or decrease complications (Diabetes Survival Skills Education) will improve Outcome: Completed/Met Goal: Individualized Educational  Video(s) Outcome: Completed/Met   Problem: Nutritional: Goal: Maintenance of adequate nutrition will improve Outcome: Completed/Met Goal: Progress toward achieving an optimal weight will improve Outcome: Completed/Met

## 2021-08-01 NOTE — Progress Notes (Signed)
Family Medicine Teaching Service Daily Progress Note Intern Pager: [phone]  Patient name: Annette [name] Medical record number: [id number] Date of birth: 1991/09/06 Age: 30 y.o. Gender: female  Primary Care Provider: Derrel Nip, MD Consultants: Pumonology Code Status: Full  Pt Overview and Major Events to Date:  10/27: Admitted FPTS  Assessment and Plan: Annette [name] is a 30 year old female who presented with dyspnea, chest pain, left calf pain and found to have acute DVT and PE.  Past medical history significant for prior DVT, PE, type 2 diabetes and unintentional acetaminophen overdose   Acute PE with RV strain Stable. VSS overnight. Remains tachycardic with HR 105-116.  O2 sats 92-95% on room air.  Desat to 87% with ambulation 10/29 abd required 2L  oxygen 2L.  Transitioned to Xarelto and tolerating well.  On exam heart rate 106 and regular.  Resps 18/min and on room air.  -Continue Xarelto 15 mg BID (10/28-11/17) then transition to 20 mg daily.  -Will repeat ECHO if continues to have desaturations -Will need Heme referral outpatient  -Monitor for bleeding -Monitor hemodynamic stability  Left lower extremity DVT Stable. New acute DVT on admission.  Likely source of PE.  Has history of right proximal popliteal DVT.  On exam nocalf pain or significant lower extremity edema.  Distal pulses present bilaterally.  -Continue Xarelto treatment dosing -Continue to monitor -Will need financial assistance program for continued Xarelto, to be set up by PCP outpatient.    Uncontrolled DM Type 2 Chronic.  Stable. CGG overnight 240-284.  Received 21 u Aspart past 24 hrs with 20 u Glargine.   -Increase Glargine 30 units daily -Continue mSSI with CBG monitoring  -Add Metformin XR 500 mg daily outpatient -Consider close diabetic follow up with [facility] pharmacy team outpatient.   -Will need financial assistance program for medications  FEN/GI: Carb Modified PPx: Xarelto Dispo:Home  tomorrow. Barriers include none.   Subjective:  No acute events overnight.  Denies any shortness of breath, chest pain or lower extremity pain. Reports no oxygen requirement overnight. Endorses that she is very anxious and wants to go home. She reports that she would feel better if she were home.  Not eating much.  Voiding well and has moved bowels.    Objective: Temp:  [97.9 F (36.6 C)-98.5 F (36.9 C)] 98 F (36.7 C) (10/30 0431) Pulse Rate:  [106-120] 107 (10/30 0431) Resp:  [16-20] 20 (10/30 0431) BP: (105-118)/(78-90) 114/86 (10/30 0431) SpO2:  [91 %-95 %] 95 % (10/30 0431)  Physical Exam:  General: 29 y.o. female in NAD and appears anxious Cardio: Tachycardic, HR 106, regular rhythm, no m/r/g Lungs: CTAB, no wheezing, no rhonchi, no crackles, no IWOB on room air Abdomen: Soft, non-tender to palpation, non-distended, positive bowel sounds Skin: warm and dry Extremities: No lower extremity edema and distal pulses present bilaterally  Laboratory: Recent Labs  Lab 07/29/21 1029 07/30/21 0539 07/31/21 0600  WBC 5.9 6.6 5.8  HGB 15.6* 12.3 12.6  HCT 47.3* 36.7 38.4  PLT 242 242 283   Recent Labs  Lab 07/30/21 1216 07/31/21 0600 08/01/21 0121  NA 131* 137 135  K 4.1 3.9 3.6  CL 105 108 107  CO2 19* 21* 20*  BUN 10 10 9   CREATININE 0.81 0.75 0.76  CALCIUM 8.0* 8.4* 8.2*  GLUCOSE 384* 296* 300*    Imaging/Diagnostic Tests: No results found.   , MD 08/01/2021, 6:10 AM PGY-3, [facility] Family Medicine FPTS Intern pager: [phone], text pages welcome

## 2021-08-01 NOTE — Progress Notes (Signed)
Inpatient Diabetes Program Recommendations  AACE/ADA: New Consensus Statement on Inpatient Glycemic Control   Target Ranges:  Prepandial:   less than 140 mg/dL      Peak postprandial:   less than 180 mg/dL (1-2 hours)      Critically ill patients:  140 - 180 mg/dL   Results for Annette [name], Annette [name] (MRN [id number]) as of 08/01/2021 08:30  Ref. Range 07/31/2021 07:03 07/31/2021 11:31 07/31/2021 15:28 07/31/2021 21:19 08/01/2021 06:15  Glucose-Capillary Latest Ref Range: 70 - 99 mg/dL 747 (H) 340 (H) 370 (H) 284 (H) 240 (H)    Review of Glycemic Control  Current orders for Inpatient glycemic control: Semglee 30 units daily, Novolog 0-15 units TID with meals   Inpatient Diabetes Program Recommendations:     Insulin: Noted Semglee increased from 20 to 30 units daily. Please consider adding Novolog 0-5 units QHS for bedtime correction. If post prandial glucose remains consistently over 180 mg/dl, please consider ordering Novolog 6 units TID with meals for meal coverage if patient eats at least 50% of meals.  Thanks, Orlando Penner, RN, MSN, CDE Diabetes Coordinator Inpatient Diabetes Program [phone] (Team Pager from 8am to 5pm)

## 2021-08-02 ENCOUNTER — Other Ambulatory Visit (HOSPITAL_COMMUNITY): Payer: Self-pay

## 2021-08-04 ENCOUNTER — Other Ambulatory Visit: Payer: Self-pay

## 2021-08-04 ENCOUNTER — Ambulatory Visit (INDEPENDENT_AMBULATORY_CARE_PROVIDER_SITE_OTHER): Payer: Self-pay | Admitting: Family Medicine

## 2021-08-04 VITALS — BP 130/91 | HR 102 | Ht 66.0 in | Wt 227.5 lb

## 2021-08-04 DIAGNOSIS — I2699 Other pulmonary embolism without acute cor pulmonale: Secondary | ICD-10-CM

## 2021-08-04 DIAGNOSIS — E119 Type 2 diabetes mellitus without complications: Secondary | ICD-10-CM

## 2021-08-04 DIAGNOSIS — I2609 Other pulmonary embolism with acute cor pulmonale: Secondary | ICD-10-CM

## 2021-08-04 MED ORDER — METFORMIN HCL ER 500 MG PO TB24: 500.0000 mg | ORAL_TABLET | Freq: Every day | ORAL | 11 refills | Status: AC

## 2021-08-04 NOTE — Patient Instructions (Addendum)
Thank you for coming to see me today. It was a pleasure. Today we talked about:   I'm glad you are feeling better  Will send referral to Hematology, they will call you with an appointment We will get some labs today.  If they are abnormal or we need to do something about them, I will call you.  If they are normal, I will send you a message on MyChart (if it is active) or a letter in the mail.  If you don't hear from [place] in 2 weeks, please call the office at the number below.   Start Metformin 500 mg with breakfast Take your next dose of Lantus 15 units now then starting tomorrow take Lantus 30 units once a day  Follow up with Pharmacy as scheduled  Please follow-up with PCP in 1-2 weeks  If you have any questions or concerns, please do not hesitate to call the office at [phone].  Best,   Dana Allan, MD

## 2021-08-04 NOTE — Progress Notes (Signed)
    SUBJECTIVE:   CHIEF COMPLAINT / HPI:   Patient admitted to hospital between 10/27 and 10/30 and treated for Acute submassive  PE withR RV strain. Left hospital  AMA on 10/30. Presents today for follow up hospital admission. Reports improvement in shortness of breath.  Able to walk up stairs.  Denies any chest pain but still has elevated heart rate. Requires repeat CBC today.    CBG at home 245 this am s/p meal.Taking Lantus 15 unit BID. Had not started Metformin as reports did not know was ordered.     PERTINENT  PMH / PSH:  Acute  Submassive  PE with RV Strain Bilateral DVT Uncontrolled Type 2 DM  OBJECTIVE:   BP (!) 130/91   Pulse (!) 102   Ht 5\' 6"  (1.676 m)   Wt 227 lb 8 oz (103.2 kg)   LMP 07/27/2021   SpO2 100%   BMI 36.72 kg/m    General: Alert, no acute distress Cardio: Normal S1 and S2, RRR, no r/m/g Pulm: CTAB, normal work of breathing Abdomen: Bowel sounds normal. Abdomen soft and non-tender.  Extremities: No peripheral edema.   ASSESSMENT/PLAN:   Type 2 diabetes mellitus (HCC) Uncontrolled DM -Start Metformin XR 500 mg daily -Take additional 15units Lantus now then switch Lantus to 30 units daily starting  Tomorrow -Connected with Pharm for financial assistance and diabetic management -Would consider GLP1 in future -Follow up with PCP in 1-2 weeks -Strict return precautions provided  Pulmonary embolism (HCC) Improving shortness of breath, no hypoxia and without chest pain but remains mildly tachycardic.  Compliant with Xarelto.  No bleeding. -Connected with Pharm for financial assistance to continue Xarelto, will likely need lifelong therapy -CBC today -Heme/Onc referral for unprovoked PE/DVT and second occurrence -Follow up with PCP in 1-2 weeks -Strict return precautions provided    07/29/2021, MD [facility]

## 2021-08-08 ENCOUNTER — Encounter: Payer: Self-pay | Admitting: Family Medicine

## 2021-08-08 NOTE — Assessment & Plan Note (Signed)
Improving shortness of breath, no hypoxia and without chest pain but remains mildly tachycardic.  Compliant with Xarelto.  No bleeding. -Connected with Pharm for financial assistance to continue Xarelto, will likely need lifelong therapy -CBC today -Heme/Onc referral for unprovoked PE/DVT and second occurrence -Follow up with PCP in 1-2 weeks -Strict return precautions provided

## 2021-08-08 NOTE — Assessment & Plan Note (Signed)
Uncontrolled DM -Start Metformin XR 500 mg daily -Take additional 15units Lantus now then switch Lantus to 30 units daily starting  Tomorrow -Connected with Pharm for financial assistance and diabetic management -Would consider GLP1 in future -Follow up with PCP in 1-2 weeks -Strict return precautions provided

## 2021-08-10 ENCOUNTER — Telehealth: Payer: Self-pay | Admitting: Physician Assistant

## 2021-08-10 NOTE — Telephone Encounter (Signed)
Scheduled appt per 1/16 referral. Pt is aware of appt date and time.  °

## 2021-08-12 ENCOUNTER — Other Ambulatory Visit (HOSPITAL_COMMUNITY): Payer: Self-pay

## 2021-08-12 ENCOUNTER — Telehealth (HOSPITAL_COMMUNITY): Payer: Self-pay

## 2021-08-12 NOTE — Telephone Encounter (Signed)
Pharmacy Transitions of Care Follow-up Telephone Call  Date of discharge: 08/01/21  Discharge Diagnosis: PE/DVT  How have you been since you were released from the hospital?  Patient doing well, no questions at this time.  Medication changes made at discharge:     START taking: Accu-Chek Guide (glucose blood)  Accu-Chek Guide  Accu-Chek Softclix Lancets  insulin glargine (LANTUS)  Pentips (Insulin Pen Needle)  Xarelto Starter Pack (Rivaroxaban Stater Pack (15 mg and 20 mg))  STOP taking: doxycycline 100 MG capsule (VIBRAMYCIN)  guaiFENesin 100 MG/5ML liquid (ROBITUSSIN)   Medication changes verified by the patient? Yes    Medication Accessibility:  Home Pharmacy: Not discussed   Was the patient provided with refills on discharged medications? No   Have all prescriptions been transferred from [facility] to home pharmacy? N/A   Is the patient able to afford medications? Patient discharged on [facility], will need assistance. Patient has discussed possibility of samples with doctor's office and need for assistance.    Medication Review:  RIVAROXABAN (XARELTO)  Rivaroxaban 15 mg BID initiated on 07/30/21. Will switch to 20 mg daily after 21 days (DATE 08/20/21).   - Discussed importance of taking medication with food and around the same time everyday  - Advised patient of medications to avoid (NSAIDs, ASA)  - Educated that Tylenol (acetaminophen) will be the preferred analgesic to prevent risk of bleeding  - Emphasized importance of monitoring for signs and symptoms of bleeding (abnormal bruising, prolonged bleeding, nose bleeds, bleeding from gums, discolored urine, black tarry stools)  - Advised patient to alert all providers of anticoagulation therapy prior to starting a new medication or having a procedure  Follow-up Appointments:  PCP Hospital f/u appt confirmed? Scheduled to see Dr. Nicholaus Bloom on 08/18/21 @ 1:30pm.   Specialist Hospital f/u appt confirmed? Scheduled to see Dr. Mariane Baumgarten  on 08/27/21 @ 9:00AM.   If their condition worsens, is the pt aware to call PCP or go to the Emergency Dept.? Yes  Final Patient Assessment: Patient has follow up scheduled and knows to get refills at follow up

## 2021-08-16 ENCOUNTER — Inpatient Hospital Stay: Payer: Medicaid Other | Admitting: Internal Medicine

## 2021-08-18 ENCOUNTER — Ambulatory Visit: Payer: Medicaid Other | Admitting: Pharmacist

## 2021-08-18 NOTE — Progress Notes (Incomplete)
Subjective:    Patient ID: Annette [name], female    DOB: [birth year], 30 y.o.   MRN: [id number]  HPI Patient is a 30 y.o. female who presents for diabetes management. She is in good spirits and presents {w-w/o:315700} assistance. Patient was referred and last seen by provider, Dr. Clent Ridges, on 08/04/21  PMH significant for ***.   Patient reports diabetes was diagnosed in ***.   Insurance coverage/medication affordability: Medicaid  Family/Social history: ***  Current diabetes medications include: *** Current hypertension medications include: *** Current hyperlipidemia medications include: *** Patient states that {He/she (caps):30048} {Is/is not:9024} taking {his/her/their:21314} medications as prescribed. Patient {Actions; denies-reports:120008} adherence with medications. Patient states that {He/she (caps):30048} misses {his/her/their:21314} medications *** times per week, on average.  Do you feel that your medications are working for you?  {YES NO:22349}  Have you been experiencing any side effects to the medications prescribed? {YES NO:22349}  Do you have any problems obtaining medications due to transportation or finances?  {YES [id number]     Patient reported dietary habits:  Eats *** meals/day and *** snacks/day; Boluses with *** meals/day and *** snacks/day Breakfast:*** Lunch:*** Dinner:*** Snacks:*** Drinks:***  Patient-reported exercise habits: ***   Patient {Actions; denies-reports:120008} hypoglycemic events. Patient {Actions; denies-reports:120008} polyuria (increased urination).  Patient {Actions; denies-reports:120008} polyphagia (increased appetite).  Patient {Actions; denies-reports:120008} polydipsia (increased thirst).  Patient {Actions; denies-reports:120008} neuropathy (nerve pain). Patient {Actions; denies-reports:120008} visual changes. Patient {Actions; denies-reports:120008} self foot exams.   Home fasting blood sugars: ***  2 hour post-meal/random  blood sugars: ***  Objective:   Labs:   Physical Exam  ROS  Lab Results  Component Value Date   HGBA1C 13.3 (H) 07/29/2021   HGBA1C 14.3 (H) 03/11/2021   HGBA1C 12.9 (H) 09/17/2019    There were no vitals filed for this visit.  No results found for: MICRALBCREAT  Lipid Panel  No results found for: CHOL, TRIG, HDL, CHOLHDL, VLDL, LDLCALC, LDLDIRECT  Clinical Atherosclerotic Cardiovascular Disease (ASCVD): {YES/NO:21197} The ASCVD Risk score (Arnett DK, et al., 2019) failed to calculate for the following reasons:   The 2019 ASCVD risk score is only valid for ages 26 to 4   PHQ-9 Score: ***  Assessment/Plan:   ***T1/T2DM {ACTION; IS/IS WUJ:81191478} controlled likely due to ***. Medication adherence appears ***. Additional pharmacotherapy {ACTION; IS/IS [id number]}. Patient with a history of intolerance to ***. Will initiate ***. Benefits of medication include ***. Patient educated on purpose, proper use and potential adverse effects of ***.  Following instruction patient verbalized understanding of treatment plan.    {Meds adjust:18428} basal insulin *** (insulin ***). Patient will continue to titrate 1 unit every *** days if fasting blood sugar > 100mg /dl until fasting blood sugars reach goal or next visit. {Meds adjust:18428}  rapid insulin *** (insulin ***) to ***.  {Meds adjust:18428} GLP-1 *** (generic name***) to ***.  {Meds adjust:18428} SGLT2-I *** (generic name***) to ***. Counseled on sick day rules for ***. Extensively discussed pathophysiology of diabetes, dietary effects on blood sugar control, and recommended lifestyle interventions,  Patient will adhere to dietary modifications Patient will exercise *** with goal to increase towards target of at least 150 minutes of moderate intensity exercise weekly Counseled on s/sx of and management of hypoglycemia Next A1C anticipated ***.   ASCVD risk - primary***secondary prevention in patient with diabetes. Last LDL  {Is/is not:9024} controlled. ASCVD risk score {Is/is not:9024} >20%  - {Desc; low/moderate/high:110033} intensity statin indicated. Aspirin {Is/is not:9024} indicated.   {Meds adjust:18428} aspirin *** mg  {Meds  adjust:18428} ***statin *** mg.   Hypertension longstanding*** currently ***.  Blood pressure goal = *** mmHg. Medication adherence ***.  Blood pressure control is suboptimal due to ***.  *** Extensively discussed pathophysiology of blood pressure, dietary effects on blood pressure control, and recommended lifestyle interventions  Follow-up appointment *** to review sugar readings. Written patient instructions provided.  This appointment required *** minutes of patient care (this includes precharting, chart review, review of results, and face-to-face care).  Thank you for involving pharmacy to assist in providing this patient's care.  Patient seen with ***

## 2021-08-27 ENCOUNTER — Inpatient Hospital Stay: Payer: Medicaid Other

## 2021-08-27 ENCOUNTER — Encounter: Payer: Self-pay | Admitting: Physician Assistant

## 2021-08-27 ENCOUNTER — Inpatient Hospital Stay: Payer: Medicaid Other | Admitting: Physician Assistant

## 2021-08-27 ENCOUNTER — Other Ambulatory Visit: Payer: Self-pay

## 2021-08-27 ENCOUNTER — Inpatient Hospital Stay: Payer: Self-pay

## 2021-08-27 ENCOUNTER — Inpatient Hospital Stay: Payer: Self-pay | Attending: Physician Assistant | Admitting: Physician Assistant

## 2021-08-27 VITALS — BP 138/89 | HR 99 | Temp 98.4°F | Resp 18 | Wt 219.1 lb

## 2021-08-27 DIAGNOSIS — I2699 Other pulmonary embolism without acute cor pulmonale: Secondary | ICD-10-CM

## 2021-08-27 DIAGNOSIS — E119 Type 2 diabetes mellitus without complications: Secondary | ICD-10-CM | POA: Insufficient documentation

## 2021-08-27 DIAGNOSIS — Z86718 Personal history of other venous thrombosis and embolism: Secondary | ICD-10-CM | POA: Insufficient documentation

## 2021-08-27 DIAGNOSIS — I82432 Acute embolism and thrombosis of left popliteal vein: Secondary | ICD-10-CM | POA: Insufficient documentation

## 2021-08-27 DIAGNOSIS — Z7901 Long term (current) use of anticoagulants: Secondary | ICD-10-CM | POA: Insufficient documentation

## 2021-08-27 NOTE — Progress Notes (Signed)
[place] [phone]   Fax:([phone] [place] NOTE  Patient Care Team: Gifford Shave, MD as PCP - General (Family Medicine)  Hematological/Oncological History 1) 09/16/2019-09/17/2019: Admitted for shortness of breath and right calf pain and edema. Ultrasound showed acute DVT involving the right femoral vein, right popliteal vein, right posterior tibial vein, right peroneal vein, right gastrocnemius vein.  No DVT in the left lower extremity.  CTA chest showed  acute PE involving the right main pulmonary artery with extension into the lobar branches of the right upper and right lower lobes. Additionally, there was evidence of COVID positive bilateral pneumonia. Treated with IV heparin and transitioned to PO eliquis. This was considered a provoked DVT secondary to COVID. Patient only took Eliquis for two months and self discontinued.   2) 1027/2022-08/01/2021: Admitted for worsening dyspnea, pressure like chest pain and left calf pain x 2 weeks. CTA revealed bilateral pulmonary emboli with rt heart strain. LE doppler positive for left LE DVT of popliteal vein, posterior tibial veins and peroneal veins. Treated with IV heparin and then transitioned to Xarelto.   3) 08/27/2021: Establish care at [facility] Hematology/Oncology Dept.   CHIEF COMPLAINTS/PURPOSE OF CONSULTATION:  "Recurrent DVT and PE "  HISTORY OF PRESENTING ILLNESS:  Vedika Dumlao 30 y.o. female with medical history significant for type 2 diabetes, right leg DVT and pulmonary embolism. She is unaccompanied for this visit.   On exam today, Ms. Boeder reports that she is tolerating Xarelto without any major side effects. She denies easy bruising or signs of bleeding. Her energy levels are back to baseline and she is able to complete her daily ADLs on her own. She denies any changes to her appetite. She denies nausea, vomiting or abdominal pain. Her bowel habits are unchanged without  diarrhea or constipation. She denies fevers, chills, night sweats, shortness of breath, chest pain, lower extremity edema, cough or rash. She has no other complaints. Rest of the 10 point ROS is below.   MEDICAL HISTORY:  Past Medical History:  Diagnosis Date   Ankle fracture    Diabetes mellitus without complication ([place])     SURGICAL HISTORY: History reviewed. No pertinent surgical history.  SOCIAL HISTORY: Social History   Socioeconomic History   Marital status: Single    Spouse name: Not on file   Number of children: Not on file   Years of education: Not on file   Highest education level: Not on file  Occupational History   Not on file  Tobacco Use   Smoking status: Never   Smokeless tobacco: Never  Substance and Sexual Activity   Alcohol use: Not Currently   Drug use: Yes    Types: Marijuana   Sexual activity: Not on file  Other Topics Concern   Not on file  Social History Narrative   Not on file   Social Determinants of Health   Financial Resource Strain: Not on file  Food Insecurity: Not on file  Transportation Needs: Not on file  Physical Activity: Not on file  Stress: Not on file  Social Connections: Not on file  Intimate Partner Violence: Not on file    FAMILY HISTORY: Family History  Problem Relation Age of Onset   Diabetes Mother    Stroke Maternal Grandmother    Diabetes Other     ALLERGIES:  has No Known Allergies.  MEDICATIONS:  Current Outpatient Medications  Medication Sig Dispense Refill   Accu-Chek Softclix Lancets lancets Use as directed. 100  each 0   Blood Glucose Monitoring Suppl (BLOOD GLUCOSE MONITOR SYSTEM) w/Device KIT Use to test blood sugars up to 4 times daily as directed. 1 kit 0   diphenhydramine-acetaminophen (TYLENOL PM) 25-500 MG TABS tablet Take 2 tablets by mouth at bedtime as needed (headache/pain/sleep/fever).     glucose blood test strip Use to test blood sugars up to 4 times daily as directed. 100 each 0   insulin  glargine (LANTUS) 100 UNIT/ML Solostar Pen Inject 30 Units into the skin daily. 6 mL 0   Insulin Pen Needle 32G X 4 MM MISC Use to inject insulin as directed up to 4 times daily. 100 each 0   metFORMIN (GLUCOPHAGE XR) 500 MG 24 hr tablet Take 1 tablet (500 mg total) by mouth daily with breakfast. 30 tablet 11   RIVAROXABAN (XARELTO) VTE STARTER PACK (15 & 20 MG) Follow package directions: Take one 36m tablet by mouth twice a day. On day 22, switch to one 273mtablet once a day. Take with food. 51 each 0   No current facility-administered medications for this visit.    REVIEW OF SYSTEMS:   Constitutional: ( - ) fevers, ( - )  chills , ( - ) night sweats Eyes: ( - ) blurriness of vision, ( - ) double vision, ( - ) watery eyes Ears, nose, mouth, throat, and face: ( - ) mucositis, ( - ) sore throat Respiratory: ( - ) cough, ( - ) dyspnea, ( - ) wheezes Cardiovascular: ( - ) palpitation, ( - ) chest discomfort, ( - ) lower extremity swelling Gastrointestinal:  ( - ) nausea, ( - ) heartburn, ( - ) change in bowel habits Skin: ( - ) abnormal skin rashes Lymphatics: ( - ) new lymphadenopathy, ( - ) easy bruising Neurological: ( - ) numbness, ( - ) tingling, ( - ) new weaknesses Behavioral/Psych: ( - ) mood change, ( - ) new changes  All other systems were reviewed with the patient and are negative.  PHYSICAL EXAMINATION: ECOG PERFORMANCE STATUS: 0 - Asymptomatic  Vitals:   08/27/21 1323  BP: 138/89  Pulse: 99  Resp: 18  Temp: 98.4 F (36.9 C)  SpO2: 98%   Filed Weights   08/27/21 1323  Weight: 219 lb 1.6 oz (99.4 kg)    GENERAL: well appearing female in NAD  SKIN: skin color, texture, turgor are normal, no rashes or significant lesions EYES: conjunctiva are pink and non-injected, sclera clear OROPHARYNX: no exudate, no erythema; lips, buccal mucosa, and tongue normal  NECK: supple, non-tender LYMPH:  no palpable lymphadenopathy in the cervical or supraclavicular lymph nodes.   LUNGS: clear to auscultation and percussion with normal breathing effort HEART: regular rate & rhythm and no murmurs and no lower extremity edema ABDOMEN: soft, non-tender, non-distended, normal bowel sounds Musculoskeletal: no cyanosis of digits and no clubbing  PSYCH: alert & oriented x 3, fluent speech NEURO: no focal motor/sensory deficits  LABORATORY DATA:  I have reviewed the data as listed CBC Latest Ref Rng & Units 07/31/2021 07/30/2021 07/29/2021  WBC 4.0 - 10.5 K/uL 5.8 6.6 5.9  Hemoglobin 12.0 - 15.0 g/dL 12.6 12.3 15.6(H)  Hematocrit 36.0 - 46.0 % 38.4 36.7 47.3(H)  Platelets 150 - 400 K/uL 283 242 242    CMP Latest Ref Rng & Units 08/01/2021 07/31/2021 07/30/2021  Glucose 70 - 99 mg/dL 300(H) 296(H) 384(H)  BUN 6 - 20 mg/dL _0 Creatinine 0.44 - 1.00 mg/dL 0.76 0.75 0.81  Sodium 135 - 145 mmol/L 135 137 131(L)  Potassium 3.5 - 5.1 mmol/L 3.6 3.9 4.1  Chloride 98 - 111 mmol/L 107 108 105  CO2 22 - 32 mmol/L 20(L) 21(L) 19(L)  Calcium 8.9 - 10.3 mg/dL 8.2(L) 8.4(L) 8.0(L)  Total Protein 6.5 - 8.1 g/dL - - -  Total Bilirubin 0.3 - 1.2 mg/dL - - -  Alkaline Phos 38 - 126 U/L - - -  AST 15 - 41 U/L - - -  ALT 0 - 44 U/L - - -    RADIOGRAPHIC STUDIES: I have personally reviewed the radiological images as listed and agreed with the findings in the report. DG Chest 2 View  Result Date: 07/29/2021 CLINICAL DATA:  Chest pain. EXAM: CHEST - 2 VIEW COMPARISON:  July 23, 2021. FINDINGS: The heart size and mediastinal contours are within normal limits. Both lungs are clear. The visualized skeletal structures are unremarkable. IMPRESSION: No active cardiopulmonary disease. Electronically Signed   By: Marijo Conception M.D.   On: 07/29/2021 10:42   CT Angio Chest PE W and/or Wo Contrast  Result Date: 07/29/2021 CLINICAL DATA:  Chest pain and difficulty breathing EXAM: CT ANGIOGRAPHY CHEST WITH CONTRAST TECHNIQUE: Multidetector CT imaging of the chest was performed using  the standard protocol during bolus administration of intravenous contrast. Multiplanar CT image reconstructions and MIPs were obtained to evaluate the vascular anatomy. CONTRAST:  55m OMNIPAQUE IOHEXOL 350 MG/ML SOLN COMPARISON:  CT chest 09/16/2019 FINDINGS: Cardiovascular: Bilateral pulmonary emboli identified including emboli in the right and left main pulmonary arteries common bifurcations and within the bilateral upper and lower lobar pulmonary arteries. No saddle embolism visualized. Main pulmonary artery is normal caliber. There is evidence of right heart strain with RV to LV ratio greater than 1. Note is made of previous pulmonary emboli in similar location, although more extensive on the current study. Heart size is normal. No pericardial effusion identified. Thoracic aorta is normal in course and caliber. Mediastinum/Nodes: No enlarged mediastinal, hilar, or axillary lymph nodes. Thyroid gland, trachea, and esophagus demonstrate no significant findings. Lungs/Pleura: Patchy ground-glass airspace densities identified in the left lung, mostly upper lobe. No pleural effusion or pneumothorax. Upper Abdomen: No acute process identified in the visualized upper abdomen. Musculoskeletal: No suspicious bony lesions identified. Review of the MIP images confirms the above findings. IMPRESSION: 1. Positive for acute PE with CT evidence of right heart strain (RV/LV Ratio = 1.17) consistent with at least submassive (intermediate risk) PE. The presence of right heart strain has been associated with an increased risk of morbidity and mortality. Please refer to the "PE Focused" order set in EPIC. 2. Mild patchy ground-glass infiltrates in the left lung suggesting pneumonia. Discussed with Dr. RJeanell Sparrowover the telephone at 2:35 p.m. on 07/29/2021 with read back. Electronically Signed   By: DOfilia NeasM.D.   On: 07/29/2021 14:39   ECHOCARDIOGRAM COMPLETE  Result Date: 07/30/2021    ECHOCARDIOGRAM REPORT   Patient  Name:   SSABRIE MORITZDate of Exam: 07/30/2021 Medical Rec #:  [id number]     Height:       66.0 in Accession #:    [id number]    Weight:       211.6 lb Date of Birth:  303-05-1991      BSA:          2.048 m Patient Age:    30 years       BP:  120/81 mmHg Patient Gender: F              HR:           114 bpm. Exam Location:  Inpatient Procedure: 2D Echo, Cardiac Doppler and Color Doppler Indications:    Pulmonary Embolus I26.09  History:        Patient has no prior history of Echocardiogram examinations.                 Risk Factors:Diabetes.  Sonographer:    Bernadene Person RDCS Referring Phys: [place] sign - c/w pulmonary embolus.  2. Left ventricular ejection fraction, by estimation, is 60 to 65%. The left ventricle has normal function. The left ventricle has no regional wall motion abnormalities. Left ventricular diastolic parameters are consistent with Grade I diastolic dysfunction (impaired relaxation). There is the interventricular septum is flattened in diastole ('D' shaped left ventricle), consistent with right ventricular volume overload.  3. Right ventricular systolic function is severely reduced. The right ventricular size is moderately enlarged. There is normal pulmonary artery systolic pressure.  4. The mitral valve is normal in structure. Trivial mitral valve regurgitation.  5. The aortic valve is normal in structure. Aortic valve regurgitation is not visualized. No aortic stenosis is present. FINDINGS  Left Ventricle: Left ventricular ejection fraction, by estimation, is 60 to 65%. The left ventricle has normal function. The left ventricle has no regional wall motion abnormalities. The left ventricular internal cavity size was normal in size. There is  no left ventricular hypertrophy. The interventricular septum is flattened in diastole ('D' shaped left ventricle), consistent with right ventricular volume overload. Left ventricular diastolic parameters  are consistent with Grade I diastolic dysfunction  (impaired relaxation). Right Ventricle: The right ventricular size is moderately enlarged. Right vetricular wall thickness was not well visualized. Right ventricular systolic function is severely reduced. There is normal pulmonary artery systolic pressure. The tricuspid regurgitant velocity is 1.42 m/s, and with an assumed right atrial pressure of 3 mmHg, the estimated right ventricular systolic pressure is 18.5 mmHg. Left Atrium: Left atrial size was normal in size. Right Atrium: Right atrial size was normal in size. Pericardium: There is no evidence of pericardial effusion. Mitral Valve: The mitral valve is normal in structure. Trivial mitral valve regurgitation. Tricuspid Valve: The tricuspid valve is normal in structure. Tricuspid valve regurgitation is trivial. Aortic Valve: The aortic valve is normal in structure. Aortic valve regurgitation is not visualized. No aortic stenosis is present. Pulmonic Valve: The pulmonic valve was normal in structure. Pulmonic valve regurgitation is not visualized. Aorta: The aortic root and ascending aorta are structurally normal, with no evidence of dilitation. IAS/Shunts: The interatrial septum was not well visualized. Additional Comments: + [place] sign - c/w pulmonary embolus.  LEFT VENTRICLE PLAX 2D LVIDd:         3.20 cm   Diastology LVIDs:         2.40 cm   LV e' medial:    7.51 cm/s LV PW:         1.20 cm   LV E/e' medial:  7.2 LV IVS:        1.10 cm   LV e' lateral:   13.80 cm/s LVOT diam:     2.00 cm   LV E/e' lateral: 3.9 LV SV:         33 LV SV Index:   16 LVOT Area:     3.14 cm  RIGHT VENTRICLE  IVC RV Basal diam:  3.10 cm    IVC diam: 2.20 cm RV S prime:     9.03 cm/s TAPSE (M-mode): 1.1 cm LEFT ATRIUM             Index        RIGHT ATRIUM           Index LA diam:        2.90 cm 1.42 cm/m   RA Area:     13.40 cm LA Vol (A2C):   36.3 ml 17.72 ml/m  RA Volume:   33.00 ml  16.11 ml/m LA Vol (A4C):    34.8 ml 16.99 ml/m LA Biplane Vol: 37.2 ml 18.16 ml/m  AORTIC VALVE LVOT Vmax:   72.80 cm/s LVOT Vmean:  49.600 cm/s LVOT VTI:    0.106 m  AORTA Ao Root diam: 2.90 cm Ao Asc diam:  2.60 cm MITRAL VALVE               TRICUSPID VALVE MV Area (PHT): 7.16 cm    TR Peak grad:   8.1 mmHg MV Decel Time: 106 msec    TR Vmax:        142.00 cm/s MV E velocity: 53.70 cm/s MV A velocity: 76.50 cm/s  SHUNTS MV E/A ratio:  0.70        Systemic VTI:  0.11 m                            Systemic Diam: 2.00 cm Mertie Moores MD Electronically signed by Mertie Moores MD Signature Date/Time: 07/30/2021/12:49:33 PM    Final    VAS [place] LOWER EXTREMITY VENOUS (DVT) (7a-7p)  Result Date: 07/29/2021  Lower Venous DVT Study Patient Name:  CATHLEEN YAGI  Date of Exam:   07/29/2021 Medical Rec #: [id number]       Accession #:    [id number] Date of Birth: 1991/07/17        Patient Gender: F Patient Age:   22 years Exam Location:  [facility] Procedure:      VAS [place] LOWER EXTREMITY VENOUS (DVT) Referring Phys: Andee Poles RAY --------------------------------------------------------------------------------  Indications: Swelling, and Pain.  Comparison Study: 05/19/21 prior Performing Technologist: Archie Patten RVS  Examination Guidelines: A complete evaluation includes B-mode imaging, spectral Doppler, color Doppler, and power Doppler as needed of all accessible portions of each vessel. Bilateral testing is considered an integral part of a complete examination. Limited examinations for reoccurring indications may be performed as noted. The reflux portion of the exam is performed with the patient in reverse Trendelenburg.  +-----+---------------+---------+-----------+----------+--------------+ RIGHTCompressibilityPhasicitySpontaneityPropertiesThrombus Aging +-----+---------------+---------+-----------+----------+--------------+ CFV  Full           Yes      Yes                                  +-----+---------------+---------+-----------+----------+--------------+   +---------+---------------+---------+-----------+----------+-----------------+ LEFT     CompressibilityPhasicitySpontaneityPropertiesThrombus Aging    +---------+---------------+---------+-----------+----------+-----------------+ CFV      Full           Yes      Yes                                    +---------+---------------+---------+-----------+----------+-----------------+ SFJ      Full                                                           +---------+---------------+---------+-----------+----------+-----------------+  FV Prox  Full                                                           +---------+---------------+---------+-----------+----------+-----------------+ FV Mid   Full                                                           +---------+---------------+---------+-----------+----------+-----------------+ FV DistalFull                                                           +---------+---------------+---------+-----------+----------+-----------------+ PFV      Full                                                           +---------+---------------+---------+-----------+----------+-----------------+ POP      None           No       No                   Age Indeterminate +---------+---------------+---------+-----------+----------+-----------------+ PTV      None                                         Age Indeterminate +---------+---------------+---------+-----------+----------+-----------------+ PERO     None                                         Age Indeterminate +---------+---------------+---------+-----------+----------+-----------------+    Summary: RIGHT: - No evidence of common femoral vein obstruction.  LEFT: - Findings consistent with age indeterminate deep vein thrombosis involving the left popliteal vein, left posterior tibial veins, and left  peroneal veins. - No cystic structure found in the popliteal fossa.  *See table(s) above for measurements and observations. Electronically signed by Orlie Pollen on 07/29/2021 at 49:16:22 PM.    Final     ASSESSMENT & PLAN Rayelle Armor is a 30 y.o. female who presents to the clinic for recurrent PE and DVT. Patient's first episode (PE and right LE DVT) was in December 2020 and was felt to be secondary to COVID infection. She was prescribed Eliquis but she discontinued the medication on her own after two months. She presented last month with recurrent PE and left LE DVT. She was discharged on Xarelto and continues to be compliant with the medication.   I reviewed provoking factors that can cause venous thromboembolism including recent surgery, travel, prolonged immobility, hormone therapy, pregnancy, infection and smoking tobacco.   Patient will proceed with serologic evaluation and check CBC, CMP and full hypercoagulable workup. If above workup is negative, there recommendation is  indefinite anticoagulation.   #Recurrent DVT and PE: --First episode form December 2020 was felt to be secondary to COVID infection.  --No definitive risk factors identified for recent episode in October 2022.  --Currently on Xarelto 20 mg once daily. Patient is compliant and tolerating well. Recommend to continue indefinitely.  --Labs today to check CBC, CMP, factor V Leiden, prothrombin gene, Antithrombin III, lupus anticoagulant, anticardiolipin antibodies, beta-2 glycoprotein antibodies, homocystine, protein C and protein S activity. --RTC in 5 months with labs to determine if patient should switch to maintenance dose of Xarelto.   Orders Placed This Encounter  Procedures   CBC with Differential ([place] Only)    Standing Status:   Future    Standing Expiration Date:   08/27/2022   CMP ([place] only)    Standing Status:   Future    Standing Expiration Date:   08/27/2022   Antithrombin III    Protein C activity   Protein C, total   Protein S activity   Protein S, total   Lupus anticoagulant panel   Beta-2-glycoprotein i abs, IgG/M/A   Homocysteine, serum   Factor 5 leiden   Prothrombin gene mutation   Cardiolipin antibodies, IgG, IgM, IgA    All questions were answered. The patient knows to call the clinic with any problems, questions or concerns.  I have spent a total of 60 minutes minutes of face-to-face and non-face-to-face time, preparing to see the patient, obtaining and/or reviewing separately obtained history, performing a medically appropriate examination, counseling and educating the patient, ordering tests,documenting clinical information in the electronic health record,  and care coordination.   Dede Query, PA-C Department of Hematology/Oncology [place] [facility] Phone: [phone]  Patient was seen with Dr. Lorenso Courier.   I have read the above note and personally examined the patient. I agree with the assessment and plan as noted above.  Briefly Ms. Rosero is a 30 year old female who presents for evaluation of recurrent VTE.  Her initial VTE in 2020 was thought to be provoked due to COVID infection.  Her most recent VTE in October 2022 is less clear in terms of the cause.  At this time would recommend a focused hypercoagulation work-up.  Would recommend continuation of Xarelto 20 mg p.o. daily for now with reevaluation at the 33-monthmark for consideration of maintenance therapy.   JLedell Peoples MD Department of Hematology/Oncology [facility]Phone: [phone]: [phone]: [email or web address]

## 2021-09-05 ENCOUNTER — Emergency Department (HOSPITAL_COMMUNITY): Payer: Medicaid Other

## 2021-09-05 ENCOUNTER — Emergency Department (HOSPITAL_COMMUNITY)
Admission: EM | Admit: 2021-09-05 | Discharge: 2021-09-05 | Disposition: A | Discharge status: Home / Self Care | Payer: Medicaid Other | Attending: Emergency Medicine | Admitting: Emergency Medicine

## 2021-09-05 ENCOUNTER — Other Ambulatory Visit: Payer: Self-pay

## 2021-09-05 DIAGNOSIS — K920 Hematemesis: Secondary | ICD-10-CM

## 2021-09-05 DIAGNOSIS — Z8616 Personal history of COVID-19: Secondary | ICD-10-CM | POA: Insufficient documentation

## 2021-09-05 DIAGNOSIS — Z7901 Long term (current) use of anticoagulants: Secondary | ICD-10-CM | POA: Insufficient documentation

## 2021-09-05 DIAGNOSIS — R042 Hemoptysis: Secondary | ICD-10-CM | POA: Insufficient documentation

## 2021-09-05 DIAGNOSIS — Z5320 Procedure and treatment not carried out because of patient's decision for unspecified reasons: Secondary | ICD-10-CM | POA: Insufficient documentation

## 2021-09-05 DIAGNOSIS — Z7984 Long term (current) use of oral hypoglycemic drugs: Secondary | ICD-10-CM | POA: Insufficient documentation

## 2021-09-05 DIAGNOSIS — Z794 Long term (current) use of insulin: Secondary | ICD-10-CM | POA: Insufficient documentation

## 2021-09-05 DIAGNOSIS — E119 Type 2 diabetes mellitus without complications: Secondary | ICD-10-CM | POA: Insufficient documentation

## 2021-09-05 MED ORDER — SODIUM CHLORIDE 0.9 % IV BOLUS: 1000.0000 mL | Freq: Once | INTRAVENOUS | Status: DC

## 2021-09-05 NOTE — ED Triage Notes (Addendum)
Pt BIB EMS due to vomiting blood. Pt states she is on a blood thinner and has been vomiting for a week. Pt is axox4. BS 539 with EMs and HR 140's. Pt is a type 1 diabetic

## 2021-09-05 NOTE — ED Provider Notes (Addendum)
[facility] EMERGENCY DEPARTMENT Provider Note   CSN: [id number] Arrival date & time: 09/05/21  1357     History Chief Complaint  Patient presents with   Hemoptysis    Annette [name] is a 30 y.o. female.  30 year old female presented via EMS for hemoptysis/hematemesis, abdominal pain, heart rates of 140s.  She is a type I diabetic and has been noncompliant with her insulin regimen, recent diagnosis of PE unsure of compliance on anticoagulation.  She reports she has been vomiting blood for the past 5 days.  Reports she has had multiple episodes per day.  She denies fever, chills.  However she does not comply with interview.   The history is provided by the patient. No language interpreter was used.      Past Medical History:  Diagnosis Date   Ankle fracture    Diabetes mellitus without complication [facility])     Patient Active Problem List   Diagnosis Date Noted   Pulmonary embolism ([place]) 07/29/2021   Deep vein thrombosis (DVT) of right lower extremity (HCC)    Acetaminophen overdose, accidental or unintentional, initial encounter 03/11/2021   Type 2 diabetes mellitus ([place]) 03/11/2021   Pain, dental 03/11/2021   Acute deep vein thrombosis (DVT) of proximal vein of right lower extremity ([place])    COVID-19    Pulmonary embolus ([place]) 09/16/2019    No past surgical history on file.   OB History   No obstetric history on file.     Family History  Problem Relation Age of Onset   Diabetes Mother    Stroke Maternal Grandmother    Diabetes Other     Social History   Tobacco Use   Smoking status: Never   Smokeless tobacco: Never  Substance Use Topics   Alcohol use: Not Currently   Drug use: Yes    Types: Marijuana    Home Medications Prior to Admission medications   Medication Sig Start Date End Date Taking? Authorizing Provider  Accu-Chek Softclix Lancets lancets Use as directed. 07/30/21   Lenoria Chime, MD  Blood Glucose Monitoring Suppl  (BLOOD GLUCOSE MONITOR SYSTEM) w/Device KIT Use to test blood sugars up to 4 times daily as directed. 07/30/21   Lenoria Chime, MD  diphenhydramine-acetaminophen (TYLENOL PM) 25-500 MG TABS tablet Take 2 tablets by mouth at bedtime as needed (headache/pain/sleep/fever).    [provider]  glucose blood test strip Use to test blood sugars up to 4 times daily as directed. 07/30/21   Lenoria Chime, MD  insulin glargine (LANTUS) 100 UNIT/ML Solostar Pen Inject 30 Units into the skin daily. 08/01/21   Carollee Leitz, MD  Insulin Pen Needle 32G X 4 MM MISC Use to inject insulin as directed up to 4 times daily. 07/30/21   Lenoria Chime, MD  metFORMIN (GLUCOPHAGE XR) 500 MG 24 hr tablet Take 1 tablet (500 mg total) by mouth daily with breakfast. 08/04/21 08/04/22  Carollee Leitz, MD  RIVAROXABAN Alveda Reasons) VTE STARTER PACK (15 & 20 MG) Follow package directions: Take one 57m tablet by mouth twice a day. On day 22, switch to one 255mtablet once a day. Take with food. 07/30/21   PrLenoria ChimeMD    Allergies    Patient has no known allergies.  Review of Systems   Review of Systems  Unable to perform ROS: Acuity of condition  Constitutional:  Negative for chills and fever.  Respiratory:  Negative for shortness of breath.   Cardiovascular:  Negative for chest pain.  Gastrointestinal:  Positive for abdominal pain, nausea and vomiting.   Physical Exam Updated Vital Signs BP 123/88   Pulse (!) 143   Temp 98.7 F (37.1 C) (Oral)   Resp (!) 21   Ht _0  (1.676 m)   Wt 96.2 kg   SpO2 100%   BMI 34.22 kg/m   Physical Exam Vitals and nursing note reviewed.  Constitutional:      General: She is in acute distress.  HENT:     Head: Normocephalic and atraumatic.     Nose: Nose normal.  Eyes:     General: No scleral icterus.    Extraocular Movements: Extraocular movements intact.     Conjunctiva/sclera: Conjunctivae normal.  Cardiovascular:     Rate and Rhythm: Regular rhythm.  Tachycardia present.     Pulses: Normal pulses.  Pulmonary:     Effort: Pulmonary effort is normal. No respiratory distress.     Breath sounds: Normal breath sounds. No wheezing.  Abdominal:     General: There is no distension.     Tenderness: There is abdominal tenderness. There is no rebound.  Musculoskeletal:        General: Normal range of motion.     Cervical back: Normal range of motion.  Skin:    General: Skin is warm and dry.  Neurological:     General: No focal deficit present.     Mental Status: Mental status is at baseline.    ED Results / Procedures / Treatments   Labs (all labs ordered are listed, but only abnormal results are displayed) Labs Reviewed  CBC WITH DIFFERENTIAL/PLATELET  COMPREHENSIVE METABOLIC PANEL  LIPASE, BLOOD  URINALYSIS, ROUTINE W REFLEX MICROSCOPIC  I-STAT BETA HCG BLOOD, ED ([facility], [facility], [facility] ONLY)  CBG MONITORING, ED  TROPONIN I (HIGH SENSITIVITY)    EKG None  Radiology No results found.  Procedures Procedures   Medications Ordered in ED Medications  sodium chloride 0.9 % bolus 1,000 mL (has no administration in time range)    ED Course  I have reviewed the triage vital signs and the nursing notes.  Pertinent labs & imaging results that were available during my care of the patient were reviewed by me and considered in my medical decision making (see chart for details).    MDM Rules/Calculators/A&P                           30 year old female presents today for evaluation of hematemesis/hemoptysis of 5-day duration.  Patient also had an elevated blood sugar with EMS into the 500s.  On evaluation patient appears ill and has a current heart rate of 144.  She is sinus tach.  She was recently discharged in October with diagnosis of PE.  She states she is compliant with anticoagulation but is unable to tell me when her last dose was.  She is also type I diabetic reports noncompliance with her insulin regimen.  Patient on exam does have  tenderness to palpation of her abdomen diffusely.  Believe patient's heart rate is likely physiological response of her current clinical picture.  We will provide patient with 1 L fluid bolus obtain CTA chest, CT abdomen pelvis to evaluate for PE and the abdominal tenderness present on exam along with hematemesis.  Will await labs and determine if patient is in DKA.  Notified after examining patient and ordering labs and images that patient eloped.   Final Clinical Impression(s) /  ED Diagnoses Final diagnoses:  Hematemesis, unspecified whether nausea present    Rx / DC Orders ED Discharge Orders     None        Evlyn Courier, PA-C 09/05/21 1535    [address], PA-C 09/05/21 1536    Davonna Belling, MD 09/05/21 1902

## 2021-09-05 NOTE — ED Notes (Signed)
Pt will not cooperate with staff. Continues to ask for ice hips or water. Staff has mentioned to pt that beverages are not allowed until testing has been done and provider approves. Pt stated that she wanted to go home and did not want help.

## 2021-09-05 NOTE — ED Notes (Signed)
Pt states she is going to leave. RN explained why it is important for her to stay. Pt states she didn't care and wanted to leave. Pt is axox4. PA notified.

## 2021-09-06 ENCOUNTER — Inpatient Hospital Stay (HOSPITAL_COMMUNITY): Payer: Self-pay

## 2021-09-06 ENCOUNTER — Inpatient Hospital Stay (HOSPITAL_COMMUNITY)
Admission: EM | Admit: 2021-09-06 | Discharge: 2021-09-14 | DRG: 871 | Discharge status: Against Medical Advice | Payer: Self-pay | Attending: Family Medicine | Admitting: Family Medicine

## 2021-09-06 ENCOUNTER — Other Ambulatory Visit: Payer: Self-pay

## 2021-09-06 ENCOUNTER — Emergency Department (HOSPITAL_COMMUNITY): Payer: Self-pay

## 2021-09-06 ENCOUNTER — Inpatient Hospital Stay: Payer: Self-pay | Attending: Physician Assistant

## 2021-09-06 ENCOUNTER — Encounter (HOSPITAL_COMMUNITY): Payer: Self-pay

## 2021-09-06 DIAGNOSIS — Z4659 Encounter for fitting and adjustment of other gastrointestinal appliance and device: Secondary | ICD-10-CM

## 2021-09-06 DIAGNOSIS — Z8616 Personal history of COVID-19: Secondary | ICD-10-CM

## 2021-09-06 DIAGNOSIS — Z6832 Body mass index (BMI) 32.0-32.9, adult: Secondary | ICD-10-CM

## 2021-09-06 DIAGNOSIS — R042 Hemoptysis: Secondary | ICD-10-CM | POA: Diagnosis present

## 2021-09-06 DIAGNOSIS — E860 Dehydration: Secondary | ICD-10-CM | POA: Diagnosis present

## 2021-09-06 DIAGNOSIS — Z86711 Personal history of pulmonary embolism: Secondary | ICD-10-CM

## 2021-09-06 DIAGNOSIS — F321 Major depressive disorder, single episode, moderate: Secondary | ICD-10-CM | POA: Diagnosis present

## 2021-09-06 DIAGNOSIS — A4151 Sepsis due to Escherichia coli [E. coli]: Principal | ICD-10-CM | POA: Diagnosis present

## 2021-09-06 DIAGNOSIS — N390 Urinary tract infection, site not specified: Secondary | ICD-10-CM | POA: Diagnosis present

## 2021-09-06 DIAGNOSIS — Z833 Family history of diabetes mellitus: Secondary | ICD-10-CM

## 2021-09-06 DIAGNOSIS — R000 Tachycardia, unspecified: Secondary | ICD-10-CM | POA: Diagnosis present

## 2021-09-06 DIAGNOSIS — E861 Hypovolemia: Secondary | ICD-10-CM | POA: Diagnosis present

## 2021-09-06 DIAGNOSIS — E878 Other disorders of electrolyte and fluid balance, not elsewhere classified: Secondary | ICD-10-CM | POA: Diagnosis present

## 2021-09-06 DIAGNOSIS — N170 Acute kidney failure with tubular necrosis: Secondary | ICD-10-CM | POA: Diagnosis present

## 2021-09-06 DIAGNOSIS — I959 Hypotension, unspecified: Secondary | ICD-10-CM | POA: Diagnosis present

## 2021-09-06 DIAGNOSIS — I2699 Other pulmonary embolism without acute cor pulmonale: Secondary | ICD-10-CM | POA: Diagnosis present

## 2021-09-06 DIAGNOSIS — Z794 Long term (current) use of insulin: Secondary | ICD-10-CM

## 2021-09-06 DIAGNOSIS — R45851 Suicidal ideations: Secondary | ICD-10-CM

## 2021-09-06 DIAGNOSIS — R112 Nausea with vomiting, unspecified: Secondary | ICD-10-CM | POA: Diagnosis present

## 2021-09-06 DIAGNOSIS — Z7901 Long term (current) use of anticoagulants: Secondary | ICD-10-CM

## 2021-09-06 DIAGNOSIS — E1110 Type 2 diabetes mellitus with ketoacidosis without coma: Principal | ICD-10-CM | POA: Diagnosis present

## 2021-09-06 DIAGNOSIS — R7401 Elevation of levels of liver transaminase levels: Secondary | ICD-10-CM

## 2021-09-06 DIAGNOSIS — E43 Unspecified severe protein-calorie malnutrition: Secondary | ICD-10-CM | POA: Diagnosis present

## 2021-09-06 DIAGNOSIS — E870 Hyperosmolality and hypernatremia: Secondary | ICD-10-CM | POA: Diagnosis not present

## 2021-09-06 DIAGNOSIS — D6832 Hemorrhagic disorder due to extrinsic circulating anticoagulants: Secondary | ICD-10-CM | POA: Diagnosis present

## 2021-09-06 DIAGNOSIS — D649 Anemia, unspecified: Secondary | ICD-10-CM | POA: Diagnosis present

## 2021-09-06 DIAGNOSIS — E1142 Type 2 diabetes mellitus with diabetic polyneuropathy: Secondary | ICD-10-CM | POA: Diagnosis present

## 2021-09-06 DIAGNOSIS — D696 Thrombocytopenia, unspecified: Secondary | ICD-10-CM | POA: Diagnosis present

## 2021-09-06 DIAGNOSIS — E119 Type 2 diabetes mellitus without complications: Secondary | ICD-10-CM

## 2021-09-06 DIAGNOSIS — T45515A Adverse effect of anticoagulants, initial encounter: Secondary | ICD-10-CM | POA: Diagnosis present

## 2021-09-06 DIAGNOSIS — F329 Major depressive disorder, single episode, unspecified: Secondary | ICD-10-CM

## 2021-09-06 DIAGNOSIS — Z823 Family history of stroke: Secondary | ICD-10-CM

## 2021-09-06 DIAGNOSIS — F411 Generalized anxiety disorder: Secondary | ICD-10-CM | POA: Diagnosis present

## 2021-09-06 DIAGNOSIS — E669 Obesity, unspecified: Secondary | ICD-10-CM | POA: Diagnosis present

## 2021-09-06 DIAGNOSIS — R45850 Homicidal ideations: Secondary | ICD-10-CM | POA: Diagnosis present

## 2021-09-06 DIAGNOSIS — E876 Hypokalemia: Secondary | ICD-10-CM | POA: Diagnosis present

## 2021-09-06 DIAGNOSIS — G9341 Metabolic encephalopathy: Secondary | ICD-10-CM

## 2021-09-06 DIAGNOSIS — Z20822 Contact with and (suspected) exposure to covid-19: Secondary | ICD-10-CM | POA: Diagnosis present

## 2021-09-06 DIAGNOSIS — K920 Hematemesis: Secondary | ICD-10-CM | POA: Diagnosis present

## 2021-09-06 DIAGNOSIS — R821 Myoglobinuria: Secondary | ICD-10-CM | POA: Diagnosis present

## 2021-09-06 DIAGNOSIS — Z86718 Personal history of other venous thrombosis and embolism: Secondary | ICD-10-CM

## 2021-09-06 DIAGNOSIS — E1121 Type 2 diabetes mellitus with diabetic nephropathy: Secondary | ICD-10-CM | POA: Diagnosis present

## 2021-09-06 DIAGNOSIS — Z5329 Procedure and treatment not carried out because of patient's decision for other reasons: Secondary | ICD-10-CM | POA: Diagnosis not present

## 2021-09-06 DIAGNOSIS — G928 Other toxic encephalopathy: Secondary | ICD-10-CM | POA: Diagnosis present

## 2021-09-06 DIAGNOSIS — N179 Acute kidney failure, unspecified: Secondary | ICD-10-CM | POA: Diagnosis present

## 2021-09-06 DIAGNOSIS — E1011 Type 1 diabetes mellitus with ketoacidosis with coma: Secondary | ICD-10-CM

## 2021-09-06 DIAGNOSIS — Z7984 Long term (current) use of oral hypoglycemic drugs: Secondary | ICD-10-CM

## 2021-09-06 HISTORY — DX: COVID-19: U07.1

## 2021-09-06 HISTORY — DX: Acute embolism and thrombosis of unspecified deep veins of right proximal lower extremity: I82.4Y1

## 2021-09-06 HISTORY — DX: Acute embolism and thrombosis of unspecified deep veins of right lower extremity: I82.401

## 2021-09-06 LAB — RESP PANEL BY RT-PCR (FLU A&B, COVID) ARPGX2
Influenza A by PCR: NEGATIVE
Influenza B by PCR: NEGATIVE
SARS Coronavirus 2 by RT PCR: NEGATIVE

## 2021-09-06 LAB — BASIC METABOLIC PANEL
Anion gap: 27 — ABNORMAL HIGH (ref 5–15)
Anion gap: 25 — ABNORMAL HIGH (ref 5–15)
Anion gap: 17 — ABNORMAL HIGH (ref 5–15)
BUN: 40 mg/dL — ABNORMAL HIGH (ref 6–20)
BUN: 38 mg/dL — ABNORMAL HIGH (ref 6–20)
BUN: 39 mg/dL — ABNORMAL HIGH (ref 6–20)
CO2: 7 mmol/L — ABNORMAL LOW (ref 22–32)
CO2: 8 mmol/L — ABNORMAL LOW (ref 22–32)
CO2: 15 mmol/L — ABNORMAL LOW (ref 22–32)
Calcium: 9.9 mg/dL (ref 8.9–10.3)
Calcium: 10.4 mg/dL — ABNORMAL HIGH (ref 8.9–10.3)
Calcium: 10.2 mg/dL (ref 8.9–10.3)
Chloride: 100 mmol/L (ref 98–111)
Chloride: 113 mmol/L — ABNORMAL HIGH (ref 98–111)
Chloride: 117 mmol/L — ABNORMAL HIGH (ref 98–111)
Creatinine, Ser: 3.46 mg/dL — ABNORMAL HIGH (ref 0.44–1.00)
Creatinine, Ser: 2.88 mg/dL — ABNORMAL HIGH (ref 0.44–1.00)
Creatinine, Ser: 2.53 mg/dL — ABNORMAL HIGH (ref 0.44–1.00)
GFR, Estimated: 18 mL/min — ABNORMAL LOW (ref >60)
GFR, Estimated: 22 mL/min — ABNORMAL LOW (ref >60)
GFR, Estimated: 26 mL/min — ABNORMAL LOW (ref >60)
Glucose, Bld: 1163 mg/dL (ref 70–99)
Glucose, Bld: 733 mg/dL (ref 70–99)
Glucose, Bld: 418 mg/dL — ABNORMAL HIGH (ref 70–99)
Potassium: 4.8 mmol/L (ref 3.5–5.1)
Potassium: 2.6 mmol/L — CL (ref 3.5–5.1)
Potassium: 2.6 mmol/L — CL (ref 3.5–5.1)
Sodium: 134 mmol/L — ABNORMAL LOW (ref 135–145)
Sodium: 146 mmol/L — ABNORMAL HIGH (ref 135–145)
Sodium: 149 mmol/L — ABNORMAL HIGH (ref 135–145)

## 2021-09-06 LAB — POCT I-STAT 7, (LYTES, BLD GAS, ICA,H+H)
Acid-base deficit: 8 mmol/L — ABNORMAL HIGH (ref 0.0–2.0)
Bicarbonate: 14.7 mmol/L — ABNORMAL LOW (ref 20.0–28.0)
Calcium, Ion: 1.4 mmol/L (ref 1.15–1.40)
HCT: 42 % (ref 36.0–46.0)
Hemoglobin: 14.3 g/dL (ref 12.0–15.0)
O2 Saturation: 98 %
Patient temperature: 99
Potassium: 3.4 mmol/L — ABNORMAL LOW (ref 3.5–5.1)
Sodium: 155 mmol/L — ABNORMAL HIGH (ref 135–145)
TCO2: 15 mmol/L — ABNORMAL LOW (ref 22–32)
pCO2 arterial: 24 mmHg — ABNORMAL LOW (ref 32.0–48.0)
pH, Arterial: 7.397 (ref 7.350–7.450)
pO2, Arterial: 99 mmHg (ref 83.0–108.0)

## 2021-09-06 LAB — CBC WITH DIFFERENTIAL/PLATELET
Abs Immature Granulocytes: 0 10*3/uL (ref 0.00–0.07)
Basophils Absolute: 0 10*3/uL (ref 0.0–0.1)
Basophils Relative: 0 %
Eosinophils Absolute: 0 10*3/uL (ref 0.0–0.5)
Eosinophils Relative: 0 %
HCT: 51.7 % — ABNORMAL HIGH (ref 36.0–46.0)
Hemoglobin: 15.3 g/dL — ABNORMAL HIGH (ref 12.0–15.0)
Lymphocytes Relative: 5 %
Lymphs Abs: 1.1 10*3/uL (ref 0.7–4.0)
MCH: 28.7 pg (ref 26.0–34.0)
MCHC: 29.6 g/dL — ABNORMAL LOW (ref 30.0–36.0)
MCV: 97 fL (ref 80.0–100.0)
Monocytes Absolute: 1.3 10*3/uL — ABNORMAL HIGH (ref 0.1–1.0)
Monocytes Relative: 6 %
Neutro Abs: 18.9 10*3/uL — ABNORMAL HIGH (ref 1.7–7.7)
Neutrophils Relative %: 89 %
Platelets: 226 10*3/uL (ref 150–400)
RBC: 5.33 MIL/uL — ABNORMAL HIGH (ref 3.87–5.11)
RDW: 13.6 % (ref 11.5–15.5)
WBC: 21.2 10*3/uL — ABNORMAL HIGH (ref 4.0–10.5)
nRBC: 0 % (ref 0.0–0.2)
nRBC: 0 /100 WBC

## 2021-09-06 LAB — I-STAT VENOUS BLOOD GAS, ED
Acid-base deficit: 23 mmol/L — ABNORMAL HIGH (ref 0.0–2.0)
Bicarbonate: 6.8 mmol/L — ABNORMAL LOW (ref 20.0–28.0)
Calcium, Ion: 1.25 mmol/L (ref 1.15–1.40)
HCT: 51 % — ABNORMAL HIGH (ref 36.0–46.0)
Hemoglobin: 17.3 g/dL — ABNORMAL HIGH (ref 12.0–15.0)
O2 Saturation: 67 %
Potassium: 5.9 mmol/L — ABNORMAL HIGH (ref 3.5–5.1)
Sodium: 138 mmol/L (ref 135–145)
TCO2: 8 mmol/L — ABNORMAL LOW (ref 22–32)
pCO2, Ven: 25.9 mmHg — ABNORMAL LOW (ref 44.0–60.0)
pH, Ven: 7.027 — CL (ref 7.250–7.430)
pO2, Ven: 50 mmHg — ABNORMAL HIGH (ref 32.0–45.0)

## 2021-09-06 LAB — URINALYSIS, ROUTINE W REFLEX MICROSCOPIC
Glucose, UA: >=500 mg/dL — AB
Ketones, ur: >80 mg/dL — AB
Leukocytes,Ua: NEGATIVE
Nitrite: POSITIVE — AB
Protein, ur: 100 mg/dL — AB
Specific Gravity, Urine: >1.03 — ABNORMAL HIGH (ref 1.005–1.030)
pH: 5.5 (ref 5.0–8.0)

## 2021-09-06 LAB — ETHANOL: Alcohol, Ethyl (B): <10 mg/dL (ref <10)

## 2021-09-06 LAB — GLUCOSE, CAPILLARY
Glucose-Capillary: >600 mg/dL (ref 70–99)
Glucose-Capillary: >600 mg/dL (ref 70–99)
Glucose-Capillary: >600 mg/dL (ref 70–99)
Glucose-Capillary: >600 mg/dL (ref 70–99)
Glucose-Capillary: 519 mg/dL (ref 70–99)
Glucose-Capillary: 584 mg/dL (ref 70–99)
Glucose-Capillary: 524 mg/dL (ref 70–99)
Glucose-Capillary: 530 mg/dL (ref 70–99)
Glucose-Capillary: 427 mg/dL — ABNORMAL HIGH (ref 70–99)
Glucose-Capillary: 343 mg/dL — ABNORMAL HIGH (ref 70–99)
Glucose-Capillary: 343 mg/dL — ABNORMAL HIGH (ref 70–99)
Glucose-Capillary: 322 mg/dL — ABNORMAL HIGH (ref 70–99)
Glucose-Capillary: 270 mg/dL — ABNORMAL HIGH (ref 70–99)

## 2021-09-06 LAB — URINALYSIS, MICROSCOPIC (REFLEX)

## 2021-09-06 LAB — CBG MONITORING, ED
Glucose-Capillary: >600 mg/dL (ref 70–99)
Glucose-Capillary: >600 mg/dL (ref 70–99)
Glucose-Capillary: >600 mg/dL (ref 70–99)

## 2021-09-06 LAB — BRAIN NATRIURETIC PEPTIDE
B Natriuretic Peptide: 22.3 pg/mL (ref 0.0–100.0)
B Natriuretic Peptide: 32 pg/mL (ref 0.0–100.0)

## 2021-09-06 LAB — MRSA NEXT GEN BY PCR, NASAL: MRSA by PCR Next Gen: NOT DETECTED

## 2021-09-06 LAB — TROPONIN I (HIGH SENSITIVITY): Troponin I (High Sensitivity): 21 ng/L — ABNORMAL HIGH (ref <18)

## 2021-09-06 LAB — BETA-HYDROXYBUTYRIC ACID: Beta-Hydroxybutyric Acid: >8 mmol/L — ABNORMAL HIGH (ref 0.05–0.27)

## 2021-09-06 LAB — I-STAT BETA HCG BLOOD, ED (MC, WL, AP ONLY): I-stat hCG, quantitative: 45.7 m[IU]/mL — ABNORMAL HIGH (ref <5)

## 2021-09-06 LAB — LACTIC ACID, PLASMA
Lactic Acid, Venous: 4 mmol/L (ref 0.5–1.9)
Lactic Acid, Venous: 4 mmol/L (ref 0.5–1.9)

## 2021-09-06 LAB — HCG, QUANTITATIVE, PREGNANCY: hCG, Beta Chain, Quant, S: <1 m[IU]/mL (ref <5)

## 2021-09-06 MED ORDER — LACTATED RINGERS IV BOLUS
1000.0000 mL | Freq: Once | INTRAVENOUS | Status: AC
  Administered 2021-09-06: 1000 mL via INTRAVENOUS

## 2021-09-06 MED ORDER — DEXMEDETOMIDINE HCL IN NACL 400 MCG/100ML IV SOLN
0.1000 ug/kg/h | INTRAVENOUS | Status: DC
  Administered 2021-09-06: 0.4 ug/kg/h via INTRAVENOUS
  Administered 2021-09-07: 0.2 ug/kg/h via INTRAVENOUS
  Administered 2021-09-07: 0.7 ug/kg/h via INTRAVENOUS
  Administered 2021-09-08: 0.2 ug/kg/h via INTRAVENOUS
  Filled 2021-09-06 (×3): qty 100

## 2021-09-06 MED ORDER — PANTOPRAZOLE SODIUM 40 MG IV SOLR
40.0000 mg | Freq: Every day | INTRAVENOUS | Status: DC
  Administered 2021-09-06 – 2021-09-09 (×4): 40 mg via INTRAVENOUS
  Filled 2021-09-06 (×4): qty 40

## 2021-09-06 MED ORDER — DEXTROSE 50 % IV SOLN: 0.0000 mL | INTRAVENOUS | Status: DC | PRN

## 2021-09-06 MED ORDER — LACTATED RINGERS IV SOLN: INTRAVENOUS | Status: DC

## 2021-09-06 MED ORDER — POTASSIUM CHLORIDE 10 MEQ/50ML IV SOLN
10.0000 meq | INTRAVENOUS | Status: AC
  Administered 2021-09-06 – 2021-09-07 (×6): 10 meq via INTRAVENOUS
  Filled 2021-09-06 (×3): qty 50

## 2021-09-06 MED ORDER — POLYETHYLENE GLYCOL 3350 17 G PO PACK: 17.0000 g | PACK | Freq: Every day | ORAL | Status: DC | PRN

## 2021-09-06 MED ORDER — PIPERACILLIN-TAZOBACTAM 3.375 G IVPB 30 MIN
3.3750 g | Freq: Once | INTRAVENOUS | Status: DC
  Filled 2021-09-06: qty 50

## 2021-09-06 MED ORDER — LACTATED RINGERS IV BOLUS: 20.0000 mL/kg | Freq: Once | INTRAVENOUS | Status: DC

## 2021-09-06 MED ORDER — DOCUSATE SODIUM 100 MG PO CAPS: 100.0000 mg | ORAL_CAPSULE | Freq: Two times a day (BID) | ORAL | Status: DC | PRN

## 2021-09-06 MED ORDER — INSULIN REGULAR(HUMAN) IN NACL 100-0.9 UT/100ML-% IV SOLN
INTRAVENOUS | Status: AC
  Administered 2021-09-06: 13 [IU]/h via INTRAVENOUS
  Administered 2021-09-06: 20:00:00 15 [IU]/h via INTRAVENOUS
  Administered 2021-09-07: 7.5 [IU]/h via INTRAVENOUS
  Filled 2021-09-06 (×3): qty 100

## 2021-09-06 MED ORDER — ONDANSETRON HCL 4 MG/2ML IJ SOLN
4.0000 mg | Freq: Four times a day (QID) | INTRAMUSCULAR | Status: DC | PRN
  Administered 2021-09-07 – 2021-09-12 (×6): 4 mg via INTRAVENOUS
  Filled 2021-09-06 (×6): qty 2

## 2021-09-06 MED ORDER — MIDAZOLAM HCL 2 MG/2ML IJ SOLN: 2.0000 mg | Freq: Once | INTRAMUSCULAR | Status: DC

## 2021-09-06 MED ORDER — LACTATED RINGERS IV BOLUS
1000.0000 mL | Freq: Once | INTRAVENOUS | Status: AC
Start: 2021-09-06 — End: 2021-09-06
  Administered 2021-09-06: 1000 mL via INTRAVENOUS

## 2021-09-06 MED ORDER — MIDAZOLAM HCL 2 MG/2ML IJ SOLN
INTRAMUSCULAR | Status: AC
  Filled 2021-09-06: qty 2

## 2021-09-06 MED ORDER — DEXTROSE IN LACTATED RINGERS 5 % IV SOLN: INTRAVENOUS | Status: DC

## 2021-09-06 NOTE — Progress Notes (Signed)
Dr. Isaiah Serge notified of critical serum potassium of 2.6 new orders received.

## 2021-09-06 NOTE — Procedures (Signed)
Central Venous Catheter Insertion Procedure Note  Annette [name]  [id number]  1991-09-24  Date:09/06/21  Time:4:57 PM   Provider Performing:Brooke Lodema Hong   Procedure: Insertion of Non-tunneled Central Venous Catheter(36556) with US guidance (88502)   Indication(s) Medication administration and Difficult access  Consent Unable to obtain consent due to emergent nature of procedure.  No NOK able to be reached, patient unable to consent with ongoing encephalopathy  Anesthesia Topical only with 1% lidocaine   Timeout Verified patient identification, verified procedure, site/side was marked, verified correct patient position, special equipment/implants available, medications/allergies/relevant history reviewed, required imaging and test results available.  Sterile Technique Maximal sterile technique including full sterile barrier drape, hand hygiene, sterile gown, sterile gloves, mask, hair covering, sterile ultrasound probe cover (if used).  Procedure Description Area of catheter insertion was cleaned with chlorhexidine and draped in sterile fashion.  With real-time ultrasound guidance a central venous catheter was placed into the right femoral vein. Nonpulsatile blood flow and easy flushing noted in all ports.  The catheter was sutured in place, biopatch, and sterile dressing applied.  A blood culture was obtained during procedure and sent to lab.       Complications/Tolerance None; patient tolerated the procedure well. Chest x-ray is not ordered for femoral cannulation.  EBL Minimal  Specimen(s) None     Posey Boyer, ACNP [facility] 09/06/2021, 4:58 PM  See Amion for pager If no response to pager, please call PCCM consult pager After 7:00 pm call Elink

## 2021-09-06 NOTE — Progress Notes (Signed)
eLink Physician-Brief Progress Note Patient Name: Annette [name] DOB: 1991-08-14 MRN: [id number]   Date of Service  09/06/2021  HPI/Events of Note  Patient with severe DKA and metabolic encephalopathy.  eICU Interventions  Stat ABG to ascertain acid-base status, stat BNP to assess volume status indirectly, Low dose Precedex gtt for encephalopathy, aggressive repletion of K+, patient is at high risk for an adverse outcome, will monitor closely.        Thomasene Lot Kenyata Guess 09/06/2021, 7:54 PM

## 2021-09-06 NOTE — ED Provider Notes (Signed)
[facility] EMERGENCY DEPARTMENT Provider Note   CSN: [id number] Arrival date & time: 09/06/21  1100     History Chief Complaint  Patient presents with   Altered Mental Status    Annette [name] is a 30 y.o. female.   Altered Mental Status Patient brought in for mental status change.  Had been seen in the ER yesterday but eloped.  Had been seen under my supervision but I have not personally seen her yesterday.  Now more confused.  Sugar elevated yesterday.  Reportedly had been coughing or vomiting up a little blood.  Also sugar was above 600.  Did have previous admission to the hospital for DVT/PE.  That was a little over a month ago.    Past Medical History:  Diagnosis Date   Ankle fracture    Diabetes mellitus without complication [facility])     Patient Active Problem List   Diagnosis Date Noted   DKA (diabetic ketoacidosis) ([place]) 09/06/2021   Pulmonary embolism ([place]) 07/29/2021   Deep vein thrombosis (DVT) of right lower extremity (HCC)    Acetaminophen overdose, accidental or unintentional, initial encounter 03/11/2021   Type 2 diabetes mellitus ([place]) 03/11/2021   Pain, dental 03/11/2021   Acute deep vein thrombosis (DVT) of proximal vein of right lower extremity ([place])    COVID-19    Pulmonary embolus ([place]) 09/16/2019    History reviewed. No pertinent surgical history.   OB History   No obstetric history on file.     Family History  Problem Relation Age of Onset   Diabetes Mother    Stroke Maternal Grandmother    Diabetes Other     Social History   Tobacco Use   Smoking status: Never   Smokeless tobacco: Never  Substance Use Topics   Alcohol use: Not Currently   Drug use: Yes    Types: Marijuana    Home Medications Prior to Admission medications   Medication Sig Start Date End Date Taking? Authorizing Provider  Accu-Chek Softclix Lancets lancets Use as directed. 07/30/21   Lenoria Chime, MD  Blood Glucose Monitoring Suppl (BLOOD  GLUCOSE MONITOR SYSTEM) w/Device KIT Use to test blood sugars up to 4 times daily as directed. 07/30/21   Lenoria Chime, MD  diphenhydramine-acetaminophen (TYLENOL PM) 25-500 MG TABS tablet Take 2 tablets by mouth at bedtime as needed (headache/pain/sleep/fever).    [provider]  glucose blood test strip Use to test blood sugars up to 4 times daily as directed. 07/30/21   Lenoria Chime, MD  insulin glargine (LANTUS) 100 UNIT/ML Solostar Pen Inject 30 Units into the skin daily. 08/01/21   Carollee Leitz, MD  Insulin Pen Needle 32G X 4 MM MISC Use to inject insulin as directed up to 4 times daily. 07/30/21   Lenoria Chime, MD  metFORMIN (GLUCOPHAGE XR) 500 MG 24 hr tablet Take 1 tablet (500 mg total) by mouth daily with breakfast. 08/04/21 08/04/22  Carollee Leitz, MD  RIVAROXABAN Alveda Reasons) VTE STARTER PACK (15 & 20 MG) Follow package directions: Take one 25m tablet by mouth twice a day. On day 22, switch to one 252mtablet once a day. Take with food. 07/30/21   PrLenoria ChimeMD    Allergies    Patient has no known allergies.  Review of Systems   Review of Systems  Unable to perform ROS: Mental status change   Physical Exam Updated Vital Signs BP 130/84   Pulse (!) 148   Temp 97.7 F (  36.5 C) (Axillary)   Resp (!) 47   SpO2 100%   Physical Exam Vitals and nursing note reviewed.  HENT:     Head: Atraumatic.     Mouth/Throat:     Mouth: Mucous membranes are dry.     Comments: Small amount of dried blood on lips. Cardiovascular:     Rate and Rhythm: Tachycardia present.  Pulmonary:     Comments: Tachypnea. Abdominal:     Tenderness: There is no abdominal tenderness.  Musculoskeletal:        General: No tenderness.     Cervical back: Neck supple.  Skin:    General: Skin is warm.  Neurological:     Comments: Minimally verbal.  Not really following commands.    ED Results / Procedures / Treatments   Labs (all labs ordered are listed, but only abnormal  results are displayed) Labs Reviewed  COMPREHENSIVE METABOLIC PANEL - Abnormal; Notable for the following components:      Result Value   Sodium 134 (*)    Potassium 5.9 (*)    CO2 7 (*)    Glucose, Bld 1,168 (*)    BUN 38 (*)    Creatinine, Ser 3.54 (*)    Total Protein 9.2 (*)    Albumin 2.9 (*)    AST 64 (*)    Alkaline Phosphatase 185 (*)    Total Bilirubin 3.5 (*)    GFR, Estimated 17 (*)    Anion gap 27 (*)    All other components within normal limits  CBC WITH DIFFERENTIAL/PLATELET - Abnormal; Notable for the following components:   WBC 21.2 (*)    RBC 5.33 (*)    Hemoglobin 15.3 (*)    HCT 51.7 (*)    MCHC 29.6 (*)    Neutro Abs 18.9 (*)    Monocytes Absolute 1.3 (*)    All other components within normal limits  BETA-HYDROXYBUTYRIC ACID - Abnormal; Notable for the following components:   Beta-Hydroxybutyric Acid >8.00 (*)    All other components within normal limits  LACTIC ACID, PLASMA - Abnormal; Notable for the following components:   Lactic Acid, Venous 4.0 (*)    All other components within normal limits  BASIC METABOLIC PANEL - Abnormal; Notable for the following components:   Sodium 134 (*)    CO2 7 (*)    Glucose, Bld 1,163 (*)    BUN 40 (*)    Creatinine, Ser 3.46 (*)    GFR, Estimated 18 (*)    Anion gap 27 (*)    All other components within normal limits  GLUCOSE, CAPILLARY - Abnormal; Notable for the following components:   Glucose-Capillary >600 (*)    All other components within normal limits  GLUCOSE, CAPILLARY - Abnormal; Notable for the following components:   Glucose-Capillary >600 (*)    All other components within normal limits  GLUCOSE, CAPILLARY - Abnormal; Notable for the following components:   Glucose-Capillary >600 (*)    All other components within normal limits  CBG MONITORING, ED - Abnormal; Notable for the following components:   Glucose-Capillary >600 (*)    All other components within normal limits  I-STAT BETA HCG BLOOD,  ED ([facility], [facility], [facility] ONLY) - Abnormal; Notable for the following components:   I-stat hCG, quantitative 45.7 (*)    All other components within normal limits  I-STAT VENOUS BLOOD GAS, ED - Abnormal; Notable for the following components:   pH, Ven 7.027 (*)    pCO2, Ven 25.9 (*)  pO2, Ven 50.0 (*)    Bicarbonate 6.8 (*)    TCO2 8 (*)    Acid-base deficit 23.0 (*)    Potassium 5.9 (*)    HCT 51.0 (*)    Hemoglobin 17.3 (*)    All other components within normal limits  CBG MONITORING, ED - Abnormal; Notable for the following components:   Glucose-Capillary >600 (*)    All other components within normal limits  CBG MONITORING, ED - Abnormal; Notable for the following components:   Glucose-Capillary >600 (*)    All other components within normal limits  RESP PANEL BY RT-PCR (FLU A&B, COVID) ARPGX2  CULTURE, BLOOD (ROUTINE X 2)  CULTURE, BLOOD (ROUTINE X 2)  MRSA NEXT GEN BY PCR, NASAL  ETHANOL  HCG, QUANTITATIVE, PREGNANCY  RAPID URINE DRUG SCREEN, HOSP PERFORMED  LACTIC ACID, PLASMA  BASIC METABOLIC PANEL  BASIC METABOLIC PANEL  BASIC METABOLIC PANEL  URINALYSIS, ROUTINE W REFLEX MICROSCOPIC  BRAIN NATRIURETIC PEPTIDE  TROPONIN I (HIGH SENSITIVITY)  TROPONIN I (HIGH SENSITIVITY)    EKG EKG Interpretation  Date/Time:  Monday September 06 2021 11:09:12 EST Ventricular Rate:  129 PR Interval:  131 QRS Duration: 91 QT Interval:  331 QTC Calculation: 485 R Axis:   95 Text Interpretation: Sinus tachycardia Consider right atrial enlargement Borderline right axis deviation Borderline prolonged QT interval Confirmed by Davonna Belling [phone]) on 09/06/2021 2:38:39 PM  Radiology DG Chest Portable 1 View  Result Date: 09/06/2021 CLINICAL DATA:  Reason for exam: AMS Patient non verbal throughout exam. Hx of diabetes. No known heart or lung surgeries. Per ed triage notes: "Pt bib [facility] with complaints of increased ams. Pt arrives tachypnic and only alert to name. EXAM: PORTABLE CHEST -  1 VIEW COMPARISON:  07/29/2021 FINDINGS: Relatively low lung volumes.  No focal infiltrate or overt edema. Heart size and mediastinal contours are within normal limits. No effusion.  No pneumothorax. Visualized bones unremarkable. IMPRESSION: Low volumes.  No acute cardiopulmonary disease. Electronically Signed   By: Lucrezia Europe M.D.   On: 09/06/2021 12:31    Procedures Procedures   Medications Ordered in ED Medications  piperacillin-tazobactam (ZOSYN) IVPB 3.375 g (0 g Intravenous Hold 09/06/21 1319)  lactated ringers bolus 1,924 mL (has no administration in time range)  insulin regular, human (MYXREDLIN) 100 units/ 100 mL infusion (13 Units/hr Intravenous Infusion Verify 09/06/21 1500)  lactated ringers infusion (has no administration in time range)  dextrose 5 % in lactated ringers infusion (has no administration in time range)  dextrose 50 % solution 0-50 mL (has no administration in time range)  docusate sodium (COLACE) capsule 100 mg (has no administration in time range)  polyethylene glycol (MIRALAX / GLYCOLAX) packet 17 g (has no administration in time range)  ondansetron (ZOFRAN) injection 4 mg (has no administration in time range)  pantoprazole (PROTONIX) injection 40 mg (has no administration in time range)  midazolam (VERSED) injection 2 mg (has no administration in time range)  midazolam (VERSED) 2 MG/2ML injection (has no administration in time range)  lactated ringers bolus 1,000 mL (1,000 mLs Intravenous New Bag/Given 09/06/21 1203)  lactated ringers bolus 1,000 mL (0 mLs Intravenous Stopped 09/06/21 1310)  lactated ringers bolus 1,000 mL (1,000 mLs Intravenous New Bag/Given 09/06/21 1318)    ED Course  I have reviewed the triage vital signs and the nursing notes.  Pertinent labs & imaging results that were available during my care of the patient were reviewed by me and considered in my medical decision making (  see chart for details).    MDM Rules/Calculators/A&P                            Patient with DKA.  Hyperglycemia.  Hypothermic.  Seen yesterday but eloped from ER.  With mental status changes and acidosis with DKA felt this to be a good patient for ICU.  3 units of lactated Ringer's given.  Found to have a creatinine over 3.  Sugar of over thousand.  Severe DKA. With mental status and DKA I think a unit candidate but not ideal to intubate at this time even with mental status changes due to respiratory compensation.  Will admit to ICU.  CRITICAL CARE Performed by: Davonna Belling Total critical care time: 30 minutes Critical care time was exclusive of separately billable procedures and treating other patients. Critical care was necessary to treat or prevent imminent or life-threatening deterioration. Critical care was time spent personally by me on the following activities: development of treatment plan with patient and/or surrogate as well as nursing, discussions with consultants, evaluation of patient's response to treatment, examination of patient, obtaining history from patient or surrogate, ordering and performing treatments and interventions, ordering and review of laboratory studies, ordering and review of radiographic studies, pulse oximetry and re-evaluation of patient's condition.  Final Clinical Impression(s) / ED Diagnoses Final diagnoses:  Diabetic ketoacidosis without coma associated with type 2 diabetes mellitus ([place])  AKI (acute kidney injury) ([place])  Transaminitis    Rx / DC Orders ED Discharge Orders     None        Davonna Belling, MD 09/06/21 1640

## 2021-09-06 NOTE — Progress Notes (Signed)
eLink Physician-Brief Progress Note Patient Name: Annette [name] DOB: 1991-04-24 MRN: [id number]   Date of Service  09/06/2021  HPI/Events of Note  ABG reviewed and significantly improved.  BNP 32, HR 145, urine output just 100 ml.  eICU Interventions  LR 1000 ml iv bolus over 2 hours ordered.        Thomasene Lot [name] 09/06/2021, 10:33 PM

## 2021-09-06 NOTE — Progress Notes (Signed)
Attempts to reach [facility], mother listed have been unsuccessful thus far.    Mother listed as Silas Flood with home phone [phone] -> no answer or voicemail  Cell phone listed was [phone], however the person answering states "quit calling me, this is not the person you are trying to reach" and hung up.  Number was deleted from contact info.       Posey Boyer, ACNP [facility] 09/06/2021, 4:01 PM  See Amion for pager If no response to pager, please call PCCM consult pager After 7:00 pm call Elink

## 2021-09-06 NOTE — Progress Notes (Signed)
IVT consulted for placement of an additional PIV.  KH assessed earlier and placed a PIV in the RUA.  Bilateral arms were assessed; no other suitable veins found.  Upon arrival for second consult, RN, Alycia Rossetti advised a CL will be placed.

## 2021-09-06 NOTE — ED Notes (Signed)
ED TO INPATIENT HANDOFF REPORT  ED Nurse Name and Phone #: Alycia Rossetti, [phone]  S Name/Age/Gender Annette [name] 30 y.o. female Room/Bed: 007C/007C  Code Status   Code Status: Full Code  Home/SNF/Other Home Patient oriented to: self and time Is this baseline? No   Triage Complete: Triage complete  Chief Complaint DKA (diabetic ketoacidosis) (HCC) [E11.10]  Triage Note Pt bib [facility] with complaints of increased ams. Pt arrives tachypnic and only alert to name. Pt will not follow commands. Pt was seen for same yesterday and left AMA.  EMS vitals: 110SBP, 582CBG,40RR, 9ETCO2, 140HR   Allergies No Known Allergies  Level of Care/Admitting Diagnosis ED Disposition     ED Disposition  Admit   Condition  --   Comment  Hospital [facility]: [facility] [[id number]]  Level of Care: ICU [6]  May admit patient to [facility] or [facility] if equivalent level of care is available:: No  Covid Evaluation: Asymptomatic Screening Protocol (No Symptoms)  Diagnosis: DKA (diabetic ketoacidosis) [facility]) [[id number]]  Admitting Physician: Chilton Greathouse [[id number]]  Attending Physician: Chilton Greathouse [[id number]]  Estimated length of stay: past midnight tomorrow  Certification:: I certify this patient will need inpatient services for at least 2 midnights          B Medical/Surgery History Past Medical History:  Diagnosis Date   Ankle fracture    Diabetes mellitus without complication (HCC)    History reviewed. No pertinent surgical history.   A IV Location/Drains/Wounds Patient Lines/Drains/Airways Status     Active Line/Drains/Airways     Name Placement date Placement time Site Days   Peripheral IV 09/06/21 20 G 1.88" Right;Upper Arm 09/06/21  1200  Arm  less than 1   Peripheral IV 09/06/21 22 G Left Hand 09/06/21  1318  Hand  less than 1   Pressure Injury 03/11/21 Foot Left;Right;Lateral Diabetic Ulcer 03/11/21  2311  -- 179            Intake/Output Last 24 hours No  intake or output data in the 24 hours ending 09/06/21 1405  Labs/Imaging Results for orders placed or performed during the hospital encounter of 09/06/21 (from the past 48 hour(s))  CBG monitoring, ED     Status: Abnormal   Collection Time: 09/06/21 11:07 AM  Result Value Ref Range   Glucose-Capillary >600 (HH) 70 - 99 mg/dL    Comment: Glucose reference range applies only to samples taken after fasting for at least 8 hours.  Ethanol     Status: None   Collection Time: 09/06/21 11:30 AM  Result Value Ref Range   Alcohol, Ethyl (B) <10 <10 mg/dL    Comment: (NOTE) Lowest detectable limit for serum alcohol is 10 mg/dL.  For medical purposes only. Performed at [facility], [address], [place], [place] [address]   Lactic acid, plasma     Status: Abnormal   Collection Time: 09/06/21 11:31 AM  Result Value Ref Range   Lactic Acid, Venous 4.0 (HH) 0.5 - 1.9 mmol/L    Comment: CRITICAL RESULT CALLED TO, READ BACK BY AND VERIFIED WITH: C COBB RN BY SSTEPHENS [id number] Performed at [facility], [address] [place], [place] [address]   I-Stat beta hCG blood, ED     Status: Abnormal   Collection Time: 09/06/21 12:05 PM  Result Value Ref Range   I-stat hCG, quantitative 45.7 (H) <5 mIU/mL   Comment 3  Comment:   GEST. AGE      CONC.  (mIU/mL)   <=1 WEEK        5 - 50     2 WEEKS       50 - 500     3 WEEKS       100 - 10,000     4 WEEKS     1,000 - 30,000        FEMALE AND NON-PREGNANT FEMALE:     LESS THAN 5 mIU/mL   I-Stat venous blood gas, ED     Status: Abnormal   Collection Time: 09/06/21 12:07 PM  Result Value Ref Range   pH, Ven 7.027 (LL) 7.250 - 7.430   pCO2, Ven 25.9 (L) 44.0 - 60.0 mmHg   pO2, Ven 50.0 (H) 32.0 - 45.0 mmHg   Bicarbonate 6.8 (L) 20.0 - 28.0 mmol/L   TCO2 8 (L) 22 - 32 mmol/L   O2 Saturation 67.0 %   Acid-base deficit 23.0 (H) 0.0 - 2.0 mmol/L   Sodium 138 135 - 145 mmol/L   Potassium 5.9 (H) 3.5 - 5.1 mmol/L   Calcium,  Ion 1.25 1.15 - 1.40 mmol/L   HCT 51.0 (H) 36.0 - 46.0 %   Hemoglobin 17.3 (H) 12.0 - 15.0 g/dL   Sample type VENOUS    Comment NOTIFIED PHYSICIAN   Comprehensive metabolic panel     Status: Abnormal (Preliminary result)   Collection Time: 09/06/21 12:10 PM  Result Value Ref Range   Sodium PENDING 135 - 145 mmol/L   Potassium PENDING 3.5 - 5.1 mmol/L   Chloride PENDING 98 - 111 mmol/L   CO2 PENDING 22 - 32 mmol/L   Glucose, Bld 1,168 (HH) 70 - 99 mg/dL    Comment: Glucose reference range applies only to samples taken after fasting for at least 8 hours.   BUN 38 (H) 6 - 20 mg/dL   Creatinine, Ser 1.88 (H) 0.44 - 1.00 mg/dL   Calcium 9.9 8.9 - 41.6 mg/dL   Total Protein 9.2 (H) 6.5 - 8.1 g/dL   Albumin 2.9 (L) 3.5 - 5.0 g/dL   AST 64 (H) 15 - 41 U/L   ALT 31 0 - 44 U/L   Alkaline Phosphatase 185 (H) 38 - 126 U/L   Total Bilirubin 3.5 (H) 0.3 - 1.2 mg/dL   GFR, Estimated 17 (L) >60 mL/min    Comment: (NOTE) Calculated using the CKD-EPI Creatinine Equation (2021) Performed at [facility], [address], [place], [place] [address]    Anion gap PENDING 5 - 15  CBC with Differential     Status: Abnormal   Collection Time: 09/06/21 12:10 PM  Result Value Ref Range   WBC 21.2 (H) 4.0 - 10.5 K/uL   RBC 5.33 (H) 3.87 - 5.11 MIL/uL   Hemoglobin 15.3 (H) 12.0 - 15.0 g/dL   HCT 16.0 (H) 10.9 - 32.3 %   MCV 97.0 80.0 - 100.0 fL   MCH 28.7 26.0 - 34.0 pg   MCHC 29.6 (L) 30.0 - 36.0 g/dL   RDW 55.7 32.2 - 02.5 %   Platelets 226 150 - 400 K/uL    Comment: REPEATED TO VERIFY   nRBC 0.0 0.0 - 0.2 %   Neutrophils Relative % 89 %   Neutro Abs 18.9 (H) 1.7 - 7.7 K/uL   Lymphocytes Relative 5 %   Lymphs Abs 1.1 0.7 - 4.0 K/uL   Monocytes Relative 6 %   Monocytes Absolute  1.3 (H) 0.1 - 1.0 K/uL   Eosinophils Relative 0 %   Eosinophils Absolute 0.0 0.0 - 0.5 K/uL   Basophils Relative 0 %   Basophils Absolute 0.0 0.0 - 0.1 K/uL   WBC Morphology See Note     Comment: Increased  Bands. >20% Bands  Vaculated Neutrophils    nRBC 0 0 /100 WBC   Abs Immature Granulocytes 0.00 0.00 - 0.07 K/uL    Comment: Performed at [facility], [address], [place], [place] [address]  CBG monitoring, ED     Status: Abnormal   Collection Time: 09/06/21  1:07 PM  Result Value Ref Range   Glucose-Capillary >600 (HH) 70 - 99 mg/dL    Comment: Glucose reference range applies only to samples taken after fasting for at least 8 hours.   Comment 1 Notify RN    Comment 2 Document in Chart    DG Chest Portable 1 View  Result Date: 09/06/2021 CLINICAL DATA:  Reason for exam: AMS Patient non verbal throughout exam. Hx of diabetes. No known heart or lung surgeries. Per ed triage notes: "Pt bib [facility] with complaints of increased ams. Pt arrives tachypnic and only alert to name. EXAM: PORTABLE CHEST - 1 VIEW COMPARISON:  07/29/2021 FINDINGS: Relatively low lung volumes.  No focal infiltrate or overt edema. Heart size and mediastinal contours are within normal limits. No effusion.  No pneumothorax. Visualized bones unremarkable. IMPRESSION: Low volumes.  No acute cardiopulmonary disease. Electronically Signed   By: Corlis Leak M.D.   On: 09/06/2021 12:31    Pending Labs Unresulted Labs (From admission, onward)     Start     Ordered   09/06/21 1327  Brain natriuretic peptide  Once,   R       Question:  Specimen collection method  Answer:  IV Team=IV Team collect   09/06/21 1326   09/06/21 1319  Resp Panel by RT-PCR (Flu A&B, Covid) Nasopharyngeal Swab  (Tier 2 - Symptomatic/asymptomatic)  Once,   R        09/06/21 1318   09/06/21 1303  Urinalysis, Routine w reflex microscopic  Once,   R        09/06/21 1304   09/06/21 1256  Basic metabolic panel  (Diabetes Ketoacidosis (DKA))  STAT Now then every 4 hours ,   STAT     Question:  Specimen collection method  Answer:  IV Team=IV Team collect   09/06/21 1255   09/06/21 1218  hCG, quantitative, pregnancy  ONCE - STAT,   STAT       Question:   Specimen collection method  Answer:  IV Team=IV Team collect   09/06/21 1217   09/06/21 1131  Beta-hydroxybutyric acid  Once,   STAT        09/06/21 1131   09/06/21 1131  Lactic acid, plasma  Now then every 2 hours,   STAT      09/06/21 1131   09/06/21 1131  Culture, blood (routine x 2)  BLOOD CULTURE X 2,   STAT      09/06/21 1131   09/06/21 1130  Urine rapid drug screen (hosp performed)  ONCE - STAT,   STAT        09/06/21 1131            Vitals/Pain Today's Vitals   09/06/21 1244 09/06/21 1330 09/06/21 1345 09/06/21 1400  BP:  (!) 135/91    Pulse:   98 (!) 139  Resp:  (!)  34  (!) 42  Temp: (!) 93.8 F (34.3 C)     TempSrc: Rectal     SpO2:   93% 100%  PainSc:        Isolation Precautions No active isolations  Medications Medications  piperacillin-tazobactam (ZOSYN) IVPB 3.375 g (0 g Intravenous Hold 09/06/21 1319)  lactated ringers bolus 1,924 mL (has no administration in time range)  insulin regular, human (MYXREDLIN) 100 units/ 100 mL infusion (13 Units/hr Intravenous New Bag/Given 09/06/21 1317)  lactated ringers infusion (has no administration in time range)  dextrose 5 % in lactated ringers infusion (has no administration in time range)  dextrose 50 % solution 0-50 mL (has no administration in time range)  docusate sodium (COLACE) capsule 100 mg (has no administration in time range)  polyethylene glycol (MIRALAX / GLYCOLAX) packet 17 g (has no administration in time range)  ondansetron (ZOFRAN) injection 4 mg (has no administration in time range)  pantoprazole (PROTONIX) injection 40 mg (has no administration in time range)  lactated ringers bolus 1,000 mL (1,000 mLs Intravenous New Bag/Given 09/06/21 1203)  lactated ringers bolus 1,000 mL (0 mLs Intravenous Stopped 09/06/21 1310)  lactated ringers bolus 1,000 mL (1,000 mLs Intravenous New Bag/Given 09/06/21 1318)    Mobility walks High fall risk     R Recommendations: See Admitting Provider Note  Report  given to:   Additional Notes:

## 2021-09-06 NOTE — ED Triage Notes (Signed)
Pt bib [facility] with complaints of increased ams. Pt arrives tachypnic and only alert to name. Pt will not follow commands. Pt was seen for same yesterday and left AMA.  EMS vitals: 110SBP, 582CBG,40RR, 9ETCO2, 140HR

## 2021-09-06 NOTE — ED Notes (Signed)
Critical care paged to RN Ryan per his request

## 2021-09-06 NOTE — H&P (Addendum)
NAME:  Annette [name], MRN:  [id number], DOB:  Nov 14, 1990, LOS: 0 ADMISSION DATE:  09/06/2021, CONSULTATION DATE:  12/5 REFERRING MD:  Alvino Chapel, CHIEF COMPLAINT:  AMS, hemoptysis    History of Present Illness:  Annette [name] is a 30 yo female with uncontrolled T2DM, previous DVT, and recent submassive PE (Oct 2022) who presented with altered mental status and tachypnea. Patient was seen in the ED the day prior on 12/4 for hemoptysis/hematemesis and HR in the 140s. However, she left AMA at that time after only getting IVF, did not get labs or any imaging performed. She was A&O x 4 on 12/4 prior to leaving AMA. Today, on 12/5, the patient presented via EMS for worsening altered mental status. She was tachypneic on arrival and alert and oriented to self only and intermittently following commands. History obtained from chart review, as patient is unable to participate in interview. Initial CBG >600.   Pertinent  Medical History  T2DM, DVT RLE, submassive PE   Significant Hospital Events: Including procedures, antibiotic start and stop dates in addition to other pertinent events   12/5 admitted to ICU  Interim History / Subjective:  Patient alert and oriented to self only, unable to participate in interview. Intermittently able to follow commands.   Objective   Blood pressure 122/88, pulse (!) 128, temperature (!) 93.8 F (34.3 C), temperature source Rectal, resp. rate (!) 21, SpO2 96 %.       No intake or output data in the 24 hours ending 09/06/21 1250 There were no vitals filed for this visit.  Examination: General: ill appearing, somnolent.  HENT: [place], AT. Some dried blood noted on lips Lungs: Tachypneic, saturating well on room air. CTAB.  Cardiovascular: Tachycardic rate, regular rhythm. No murmurs Abdomen: Soft, nontender. +BS Extremities: Pretibial edema L>R Neuro: Alert and oriented to self   Resolved Hospital Problem list   N/A  Assessment & Plan:  DKA Uncontrolled  T2DM Most recent A1c 13.3. Home meds include lantus 30u daily and metformin 500 mg daily, although unsure if patient has been adherent with these meds. CBG >600 upon presentation to the ED and patient received 3 L IVF in the ED. Glucose 1,168 and anion gap 27 on CMP.  - beta hydroxybutyrate pending - UA pending - Endotool started, transition to subcutaneous insulin after anion gap closed x 2 and patient able to tolerate po intake - Continue IVF resuscitation: LR 125 cc/hr and 2 L LR bolus - Likely will need central line as IV access is limited  Acute encephalopathy likely 2/2 to DKA Patient alert and oriented to self. Presented with SIRS criteria: hypothermia 34.3C, tachycardia, RR>20, and hypotension although no source of infection identified. Started on zosyn in the ED.  - Blood cultures pending - UA pending  Anion gap metabolic acidosis 2/2 DKA Lactic acidosis VBG with pH 7.027, PCO2 25.9, bicarb 6.8. Lactic acid 4. Anion gap metabolic acidosis likely secondary to DKA and lactic acidemia. - IVF resuscitation as above - Trend lactic acid   AKI Cr 3.54 on admission. Likely in the setting of poor po intake and emesis.  - IVF as above - Continue to monitor renal function - Avoid nephrotoxic agents   Elevated LFTs AST/ALT 64/31, alk phos elevated to 185. T bili 3.5.  - Continue to monitor CMP - RUQ ultrasound  Hx of PE/DVT Most recent PE in October, although unsure if patient has been adherent with Xarelto since then. Patient initially presented with tachycardia, tachypnea, and hypotension, could  be secondary to DKA vs progression of PE. Hypotension improved with IVF, thus making DKA more likely than progression of PE.  - Troponin and BNP pending - Consider heparin gtt - Consider echo when patient is more stable  Hemoptysis Patient reportedly presented with hemoptysis/hematemesis, although has not had any episodes since arrival to the ED. Hb 15.3 on arrival, likely  hemoconcentrated, as baseline is around 12.6. No active signs of bleeding noted on evaluation.  - Continue to monitor CBC  Best Practice (right click and "Reselect all SmartList Selections" daily)   Diet/type: NPO DVT prophylaxis: other held in setting of hemoptysis GI prophylaxis: PPI Lines: N/A Foley:  N/A Code Status:  full code Last date of multidisciplinary goals of care discussion [N/A]  Dispo: Admit to ICU, 2M   Labs   CBC: Recent Labs  Lab 09/06/21 1207  HGB 17.3*  HCT 51.0*    Basic Metabolic Panel: Recent Labs  Lab 09/06/21 1207  NA 138  K 5.9*   GFR: CrCl cannot be calculated (Patient's most recent lab result is older than the maximum 21 days allowed.). No results for input(s): PROCALCITON, WBC, LATICACIDVEN in the last 168 hours.  Liver Function Tests: No results for input(s): AST, ALT, ALKPHOS, BILITOT, PROT, ALBUMIN in the last 168 hours. No results for input(s): LIPASE, AMYLASE in the last 168 hours. No results for input(s): AMMONIA in the last 168 hours.  ABG    Component Value Date/Time   HCO3 6.8 (L) 09/06/2021 1207   TCO2 8 (L) 09/06/2021 1207   ACIDBASEDEF 23.0 (H) 09/06/2021 1207   O2SAT 67.0 09/06/2021 1207     Coagulation Profile: No results for input(s): INR, PROTIME in the last 168 hours.  Cardiac Enzymes: No results for input(s): CKTOTAL, CKMB, CKMBINDEX, TROPONINI in the last 168 hours.  HbA1C: Hgb A1c MFr Bld  Date/Time Value Ref Range Status  07/29/2021 06:05 PM 13.3 (H) 4.8 - 5.6 % Final    Comment:    (NOTE) Pre diabetes:          5.7%-6.4%  Diabetes:              >6.4%  Glycemic control for   <7.0% adults with diabetes   03/11/2021 11:34 PM 14.3 (H) 4.8 - 5.6 % Final    Comment:    (NOTE)         Prediabetes: 5.7 - 6.4         Diabetes: >6.4         Glycemic control for adults with diabetes: <7.0     CBG: Recent Labs  Lab 09/06/21 1107  GLUCAP >600*    Review of Systems:   Unable to obtain ROS    Past Medical History:  She,  has a past medical history of Ankle fracture and Diabetes mellitus without complication ([place]).   Surgical History:  No past surgical history on file.   Social History:   reports that she has never smoked. She has never used smokeless tobacco. She reports that she does not currently use alcohol. She reports current drug use. Drug: Marijuana.   Family History:  Her family history includes Diabetes in her mother and another family member; Stroke in her maternal grandmother.   Allergies No Known Allergies   Home Medications  Prior to Admission medications   Medication Sig Start Date End Date Taking? Authorizing Provider  Accu-Chek Softclix Lancets lancets Use as directed. 07/30/21   Lenoria Chime, MD  Blood Glucose Monitoring Suppl (BLOOD GLUCOSE  MONITOR SYSTEM) w/Device KIT Use to test blood sugars up to 4 times daily as directed. 07/30/21   Lenoria Chime, MD  diphenhydramine-acetaminophen (TYLENOL PM) 25-500 MG TABS tablet Take 2 tablets by mouth at bedtime as needed (headache/pain/sleep/fever).    [provider]  glucose blood test strip Use to test blood sugars up to 4 times daily as directed. 07/30/21   Lenoria Chime, MD  insulin glargine (LANTUS) 100 UNIT/ML Solostar Pen Inject 30 Units into the skin daily. 08/01/21   Carollee Leitz, MD  Insulin Pen Needle 32G X 4 MM MISC Use to inject insulin as directed up to 4 times daily. 07/30/21   Lenoria Chime, MD  metFORMIN (GLUCOPHAGE XR) 500 MG 24 hr tablet Take 1 tablet (500 mg total) by mouth daily with breakfast. 08/04/21 08/04/22  Carollee Leitz, MD  RIVAROXABAN Alveda Reasons) VTE STARTER PACK (15 & 20 MG) Follow package directions: Take one 22m tablet by mouth twice a day. On day 22, switch to one 224mtablet once a day. Take with food. 07/30/21   PrLenoria ChimeMD     Critical care time: 45 minutes

## 2021-09-07 ENCOUNTER — Inpatient Hospital Stay (HOSPITAL_COMMUNITY): Payer: Self-pay

## 2021-09-07 DIAGNOSIS — E0811 Diabetes mellitus due to underlying condition with ketoacidosis with coma: Secondary | ICD-10-CM

## 2021-09-07 LAB — BLOOD CULTURE ID PANEL (REFLEXED) - BCID2

## 2021-09-07 LAB — COMPREHENSIVE METABOLIC PANEL
ALT: 31 U/L (ref 0–44)
AST: 64 U/L — ABNORMAL HIGH (ref 15–41)
Albumin: 2.9 g/dL — ABNORMAL LOW (ref 3.5–5.0)
Alkaline Phosphatase: 185 U/L — ABNORMAL HIGH (ref 38–126)
Anion gap: 27 — ABNORMAL HIGH (ref 5–15)
BUN: 38 mg/dL — ABNORMAL HIGH (ref 6–20)
CO2: 7 mmol/L — ABNORMAL LOW (ref 22–32)
Calcium: 9.9 mg/dL (ref 8.9–10.3)
Chloride: 100 mmol/L (ref 98–111)
Creatinine, Ser: 3.54 mg/dL — ABNORMAL HIGH (ref 0.44–1.00)
GFR, Estimated: 17 mL/min — ABNORMAL LOW (ref >60)
Glucose, Bld: 1168 mg/dL (ref 70–99)
Potassium: 5.9 mmol/L — ABNORMAL HIGH (ref 3.5–5.1)
Sodium: 134 mmol/L — ABNORMAL LOW (ref 135–145)
Total Bilirubin: 3.5 mg/dL — ABNORMAL HIGH (ref 0.3–1.2)
Total Protein: 9.2 g/dL — ABNORMAL HIGH (ref 6.5–8.1)

## 2021-09-07 LAB — GLUCOSE, CAPILLARY
Glucose-Capillary: 123 mg/dL — ABNORMAL HIGH (ref 70–99)
Glucose-Capillary: 129 mg/dL — ABNORMAL HIGH (ref 70–99)
Glucose-Capillary: 132 mg/dL — ABNORMAL HIGH (ref 70–99)
Glucose-Capillary: 147 mg/dL — ABNORMAL HIGH (ref 70–99)
Glucose-Capillary: 147 mg/dL — ABNORMAL HIGH (ref 70–99)
Glucose-Capillary: 148 mg/dL — ABNORMAL HIGH (ref 70–99)
Glucose-Capillary: 167 mg/dL — ABNORMAL HIGH (ref 70–99)
Glucose-Capillary: 170 mg/dL — ABNORMAL HIGH (ref 70–99)
Glucose-Capillary: 171 mg/dL — ABNORMAL HIGH (ref 70–99)
Glucose-Capillary: 173 mg/dL — ABNORMAL HIGH (ref 70–99)
Glucose-Capillary: 176 mg/dL — ABNORMAL HIGH (ref 70–99)
Glucose-Capillary: 176 mg/dL — ABNORMAL HIGH (ref 70–99)
Glucose-Capillary: 181 mg/dL — ABNORMAL HIGH (ref 70–99)
Glucose-Capillary: 182 mg/dL — ABNORMAL HIGH (ref 70–99)
Glucose-Capillary: 187 mg/dL — ABNORMAL HIGH (ref 70–99)
Glucose-Capillary: 188 mg/dL — ABNORMAL HIGH (ref 70–99)
Glucose-Capillary: 195 mg/dL — ABNORMAL HIGH (ref 70–99)
Glucose-Capillary: 199 mg/dL — ABNORMAL HIGH (ref 70–99)
Glucose-Capillary: 203 mg/dL — ABNORMAL HIGH (ref 70–99)
Glucose-Capillary: 287 mg/dL — ABNORMAL HIGH (ref 70–99)

## 2021-09-07 LAB — CBC
HCT: 37.2 % (ref 36.0–46.0)
Hemoglobin: 12.1 g/dL (ref 12.0–15.0)
MCH: 28 pg (ref 26.0–34.0)
MCHC: 32.5 g/dL (ref 30.0–36.0)
MCV: 86.1 fL (ref 80.0–100.0)
Platelets: 158 10*3/uL (ref 150–400)
RBC: 4.32 MIL/uL (ref 3.87–5.11)
RDW: 12.9 % (ref 11.5–15.5)
WBC: 13.4 10*3/uL — ABNORMAL HIGH (ref 4.0–10.5)
nRBC: 0.2 % (ref 0.0–0.2)

## 2021-09-07 LAB — BASIC METABOLIC PANEL
Anion gap: 10 (ref 5–15)
Anion gap: 19 — ABNORMAL HIGH (ref 5–15)
Anion gap: 10 (ref 5–15)
BUN: 40 mg/dL — ABNORMAL HIGH (ref 6–20)
BUN: 41 mg/dL — ABNORMAL HIGH (ref 6–20)
BUN: 45 mg/dL — ABNORMAL HIGH (ref 6–20)
CO2: 18 mmol/L — ABNORMAL LOW (ref 22–32)
CO2: 19 mmol/L — ABNORMAL LOW (ref 22–32)
CO2: 19 mmol/L — ABNORMAL LOW (ref 22–32)
Calcium: 9.6 mg/dL (ref 8.9–10.3)
Calcium: 9.3 mg/dL (ref 8.9–10.3)
Calcium: 9.6 mg/dL (ref 8.9–10.3)
Chloride: 123 mmol/L — ABNORMAL HIGH (ref 98–111)
Chloride: 111 mmol/L (ref 98–111)
Chloride: 124 mmol/L — ABNORMAL HIGH (ref 98–111)
Creatinine, Ser: 2.71 mg/dL — ABNORMAL HIGH (ref 0.44–1.00)
Creatinine, Ser: 3.05 mg/dL — ABNORMAL HIGH (ref 0.44–1.00)
Creatinine, Ser: 3.53 mg/dL — ABNORMAL HIGH (ref 0.44–1.00)
GFR, Estimated: 24 mL/min — ABNORMAL LOW (ref >60)
GFR, Estimated: 21 mL/min — ABNORMAL LOW (ref >60)
GFR, Estimated: 17 mL/min — ABNORMAL LOW (ref >60)
Glucose, Bld: 154 mg/dL — ABNORMAL HIGH (ref 70–99)
Glucose, Bld: 207 mg/dL — ABNORMAL HIGH (ref 70–99)
Glucose, Bld: 164 mg/dL — ABNORMAL HIGH (ref 70–99)
Potassium: 3.8 mmol/L (ref 3.5–5.1)
Potassium: 4.2 mmol/L (ref 3.5–5.1)
Potassium: 4.1 mmol/L (ref 3.5–5.1)
Sodium: 151 mmol/L — ABNORMAL HIGH (ref 135–145)
Sodium: 149 mmol/L — ABNORMAL HIGH (ref 135–145)
Sodium: 153 mmol/L — ABNORMAL HIGH (ref 135–145)

## 2021-09-07 LAB — HEPARIN LEVEL (UNFRACTIONATED): Heparin Unfractionated: <0.1 IU/mL — ABNORMAL LOW (ref 0.30–0.70)

## 2021-09-07 LAB — RAPID URINE DRUG SCREEN, HOSP PERFORMED
Amphetamines: NOT DETECTED
Barbiturates: NOT DETECTED
Benzodiazepines: NOT DETECTED
Cocaine: NOT DETECTED
Opiates: NOT DETECTED
Tetrahydrocannabinol: NOT DETECTED

## 2021-09-07 LAB — MAGNESIUM: Magnesium: 2.3 mg/dL (ref 1.7–2.4)

## 2021-09-07 LAB — APTT: aPTT: 37 seconds — ABNORMAL HIGH (ref 24–36)

## 2021-09-07 LAB — HEMOGLOBIN A1C
Hgb A1c MFr Bld: 12.6 % — ABNORMAL HIGH (ref 4.8–5.6)
Mean Plasma Glucose: 314.92 mg/dL

## 2021-09-07 LAB — BETA-HYDROXYBUTYRIC ACID: Beta-Hydroxybutyric Acid: 0.14 mmol/L (ref 0.05–0.27)

## 2021-09-07 MED ORDER — HEPARIN (PORCINE) 25000 UT/250ML-% IV SOLN
2000.0000 [IU]/h | INTRAVENOUS | Status: AC
Start: 2021-09-07 — End: 2021-09-13
  Administered 2021-09-07: 950 [IU]/h via INTRAVENOUS
  Administered 2021-09-08: 09:00:00 1350 [IU]/h via INTRAVENOUS
  Administered 2021-09-09: 22:00:00 1700 [IU]/h via INTRAVENOUS
  Administered 2021-09-09: 1400 [IU]/h via INTRAVENOUS
  Administered 2021-09-10 – 2021-09-13 (×7): 2000 [IU]/h via INTRAVENOUS
  Filled 2021-09-07 (×10): qty 250

## 2021-09-07 MED ORDER — METOPROLOL TARTRATE 5 MG/5ML IV SOLN
INTRAVENOUS | Status: AC
  Filled 2021-09-07: qty 5

## 2021-09-07 MED ORDER — METOPROLOL TARTRATE 5 MG/5ML IV SOLN
2.5000 mg | Freq: Once | INTRAVENOUS | Status: AC
  Administered 2021-09-07: 2.5 mg via INTRAVENOUS

## 2021-09-07 MED ORDER — SODIUM CHLORIDE 0.9 % IV SOLN
2.0000 g | INTRAVENOUS | Status: DC
  Administered 2021-09-07 – 2021-09-09 (×3): 2 g via INTRAVENOUS
  Filled 2021-09-07 (×3): qty 20

## 2021-09-07 MED ORDER — CHLORHEXIDINE GLUCONATE CLOTH 2 % EX PADS
6.0000 | MEDICATED_PAD | Freq: Every day | CUTANEOUS | Status: DC
  Administered 2021-09-07 – 2021-09-14 (×9): 6 via TOPICAL

## 2021-09-07 MED ORDER — ACETAMINOPHEN 650 MG RE SUPP: 650.0000 mg | Freq: Four times a day (QID) | RECTAL | Status: DC | PRN

## 2021-09-07 MED ORDER — SODIUM CHLORIDE 0.9% FLUSH
10.0000 mL | Freq: Two times a day (BID) | INTRAVENOUS | Status: DC
  Administered 2021-09-07 – 2021-09-12 (×9): 10 mL
  Administered 2021-09-13: 3 mL
  Administered 2021-09-14: 10 mL

## 2021-09-07 MED ORDER — ORAL CARE MOUTH RINSE
15.0000 mL | Freq: Two times a day (BID) | OROMUCOSAL | Status: DC
  Administered 2021-09-07 – 2021-09-10 (×6): 15 mL via OROMUCOSAL

## 2021-09-07 MED ORDER — INSULIN ASPART 100 UNIT/ML IJ SOLN
0.0000 [IU] | INTRAMUSCULAR | Status: DC
  Administered 2021-09-07: 8 [IU] via SUBCUTANEOUS
  Administered 2021-09-07: 3 [IU] via SUBCUTANEOUS
  Administered 2021-09-08: 5 [IU] via SUBCUTANEOUS
  Administered 2021-09-08: 8 [IU] via SUBCUTANEOUS

## 2021-09-07 MED ORDER — PLASMA-LYTE A IV SOLN: INTRAVENOUS | Status: DC

## 2021-09-07 MED ORDER — PLASMA-LYTE A IV SOLN
Freq: Once | INTRAVENOUS | Status: AC
  Administered 2021-09-07: 500 mL/h via INTRAVENOUS

## 2021-09-07 MED ORDER — SODIUM CHLORIDE 0.9% FLUSH: 10.0000 mL | INTRAVENOUS | Status: DC | PRN

## 2021-09-07 MED ORDER — INSULIN DETEMIR 100 UNIT/ML ~~LOC~~ SOLN
10.0000 [IU] | Freq: Two times a day (BID) | SUBCUTANEOUS | Status: DC
  Administered 2021-09-07 (×2): 10 [IU] via SUBCUTANEOUS
  Filled 2021-09-07 (×4): qty 0.1

## 2021-09-07 NOTE — Progress Notes (Addendum)
ANTICOAGULATION CONSULT NOTE  Pharmacy Consult:  Heparin Indication: pulmonary embolus and DVT  No Known Allergies  Patient Measurements: Weight: 87.3 kg (192 lb 7.4 oz) Heparin Dosing Weight:  78 kg  Vital Signs: Temp: 101.8 F (38.8 C) (12/06 1800) Temp Source: Axillary (12/06 1800) BP: 112/79 (12/06 1600) Pulse Rate: 129 (12/06 1600)  Labs: Recent Labs    09/06/21 1210 09/06/21 1656 09/06/21 2025 09/06/21 2131 09/07/21 0056 09/07/21 0400 09/07/21 0835 09/07/21 1753 09/07/21 1754  HGB 15.3*  --   --  14.3  --  12.1  --   --   --   HCT 51.7*  --   --  42.0  --  37.2  --   --   --   PLT 226  --   --   --   --  158  --   --   --   APTT  --   --   --   --   --   --   --   --  37*  HEPARINUNFRC  --   --   --   --   --   --   --  <0.10*  --   CREATININE 3.54* 2.88*   < >  --  2.71* 3.05* 3.53*  --   --   TROPONINIHS  --  21*  --   --   --   --   --   --   --    < > = values in this interval not displayed.     Estimated Creatinine Clearance: 26.4 mL/min (A) (by C-G formula based on SCr of 3.53 mg/dL (H)).   Assessment: 62 YOF presented on 12/5 with AMS, DKA and bacteremia.  Patient has a history of PE in October 2022 and DVT.  Unable to confirm whether she was compliant with Xarelto PTA.  She was seen in the ED on 12/4 for hemoptysis/hematemesis and left AMA.  No hemoptysis observed thus far this admission and CBC is stable.  Pharmacy consulted for IV heparin dosing.  Will be cautious with initial heparin dosing.  Initial heparin level is undetectable, aPTT is essentially wnl at 37 seconds. No bleeding issues so far on this shift per RN.  Goal of Therapy:  Heparin level 0.3-0.7 units/ml Monitor platelets by anticoagulation protocol: Yes   Plan:  Increase heparin to 1200 units/h Recheck heparin level in 8h Stop checking aPTTs   Fredonia Highland, PharmD, BCPS, [facility] Clinical Pharmacist Please check AMION for all [facility] Pharmacy numbers 09/07/2021

## 2021-09-07 NOTE — Progress Notes (Signed)
PHARMACY - PHYSICIAN COMMUNICATION CRITICAL VALUE ALERT - BLOOD CULTURE IDENTIFICATION (BCID)  Annette [name] is an 30 y.o. female who presented to [facility] on 09/06/2021 with a chief complaint of DKA  Assessment:   1/2 blood cultures growing E. Coli  Name of physician (or Provider) Contacted:  Dr. Warrick Parisian  Current antibiotics:  Zosyn  Changes to prescribed antibiotics recommended:  Consider narrowing to Rocephin 2 gIV q24 when appropriate  Results for orders placed or performed during the hospital encounter of 09/06/21  Blood Culture ID Panel (Reflexed) (Collected: 09/06/2021 12:42 PM)  Result Value Ref Range   Enterococcus faecalis NOT DETECTED NOT DETECTED   Enterococcus Faecium NOT DETECTED NOT DETECTED   Listeria monocytogenes NOT DETECTED NOT DETECTED   Staphylococcus species NOT DETECTED NOT DETECTED   Staphylococcus aureus (BCID) NOT DETECTED NOT DETECTED   Staphylococcus epidermidis NOT DETECTED NOT DETECTED   Staphylococcus lugdunensis NOT DETECTED NOT DETECTED   Streptococcus species NOT DETECTED NOT DETECTED   Streptococcus agalactiae NOT DETECTED NOT DETECTED   Streptococcus pneumoniae NOT DETECTED NOT DETECTED   Streptococcus pyogenes NOT DETECTED NOT DETECTED   A.calcoaceticus-baumannii NOT DETECTED NOT DETECTED   Bacteroides fragilis NOT DETECTED NOT DETECTED   Enterobacterales DETECTED (A) NOT DETECTED   Enterobacter cloacae complex NOT DETECTED NOT DETECTED   Escherichia coli DETECTED (A) NOT DETECTED   Klebsiella aerogenes NOT DETECTED NOT DETECTED   Klebsiella oxytoca NOT DETECTED NOT DETECTED   Klebsiella pneumoniae NOT DETECTED NOT DETECTED   Proteus species NOT DETECTED NOT DETECTED   Salmonella species NOT DETECTED NOT DETECTED   Serratia marcescens NOT DETECTED NOT DETECTED   Haemophilus influenzae NOT DETECTED NOT DETECTED   Neisseria meningitidis NOT DETECTED NOT DETECTED   Pseudomonas aeruginosa NOT DETECTED NOT DETECTED   Stenotrophomonas  maltophilia NOT DETECTED NOT DETECTED   Candida albicans NOT DETECTED NOT DETECTED   Candida auris NOT DETECTED NOT DETECTED   Candida glabrata NOT DETECTED NOT DETECTED   Candida krusei NOT DETECTED NOT DETECTED   Candida parapsilosis NOT DETECTED NOT DETECTED   Candida tropicalis NOT DETECTED NOT DETECTED   Cryptococcus neoformans/gattii NOT DETECTED NOT DETECTED   CTX-M ESBL NOT DETECTED NOT DETECTED   Carbapenem resistance IMP NOT DETECTED NOT DETECTED   Carbapenem resistance KPC NOT DETECTED NOT DETECTED   Carbapenem resistance NDM NOT DETECTED NOT DETECTED   Carbapenem resist OXA 48 LIKE NOT DETECTED NOT DETECTED   Carbapenem resistance VIM NOT DETECTED NOT DETECTED    [name] 09/07/2021  2:21 AM

## 2021-09-07 NOTE — Progress Notes (Addendum)
eLink Physician-Brief Progress Note Patient Name: Annette [name] DOB: Oct 28, 1990 MRN: [id number]   Date of Service  09/07/2021  HPI/Events of Note  Fever to 102.9 F - Already on Rocephin for E. Coli and Enterobacterales cystitis/bacteremia. AST elevated. Will avoid Tylenol.   eICU Interventions  Plan: Ice packs.     Intervention Category Major Interventions: Infection - evaluation and management  Sueo Cullen Eugene 09/07/2021, 8:11 PM

## 2021-09-07 NOTE — Progress Notes (Addendum)
Lab called 1330-2pm today, 12/6, to report a critical blood sugar of 1168 from 12/5 at 1210 pm.  The pt is on a glucomander and her GAP is now closed. We are managing her blood sugars as per protocol. The lab stated they were required to call even though it was over 24 hours later.

## 2021-09-07 NOTE — Progress Notes (Signed)
NAME:  Annette [name], MRN:  [id number], DOB:  1991-09-10, LOS: 1 ADMISSION DATE:  09/06/2021, CONSULTATION DATE:  12/5 REFERRING MD:  Alvino Chapel, CHIEF COMPLAINT:  AMS, hemoptysis    History of Present Illness:  Annette [name] is a 30 yo female with uncontrolled T2DM, previous DVT, and recent submassive PE (Oct 2022) who presented with altered mental status and tachypnea. Patient was seen in the ED the day prior on 12/4 for hemoptysis/hematemesis and HR in the 140s. However, she left AMA at that time after only getting IVF, did not get labs or any imaging performed. She was A&O x 4 on 12/4 prior to leaving AMA. Today, on 12/5, the patient presented via EMS for worsening altered mental status. She was tachypneic on arrival and alert and oriented to self only and intermittently following commands. History obtained from chart review, as patient is unable to participate in interview. Initial CBG >600.   Pertinent  Medical History  T2DM, DVT RLE, submassive PE (x2, first attributed to COVID, second unprovoked)  Significant Hospital Events: Including procedures, antibiotic start and stop dates in addition to other pertinent events   12/5 admitted to ICU for DKA, dehydration, AKI, poor access s/p R femoral CVL  Interim History / Subjective:   Remains on low dose precedex Tmax 101.9 overnight Bcs c/w e.coli bacteremia  No reports of emesis or bloody sputum  Parents at bedside, patient remains encephalopathic  Objective   Blood pressure (!) 82/58, pulse (!) 115, temperature 98.4 F (36.9 C), temperature source Axillary, resp. rate (!) 53, weight 87.3 kg, SpO2 99 %.        Intake/Output Summary (Last 24 hours) at 09/07/2021 2423 Last data filed at 09/07/2021 5361 Gross per 24 hour  Intake 3872.74 ml  Output 200 ml  Net 3672.74 ml   Filed Weights   09/07/21 0519  Weight: 87.3 kg    Examination: General:  ill appearing young female lying in bed in NAD HEENT: MM pink/lips dry, some dried  blood on lips, pupils 3/reactive Neuro:  arouses to verbal/ light touch, mostly remains non verbal, occasional incomprehensible sound, MAE CV:  ST, R femoral CVL site wnl PULM:  non labored, tachypneic, CTA GI: soft, +bs, ND/ NT, purwick Extremities: warm/dry, no LE edema  Skin: no rashes, several tattoos, nasal piercing   Resolved Hospital Problem list   N/A  Assessment & Plan:  DKA Uncontrolled T2DM Most recent A1c 13.3. Home meds include lantus 30u daily and metformin 500 mg daily, although unsure if patient has been adherent with these meds.  Initial glucose 1168 with AG 27. - beta hydroxybutyrate back to wnl - likely precipitated by UTI +/- poor medical compliance - transition off IV insulin/ endo tool to SQ    E. Coli bacteremia secondary to UTI Sepsis, POA - blood pressure softer on higher dose precedex, since normotensive - goal MAP > 65 - She was delayed getting abx initially.  Start ceftriaxone 2gm daily  - follow UC  - trend WBC/ fever curve   Acute metabolic encephalopathy likely 2/2 to DKA and sepsis  - supportive care as above - frequent neuro checks  - likely will improve as sepsis and metabolic derangements resolve  Anion gap metabolic acidosis 2/2 DKA- resolved Lactic acidosis AKI Hypernatremia Hyperchloremic metabolic acidosis  Cr 4.43 on admission. Likely in the setting of profound dehydration and urosepsis  - sCr had improved to 2.53 but now up to 3.53 - several unmeasured urinary occurrences, may need foley, monitor for  urinary retention while altered  - check renal ultrasound to rule out obstruction  - plasmalyte 531m now and then 125 ml/hr  - trend renal indices  - Avoid nephrotoxic agents   Elevated LFTs AST/ALT 64/31, alk phos elevated to 185. T bili 3.5.  - RUQ ultrasound 12/5 wnl - repeat LFTs in am   Hx of PE/DVT Most recent PE in October, although unsure if patient has been adherent with Xarelto since then.  - Troponin hs 21 and  negative BNP - remains on room air  - no signs of bleeding yet, H/H remains stable - restart heparin gtt per pharmacy, monitor closely - transition back to Xarelto prior to d/c and will need f/u with hematology for further testing [facility]  Hemoptysis No further evidence / episodes of hemoptysis/hematemesis.  - starting heparin gtt as above - monitor CBC/ bleeding closely  Best Practice (right click and "Reselect all SmartList Selections" daily)   Diet/type: NPO while encephalopathic  DVT prophylaxis: systemic heparin GI prophylaxis: PPI Lines: Central line- 12/5 R femoral CVL Foley:  N/A Code Status:  full code Last date of multidisciplinary goals of care discussion:  Full Code.  Parents, [name] father KLennette [phone] at bedside.  They report she lives alone and has been going through a bad breakup recently.    Labs   CBC: Recent Labs  Lab 09/06/21 1207 09/06/21 1210 09/06/21 2131 09/07/21 0400  WBC  --  21.2*  --  13.4*  NEUTROABS  --  18.9*  --   --   HGB 17.3* 15.3* 14.3 12.1  HCT 51.0* 51.7* 42.0 37.2  MCV  --  97.0  --  86.1  PLT  --  226  --  1606   Basic Metabolic Panel: Recent Labs  Lab 09/06/21 1656 09/06/21 2025 09/06/21 2131 09/07/21 0056 09/07/21 0400 09/07/21 0835  NA 146* 149* 155* 151* 149* 153*  K 2.6* 2.6* 3.4* 3.8 4.2 4.1  CL 113* 117*  --  123* 111 124*  CO2 8* 15*  --  18* 19* 19*  GLUCOSE 733* 418*  --  154* 207* 164*  BUN 38* 39*  --  40* 41* 45*  CREATININE 2.88* 2.53*  --  2.71* 3.05* 3.53*  CALCIUM 10.4* 10.2  --  9.6 9.3 9.6  MG  --   --   --   --   --  2.3   GFR: Estimated Creatinine Clearance: 26.4 mL/min (A) (by C-G formula based on SCr of 3.53 mg/dL (H)). Recent Labs  Lab 09/06/21 1131 09/06/21 1210 09/06/21 1331 09/07/21 0400  WBC  --  21.2*  --  13.4*  LATICACIDVEN 4.0*  --  4.0*  --     Liver Function Tests: Recent Labs  Lab 09/06/21 1210   AST 64*  ALT 31  ALKPHOS 185*  BILITOT 3.5*  PROT 9.2*  ALBUMIN 2.9*   No results for input(s): LIPASE, AMYLASE in the last 168 hours. No results for input(s): AMMONIA in the last 168 hours.  ABG    Component Value Date/Time   PHART 7.397 09/06/2021 2131   PCO2ART 24.0 (L) 09/06/2021 2131   PO2ART 99 09/06/2021 2131   HCO3 14.7 (L) 09/06/2021 2131   TCO2 15 (L) 09/06/2021 2131   ACIDBASEDEF 8.0 (H) 09/06/2021 2131   O2SAT 98.0 09/06/2021 2131     Coagulation Profile: No results for input(s): INR, PROTIME in the last 168  hours.  Cardiac Enzymes: No results for input(s): CKTOTAL, CKMB, CKMBINDEX, TROPONINI in the last 168 hours.  HbA1C: Hgb A1c MFr Bld  Date/Time Value Ref Range Status  09/07/2021 04:00 AM 12.6 (H) 4.8 - 5.6 % Final    Comment:    (NOTE) Pre diabetes:          5.7%-6.4%  Diabetes:              >6.4%  Glycemic control for   <7.0% adults with diabetes   07/29/2021 06:05 PM 13.3 (H) 4.8 - 5.6 % Final    Comment:    (NOTE) Pre diabetes:          5.7%-6.4%  Diabetes:              >6.4%  Glycemic control for   <7.0% adults with diabetes     CBG: Recent Labs  Lab 09/07/21 0522 09/07/21 0620 09/07/21 0721 09/07/21 0731 09/07/21 0828  GLUCAP 173* 188* 171* 167* 147*    Critical care time: 40 minutes    Kennieth Rad, ACNP [facility] 09/07/2021, 9:24 AM  See Shea Evans for pager If no response to pager, please call PCCM consult pager After 7:00 pm call Elink

## 2021-09-07 NOTE — Progress Notes (Signed)
ANTICOAGULATION CONSULT NOTE - Initial Consult  Pharmacy Consult:  Heparin Indication: pulmonary embolus and DVT  No Known Allergies  Patient Measurements: Weight: 87.3 kg (192 lb 7.4 oz) Heparin Dosing Weight:  78 kg  Vital Signs: Temp: 98.4 F (36.9 C) (12/06 0723) Temp Source: Axillary (12/06 0723) BP: 82/58 (12/06 0600) Pulse Rate: 115 (12/06 0600)  Labs: Recent Labs    09/06/21 1210 09/06/21 1656 09/06/21 2025 09/06/21 2131 09/07/21 0056 09/07/21 0400 09/07/21 0835  HGB 15.3*  --   --  14.3  --  12.1  --   HCT 51.7*  --   --  42.0  --  37.2  --   PLT 226  --   --   --   --  158  --   CREATININE 3.54* 2.88*   < >  --  2.71* 3.05* 3.53*  TROPONINIHS  --  21*  --   --   --   --   --    < > = values in this interval not displayed.    Estimated Creatinine Clearance: 26.4 mL/min (A) (by C-G formula based on SCr of 3.53 mg/dL (H)).   Assessment: 32 YOF presented on 12/5 with AMS, DKA and bacteremia.  Patient has a history of PE in October 2022 and DVT.  Unable to confirm whether she was compliant with Xarelto PTA.  She was seen in the ED on 12/4 for hemoptysis/hematemesis and left AMA.  No hemoptysis observed thus far this admission and CBC is stable.  Pharmacy consulted for IV heparin dosing.  Will be cautious with initial heparin dosing.  Goal of Therapy:  Heparin level 0.3-0.7 units/ml Monitor platelets by anticoagulation protocol: Yes   Plan:  Initiate heparin gtt at 950 units/hr, no bolus with recent hemoptysis Check 6 hr aPTT and heparin level Daily heparin level, aPTT and CBC Monitor closely for s/sx of bleeding  Feliza Diven D. Laney Potash, PharmD, BCPS, BCCCP 09/07/2021, 10:05 AM

## 2021-09-08 LAB — GLUCOSE, CAPILLARY
Glucose-Capillary: 264 mg/dL — ABNORMAL HIGH (ref 70–99)
Glucose-Capillary: 234 mg/dL — ABNORMAL HIGH (ref 70–99)
Glucose-Capillary: 205 mg/dL — ABNORMAL HIGH (ref 70–99)
Glucose-Capillary: 161 mg/dL — ABNORMAL HIGH (ref 70–99)
Glucose-Capillary: 162 mg/dL — ABNORMAL HIGH (ref 70–99)
Glucose-Capillary: 201 mg/dL — ABNORMAL HIGH (ref 70–99)

## 2021-09-08 LAB — BASIC METABOLIC PANEL
Anion gap: 12 (ref 5–15)
Anion gap: 15 (ref 5–15)
BUN: 61 mg/dL — ABNORMAL HIGH (ref 6–20)
BUN: 69 mg/dL — ABNORMAL HIGH (ref 6–20)
CO2: 18 mmol/L — ABNORMAL LOW (ref 22–32)
CO2: 17 mmol/L — ABNORMAL LOW (ref 22–32)
Calcium: 8.5 mg/dL — ABNORMAL LOW (ref 8.9–10.3)
Calcium: 8.6 mg/dL — ABNORMAL LOW (ref 8.9–10.3)
Chloride: 121 mmol/L — ABNORMAL HIGH (ref 98–111)
Chloride: 121 mmol/L — ABNORMAL HIGH (ref 98–111)
Creatinine, Ser: 6.22 mg/dL — ABNORMAL HIGH (ref 0.44–1.00)
Creatinine, Ser: 6.69 mg/dL — ABNORMAL HIGH (ref 0.44–1.00)
GFR, Estimated: 9 mL/min — ABNORMAL LOW (ref >60)
GFR, Estimated: 8 mL/min — ABNORMAL LOW (ref >60)
Glucose, Bld: 310 mg/dL — ABNORMAL HIGH (ref 70–99)
Glucose, Bld: 175 mg/dL — ABNORMAL HIGH (ref 70–99)
Potassium: 4.6 mmol/L (ref 3.5–5.1)
Potassium: 4.4 mmol/L (ref 3.5–5.1)
Sodium: 151 mmol/L — ABNORMAL HIGH (ref 135–145)
Sodium: 153 mmol/L — ABNORMAL HIGH (ref 135–145)

## 2021-09-08 LAB — HCG, QUANTITATIVE, PREGNANCY: hCG, Beta Chain, Quant, S: 1 m[IU]/mL (ref <5)

## 2021-09-08 LAB — URINALYSIS, COMPLETE (UACMP) WITH MICROSCOPIC
Bilirubin Urine: NEGATIVE
Glucose, UA: 100 mg/dL — AB
Ketones, ur: NEGATIVE mg/dL
Nitrite: NEGATIVE
Protein, ur: 100 mg/dL — AB
RBC / HPF: >50 RBC/hpf (ref 0–5)
Specific Gravity, Urine: 1.01 (ref 1.005–1.030)
pH: 6 (ref 5.0–8.0)

## 2021-09-08 LAB — CBC
HCT: 32.7 % — ABNORMAL LOW (ref 36.0–46.0)
Hemoglobin: 11.3 g/dL — ABNORMAL LOW (ref 12.0–15.0)
MCH: 28.8 pg (ref 26.0–34.0)
MCHC: 34.6 g/dL (ref 30.0–36.0)
MCV: 83.2 fL (ref 80.0–100.0)
Platelets: UNDETERMINED 10*3/uL (ref 150–400)
RBC: 3.93 MIL/uL (ref 3.87–5.11)
RDW: 13.4 % (ref 11.5–15.5)
WBC: 11.5 10*3/uL — ABNORMAL HIGH (ref 4.0–10.5)
nRBC: 0 % (ref 0.0–0.2)

## 2021-09-08 LAB — HEPATIC FUNCTION PANEL
ALT: 28 U/L (ref 0–44)
AST: 23 U/L (ref 15–41)
Albumin: 1.6 g/dL — ABNORMAL LOW (ref 3.5–5.0)
Alkaline Phosphatase: 113 U/L (ref 38–126)
Bilirubin, Direct: <0.1 mg/dL (ref 0.0–0.2)
Total Bilirubin: 0.4 mg/dL (ref 0.3–1.2)
Total Protein: 5.9 g/dL — ABNORMAL LOW (ref 6.5–8.1)

## 2021-09-08 LAB — CREATININE, URINE, RANDOM: Creatinine, Urine: 55.39 mg/dL

## 2021-09-08 LAB — CK: Total CK: 65 U/L (ref 38–234)

## 2021-09-08 LAB — HEPARIN LEVEL (UNFRACTIONATED)
Heparin Unfractionated: 0.19 IU/mL — ABNORMAL LOW (ref 0.30–0.70)
Heparin Unfractionated: 0.31 IU/mL (ref 0.30–0.70)

## 2021-09-08 LAB — SODIUM, URINE, RANDOM: Sodium, Ur: 108 mmol/L

## 2021-09-08 MED ORDER — INSULIN ASPART 100 UNIT/ML IJ SOLN
0.0000 [IU] | INTRAMUSCULAR | Status: DC
  Administered 2021-09-08 (×2): 7 [IU] via SUBCUTANEOUS
  Administered 2021-09-08 (×2): 4 [IU] via SUBCUTANEOUS
  Administered 2021-09-09: 16:00:00 7 [IU] via SUBCUTANEOUS
  Administered 2021-09-09: 20:00:00 4 [IU] via SUBCUTANEOUS
  Administered 2021-09-09: 09:00:00 3 [IU] via SUBCUTANEOUS
  Administered 2021-09-09 – 2021-09-10 (×2): 4 [IU] via SUBCUTANEOUS
  Administered 2021-09-10: 20:00:00 11 [IU] via SUBCUTANEOUS
  Administered 2021-09-10 (×2): 7 [IU] via SUBCUTANEOUS
  Administered 2021-09-11 (×2): 4 [IU] via SUBCUTANEOUS
  Administered 2021-09-11: 3 [IU] via SUBCUTANEOUS
  Administered 2021-09-11 – 2021-09-12 (×2): 4 [IU] via SUBCUTANEOUS
  Administered 2021-09-12: 3 [IU] via SUBCUTANEOUS
  Administered 2021-09-12: 4 [IU] via SUBCUTANEOUS

## 2021-09-08 MED ORDER — INSULIN DETEMIR 100 UNIT/ML ~~LOC~~ SOLN
15.0000 [IU] | Freq: Two times a day (BID) | SUBCUTANEOUS | Status: DC
  Administered 2021-09-08 – 2021-09-14 (×13): 15 [IU] via SUBCUTANEOUS
  Filled 2021-09-08 (×15): qty 0.15

## 2021-09-08 MED ORDER — SODIUM CHLORIDE 0.45 % IV SOLN: INTRAVENOUS | Status: DC

## 2021-09-08 NOTE — Plan of Care (Signed)

## 2021-09-08 NOTE — Progress Notes (Signed)
NAME:  Annette [name], MRN:  [id number], DOB:  11/13/1990, LOS: 2 ADMISSION DATE:  09/06/2021, CONSULTATION DATE:  12/5 REFERRING MD:  Alvino Chapel, CHIEF COMPLAINT:  AMS, hemoptysis    History of Present Illness:  Annette [name] is a 30 yo female with uncontrolled T2DM, previous DVT, and recent submassive PE (Oct 2022) who presented with altered mental status and tachypnea. Patient was seen in the ED the day prior on 12/4 for hemoptysis/hematemesis and HR in the 140s. However, she left AMA at that time after only getting IVF, did not get labs or any imaging performed. She was A&O x 4 on 12/4 prior to leaving AMA. Today, on 12/5, the patient presented via EMS for worsening altered mental status. She was tachypneic on arrival and alert and oriented to self only and intermittently following commands. History obtained from chart review, as patient is unable to participate in interview. Initial CBG >600.   Pertinent  Medical History  T2DM, DVT RLE, submassive PE (x2, first attributed to COVID, second unprovoked)  Significant Hospital Events: Including procedures, antibiotic start and stop dates in addition to other pertinent events   12/5 admitted to ICU for DKA, dehydration, AKI, poor access s/p R femoral CVL, +UTI/ Bcx + e.coli 12/6 off insulin gtt, still encephalopathic, rocephin  Interim History / Subjective:   Remains on low dose precedex 0.2 More awake today and intermittently verbal  Tmax 102.9 overnight, WBC 12.1 > 11.3 No reports of hematemesis or hemoptysis  BG trending up UOP 657m/ 24hr, sediment in foley> have been flushing prn for patency.  Flushed this morning, patent, little sediment.  sCr 3.53 > 6.22  Objective   Blood pressure (!) 133/96, pulse (!) 121, temperature 98.7 F (37.1 C), temperature source Oral, resp. rate (!) 43, weight 89.1 kg, SpO2 98 %.        Intake/Output Summary (Last 24 hours) at 09/08/2021 0840 Last data filed at 09/08/2021 0800 Gross per 24 hour  Intake  3274.22 ml  Output 670 ml  Net 2604.22 ml   Filed Weights   09/07/21 0519 09/08/21 0453  Weight: 87.3 kg 89.1 kg    Examination: precedex at 0.2 > turned off General:  ill appearing young female sitting upright in bed in NAD HEENT: MM pink/moist, pupils 3/reactive Neuro:  slow to wake up but then would follow simple commands, verbalizes to name and location, MAE CV: ST 120's, no murmur PULM:  non labored, intermittently tachypneic at times, still on room air, strong cough, clear and diminished GI: soft, bs+, NT, foley- dark urine, some sediment in chamber- flushed with clear output Extremities: warm/dry, no LE edema  Skin: no rashes   Resolved Hospital Problem list   N/A  Assessment & Plan:  DKA Uncontrolled T2DM Most recent A1c 13.3. Home meds include lantus 30u daily and metformin 500 mg daily, although unsure if patient has been adherent with these meds.  Initial glucose 1168 with AG 27.  Likely precipitated by UTI/ bacteremia  - off insulin gtt 12/6 -  increasing BG without AG.  Increase levemir 10 > 15 units BID.  Ideally would increase to 20 units BID but given jump in sCr will hold off and increase SSI to resistant.  Remains NPO for now as well.    E. Coli bacteremia secondary to UTI Sepsis, POA - remains normotensive.  Has not required pressors.  - continue ceftriaxone - follow UC  - trending fever curve and WBC (down trending) - still needing CVL given poor peripheral  access.    Acute metabolic encephalopathy likely 2/2 to DKA and sepsis  - supportive care as above, correcting metabolic derangements  - frequent neuro checks, remains non focal - starting to improve, more awake today and starting to talk/ interact  Anion gap metabolic acidosis 2/2 DKA- resolved Lactic acidosis AKI, oliguric  Hypernatremia Hyperchloremic metabolic acidosis  Cr 4.76 on admission. Likely in the setting of profound dehydration and urosepsis  - sCr had improved to 2.53 > then 3.53 >  up today to 6.22.  - AKI initially thought to be pre-renal due to poor PO intake, hypotension (although never requiring pressors), and severe dehydration in the setting of severe DKA.  However, foley had to be placed 12/6 due to concerns of urinary retention would could be contributing as well as being hyperchloremic.  Noted to have a lot of sediment post foley insertion requiring flushing to maintain patency. Fluids changed 12/6 to hydrate as well as she has remained NPO due to encephalopathy but this is better today, hopefully can avoid cortrak but this may be needed.  Suspected to be progressing to ATN.  Will send urine lytes.  Renal ultrasound negative 12/6.  Nephrology consulted, greatly appreciate input - continue foley, flushing prn/ monitoring for sediment - trend renal indices  - Avoid nephrotoxic agents   Elevated LFTs AST/ALT 64/31, alk phos elevated to 185. T bili 3.5.  - RUQ ultrasound 12/5 wnl - repeat LFTs wnl 12/7  Hx of PE/DVT Most recent PE in October, although unsure if patient has been adherent with Xarelto since then.  - continue heparin gtt for now per pharmacy.  No evidence of bleeding.  Monitor H/H closely.  12.1 > 11.3 today  - transition back to Xarelto prior to d/c and will need f/u with hematology for further testing and likely lifelong [facility] given this is her second PE  Hemoptysis No further evidence / episodes of hemoptysis/hematemesis.  - monitor CBC/ bleeding closely   At risk for malnutrition - waking up today, hopeful to start some PO's, if not will need to consider cortrak  Best Practice (right click and "Reselect all SmartList Selections" daily)   Diet/type: NPO  DVT prophylaxis: systemic heparin GI prophylaxis: PPI Lines: Central line- 12/5 R femoral CVL Foley:  Yes, and it is still needed Code Status:  full code Last date of multidisciplinary goals of care discussion:  Full Code.  Parents, Anderson Malta [phone] and father Lennette Bihari [phone].   Mother  updated at bedside 12/7.    Labs   CBC: Recent Labs  Lab 09/06/21 1207 09/06/21 1210 09/06/21 2131 09/07/21 0400 09/08/21 0445  WBC  --  21.2*  --  13.4* 11.5*  NEUTROABS  --  18.9*  --   --   --   HGB 17.3* 15.3* 14.3 12.1 11.3*  HCT 51.0* 51.7* 42.0 37.2 32.7*  MCV  --  97.0  --  86.1 83.2  PLT  --  226  --  158 PLATELET CLUMPS NOTED ON SMEAR, UNABLE TO ESTIMATE    Basic Metabolic Panel: Recent Labs  Lab 09/06/21 2025 09/06/21 2131 09/07/21 0056 09/07/21 0400 09/07/21 0835 09/08/21 0445  NA 149* 155* 151* 149* 153* 151*  K 2.6* 3.4* 3.8 4.2 4.1 4.6  CL 117*  --  123* 111 124* 121*  CO2 15*  --  18* 19* 19* 18*  GLUCOSE 418*  --  154* 207* 164* 310*  BUN 39*  --  40* 41* 45* 61*  CREATININE 2.53*  --  2.71* 3.05* 3.53* 6.22*  CALCIUM 10.2  --  9.6 9.3 9.6 8.5*  MG  --   --   --   --  2.3  --    GFR: Estimated Creatinine Clearance: 15.1 mL/min (A) (by C-G formula based on SCr of 6.22 mg/dL (H)). Recent Labs  Lab 09/06/21 1131 09/06/21 1210 09/06/21 1331 09/07/21 0400 09/08/21 0445  WBC  --  21.2*  --  13.4* 11.5*  LATICACIDVEN 4.0*  --  4.0*  --   --     Liver Function Tests: Recent Labs  Lab 09/06/21 1210 09/08/21 0445  AST 64* 23  ALT 31 28  ALKPHOS 185* 113  BILITOT 3.5* 0.4  PROT 9.2* 5.9*  ALBUMIN 2.9* 1.6*   No results for input(s): LIPASE, AMYLASE in the last 168 hours. No results for input(s): AMMONIA in the last 168 hours.  ABG    Component Value Date/Time   PHART 7.397 09/06/2021 2131   PCO2ART 24.0 (L) 09/06/2021 2131   PO2ART 99 09/06/2021 2131   HCO3 14.7 (L) 09/06/2021 2131   TCO2 15 (L) 09/06/2021 2131   ACIDBASEDEF 8.0 (H) 09/06/2021 2131   O2SAT 98.0 09/06/2021 2131     Coagulation Profile: No results for input(s): INR, PROTIME in the last 168 hours.  Cardiac Enzymes: No results for input(s): CKTOTAL, CKMB, CKMBINDEX, TROPONINI in the last 168 hours.  HbA1C: Hgb A1c MFr Bld  Date/Time Value Ref Range Status   09/07/2021 04:00 AM 12.6 (H) 4.8 - 5.6 % Final    Comment:    (NOTE) Pre diabetes:          5.7%-6.4%  Diabetes:              >6.4%  Glycemic control for   <7.0% adults with diabetes   07/29/2021 06:05 PM 13.3 (H) 4.8 - 5.6 % Final    Comment:    (NOTE) Pre diabetes:          5.7%-6.4%  Diabetes:              >6.4%  Glycemic control for   <7.0% adults with diabetes     CBG: Recent Labs  Lab 09/07/21 1846 09/07/21 1948 09/07/21 2325 09/08/21 0441 09/08/21 0743  GLUCAP 129* 176* 287* 264* 234*    Critical care time: 38 minutes    Kennieth Rad, ACNP [facility] 09/08/2021, 8:40 AM  See Amion for pager If no response to pager, please call PCCM consult pager After 7:00 pm call Elink

## 2021-09-08 NOTE — Progress Notes (Deleted)
ANTICOAGULATION CONSULT NOTE  Pharmacy Consult:  Heparin Indication: pulmonary embolus and DVT  No Known Allergies  Patient Measurements: Weight: 89.1 kg (196 lb 6.9 oz) Heparin Dosing Weight:  78 kg  Vital Signs: Temp: 100.7 F (38.2 C) (12/07 1200) Temp Source: Axillary (12/07 1200) BP: 140/93 (12/07 1400) Pulse Rate: 129 (12/07 1400)  Labs: Recent Labs    09/06/21 1210 09/06/21 1656 09/06/21 2025 09/06/21 2131 09/07/21 0056 09/07/21 0400 09/07/21 0835 09/07/21 1753 09/07/21 1754 09/08/21 0445 09/08/21 1402  HGB 15.3*  --   --  14.3  --  12.1  --   --   --  11.3*  --   HCT 51.7*  --   --  42.0  --  37.2  --   --   --  32.7*  --   PLT 226  --   --   --   --  158  --   --   --  PLATELET CLUMPS NOTED ON SMEAR, UNABLE TO ESTIMATE  --   APTT  --   --   --   --   --   --   --   --  37*  --   --   HEPARINUNFRC  --   --   --   --   --   --   --  <0.10*  --  0.19* 0.31  CREATININE 3.54* 2.88*   < >  --    < > 3.05* 3.53*  --   --  6.22*  --   TROPONINIHS  --  21*  --   --   --   --   --   --   --   --   --    < > = values in this interval not displayed.     Estimated Creatinine Clearance: 15.1 mL/min (A) (by C-G formula based on SCr of 6.22 mg/dL (H)).   Assessment: 54 YOF presented on 12/5 with AMS, DKA and bacteremia.  Patient has a history of PE in October 2022 and DVT.  She was seen in the ED on 12/4 for hemoptysis/hematemesis and left AMA.  No hemoptysis observed thus far this admission and CBC is stable.  Pharmacy consulted for IV heparin dosing.    Heparin level therapeutic and low normal; no issue with infusion.  Goal of Therapy:  Heparin level 0.3-0.7 units/ml Monitor platelets by anticoagulation protocol: Yes   Plan:  Increase heparin gtt slightly to 1400 units/hr, no bolus with recent hemoptysis Daily heparin level and CBC Monitor closely for s/sx of bleeding  Lanisa Ishler D. Laney Potash, PharmD, BCPS, BCCCP 09/08/2021, 3:00 PM   Chelsea Aus D. Laney Potash, PharmD, BCPS,  BCCCP 09/08/2021, 2:59 PM

## 2021-09-08 NOTE — Progress Notes (Signed)
ANTICOAGULATION CONSULT NOTE  Pharmacy Consult:  Heparin Indication: pulmonary embolus and DVT  No Known Allergies  Patient Measurements: Weight: 89.1 kg (196 lb 6.9 oz) Heparin Dosing Weight:  78 kg  Vital Signs: Temp: 100.7 F (38.2 C) (12/07 1200) Temp Source: Axillary (12/07 1200) BP: 140/93 (12/07 1400) Pulse Rate: 129 (12/07 1400)  Labs: Recent Labs    09/06/21 1210 09/06/21 1656 09/06/21 2025 09/06/21 2131 09/07/21 0056 09/07/21 0400 09/07/21 0835 09/07/21 1753 09/07/21 1754 09/08/21 0445 09/08/21 1402  HGB 15.3*  --   --  14.3  --  12.1  --   --   --  11.3*  --   HCT 51.7*  --   --  42.0  --  37.2  --   --   --  32.7*  --   PLT 226  --   --   --   --  158  --   --   --  PLATELET CLUMPS NOTED ON SMEAR, UNABLE TO ESTIMATE  --   APTT  --   --   --   --   --   --   --   --  37*  --   --   HEPARINUNFRC  --   --   --   --   --   --   --  <0.10*  --  0.19* 0.31  CREATININE 3.54* 2.88*   < >  --    < > 3.05* 3.53*  --   --  6.22*  --   TROPONINIHS  --  21*  --   --   --   --   --   --   --   --   --    < > = values in this interval not displayed.     Estimated Creatinine Clearance: 15.1 mL/min (A) (by C-G formula based on SCr of 6.22 mg/dL (H)).   Assessment: 17 YOF presented on 12/5 with AMS, DKA and bacteremia.  Patient has a history of PE in October 2022 and DVT.  She was seen in the ED on 12/4 for hemoptysis/hematemesis and left AMA.  No hemoptysis observed thus far this admission and CBC is stable.  Pharmacy consulted for IV heparin dosing.    Heparin level therapeutic and low normal; no issue with infusion.  Goal of Therapy:  Heparin level 0.3-0.7 units/ml Monitor platelets by anticoagulation protocol: Yes   Plan:  Increase heparin gtt slightly to 1400 units/hr, no bolus with recent hemoptysis Daily heparin level and CBC Monitor closely for s/sx of bleeding  Hanae Waiters D. Laney Potash, PharmD, BCPS, BCCCP 09/08/2021, 3:02 PM

## 2021-09-08 NOTE — Progress Notes (Signed)
ANTICOAGULATION CONSULT NOTE   Pharmacy Consult:  Heparin Indication: pulmonary embolus and DVT  No Known Allergies  Patient Measurements: Weight: 89.1 kg (196 lb 6.9 oz) Heparin Dosing Weight:  78 kg  Vital Signs: Temp: 99.2 F (37.3 C) (12/07 0400) Temp Source: Oral (12/07 0400) BP: 136/97 (12/07 0400) Pulse Rate: 132 (12/07 0400)  Labs: Recent Labs    09/06/21 1210 09/06/21 1656 09/06/21 2025 09/06/21 2131 09/07/21 0056 09/07/21 0400 09/07/21 0835 09/07/21 1753 09/07/21 1754 09/08/21 0445  HGB 15.3*  --   --  14.3  --  12.1  --   --   --  11.3*  HCT 51.7*  --   --  42.0  --  37.2  --   --   --  32.7*  PLT 226  --   --   --   --  158  --   --   --  PLATELET CLUMPS NOTED ON SMEAR, UNABLE TO ESTIMATE  APTT  --   --   --   --   --   --   --   --  37*  --   HEPARINUNFRC  --   --   --   --   --   --   --  <0.10*  --  0.19*  CREATININE 3.54* 2.88*   < >  --    < > 3.05* 3.53*  --   --  6.22*  TROPONINIHS  --  21*  --   --   --   --   --   --   --   --    < > = values in this interval not displayed.     Estimated Creatinine Clearance: 15.1 mL/min (A) (by C-G formula based on SCr of 6.22 mg/dL (H)).   Assessment: 58 YOF presented on 12/5 with AMS, DKA and bacteremia.  Patient has a history of PE in October 2022 and DVT.  Unable to confirm whether she was compliant with Xarelto PTA.  She was seen in the ED on 12/4 for hemoptysis/hematemesis and left AMA.  No hemoptysis observed thus far this admission and CBC is stable.  Pharmacy consulted for IV heparin dosing.  Will be cautious with initial heparin dosing.  12/7 AM update:  Heparin level low but trending up  Goal of Therapy:  Heparin level 0.3-0.7 units/ml Monitor platelets by anticoagulation protocol: Yes   Plan:  Inc heparin to 1350 units/hr 1400 heparin level  Abran Duke, PharmD, BCPS Clinical Pharmacist Phone: [phone]

## 2021-09-08 NOTE — Plan of Care (Signed)
  Problem: Safety: Goal: Non-violent Restraint(s) 09/08/2021 1413 by Rocky Morel, RN Outcome: Completed/Met 09/08/2021 0911 by Rocky Morel, RN Outcome: Not Progressing

## 2021-09-08 NOTE — Consult Note (Signed)
[facility] Nephrology Consultation Note  Requesting MD: Dr. Ruthann Cancer Reason for consult: Acute renal failure  HPI:  Annette [name] is a 30 y.o. female with uncontrolled type II diabetes mellitus, previous DVT, recent submassive pulmonary embolism who presented to ED  on 12/4 for hemoptysis/hematemesis, left AMA, and returned on 12/5 and found to be in diabetic ketoacidosis and with acute metabolic encephalopathy in addition to E. coli bacteremia. Since admission, patient has been transitioned off insulin drip and encephalopathy has mildly improved. Patient has received antibiotics for E. coli bacteremia. Yesterday, 12/6 Foley catheter was placed due to concerns for urinary retention. Today renal function markedly worsened from previous days and nephrology consulted for further management.   Creatinine, Ser  Date/Time Value Ref Range Status  09/08/2021 04:45 AM 6.22 (H) 0.44 - 1.00 mg/dL Final  09/07/2021 08:35 AM 3.53 (H) 0.44 - 1.00 mg/dL Final  09/07/2021 04:00 AM 3.05 (H) 0.44 - 1.00 mg/dL Final  09/07/2021 12:56 AM 2.71 (H) 0.44 - 1.00 mg/dL Final  09/06/2021 08:25 PM 2.53 (H) 0.44 - 1.00 mg/dL Final  09/06/2021 04:56 PM 2.88 (H) 0.44 - 1.00 mg/dL Final  09/06/2021 12:10 PM 3.54 (H) 0.44 - 1.00 mg/dL Final  09/06/2021 11:31 AM 3.46 (H) 0.44 - 1.00 mg/dL Final  08/01/2021 01:21 AM 0.76 0.44 - 1.00 mg/dL Final  07/31/2021 06:00 AM 0.75 0.44 - 1.00 mg/dL Final  07/30/2021 12:16 PM 0.81 0.44 - 1.00 mg/dL Final  07/30/2021 08:57 AM 0.69 0.44 - 1.00 mg/dL Final  07/29/2021 10:29 AM 0.76 0.44 - 1.00 mg/dL Final  05/19/2021 01:45 PM 0.71 0.44 - 1.00 mg/dL Final  03/12/2021 01:46 AM 0.65 0.44 - 1.00 mg/dL Final  03/11/2021 04:35 PM 0.67 0.44 - 1.00 mg/dL Final  12/23/2020 02:00 PM 0.64 0.44 - 1.00 mg/dL Final  09/17/2019 06:00 AM 0.70 0.44 - 1.00 mg/dL Final  09/16/2019 06:12 PM 0.65 0.44 - 1.00 mg/dL Final   PMHx:   Past Medical History:  Diagnosis Date   Ankle fracture     Diabetes mellitus without complication ([place])    History reviewed. No pertinent surgical history.  Family Hx:  Family History  Problem Relation Age of Onset   Diabetes Mother    Stroke Maternal Grandmother    Diabetes Other    Social History:  reports that she has never smoked. She has never used smokeless tobacco. She reports that she does not currently use alcohol. She reports current drug use. Drug: Marijuana.  Allergies: No Known Allergies  Medications: Prior to Admission medications   Medication Sig Start Date End Date Taking? Authorizing Provider  Accu-Chek Softclix Lancets lancets Use as directed. 07/30/21   Lenoria Chime, MD  Blood Glucose Monitoring Suppl (BLOOD GLUCOSE MONITOR SYSTEM) w/Device KIT Use to test blood sugars up to 4 times daily as directed. 07/30/21   Lenoria Chime, MD  diphenhydramine-acetaminophen (TYLENOL PM) 25-500 MG TABS tablet Take 2 tablets by mouth at bedtime as needed (headache/pain/sleep/fever).    [provider]  glucose blood test strip Use to test blood sugars up to 4 times daily as directed. 07/30/21   Lenoria Chime, MD  insulin glargine (LANTUS) 100 UNIT/ML Solostar Pen Inject 30 Units into the skin daily. 08/01/21   Carollee Leitz, MD  Insulin Pen Needle 32G X 4 MM MISC Use to inject insulin as directed up to 4 times daily. 07/30/21   Lenoria Chime, MD  metFORMIN (GLUCOPHAGE XR) 500 MG 24 hr tablet Take 1 tablet (500 mg total)  by mouth daily with breakfast. 08/04/21 08/04/22  Carollee Leitz, MD  RIVAROXABAN Alveda Reasons) VTE STARTER PACK (15 & 20 MG) Follow package directions: Take one 23m tablet by mouth twice a day. On day 22, switch to one 260mtablet once a day. Take with food. 07/30/21   PrLenoria ChimeMD    I have reviewed the patient's current medications.  Labs:  Results for orders placed or performed during the hospital encounter of 09/06/21 (from the past 48 hour(s))  CBG monitoring, ED     Status: Abnormal    Collection Time: 09/06/21 11:07 AM  Result Value Ref Range   Glucose-Capillary >600 (HH) 70 - 99 mg/dL    Comment: Glucose reference range applies only to samples taken after fasting for at least 8 hours.  Ethanol     Status: None   Collection Time: 09/06/21 11:30 AM  Result Value Ref Range   Alcohol, Ethyl (B) <10 <10 mg/dL    Comment: (NOTE) Lowest detectable limit for serum alcohol is 10 mg/dL.  For medical purposes only. Performed at [facility] [place] [address] Beta-hydroxybutyric acid     Status: Abnormal   Collection Time: 09/06/21 11:30 AM  Result Value Ref Range   Beta-Hydroxybutyric Acid >8.00 (H) 0.05 - 0.27 mmol/L    Comment: RESULTS CONFIRMED BY MANUAL DILUTION Performed at [facility] [place] Lactic acid, plasma     Status: Abnormal   Collection Time: 09/06/21 11:31 AM  Result Value Ref Range   Lactic Acid, Venous 4.0 (HH) 0.5 - 1.9 mmol/L    Comment: CRITICAL RESULT CALLED TO, READ BACK BY AND VERIFIED WITH: C COBB RN BY SSTEPHENS [id number] at [facility] [place] [id number] hCG, quantitative, pregnancy     Status: None   Collection Time: 09/06/21 11:31 AM  Result Value Ref Range   hCG, Beta Chain, Quant, S <1 <5 mIU/mL    Comment:          GEST. AGE      CONC.  (mIU/mL)   <=1 WEEK        5 - 50     2 WEEKS       50 - 500     3 WEEKS       100 - 10,000     4 WEEKS     1,000 - 30,000     5 WEEKS     3,500 - 115,000   6-8 WEEKS     12,000 - 270,000    12 WEEKS     15,000 - 220,000        FEMALE AND NON-PREGNANT FEMALE:     LESS THAN 5 mIU/mL Performed at [facility] [place] [id number] Basic metabolic panel     Status: Abnormal   Collection Time: 09/06/21 11:31 AM  Result Value Ref Range   Sodium 134 (L) 135 - 145 mmol/L   Potassium 4.8 3.5 - 5.1 mmol/L   Chloride 100 98 - 111 mmol/L   CO2 7 (L)  22 - 32 mmol/L   Glucose, Bld 1,163 (HH) 70 - 99 mg/dL    Comment: Glucose reference range applies only to samples taken after fasting for at least 8 hours. CRITICAL RESULT CALLED TO, READ BACK BY AND VERIFIED WITH: C COBB RN BY SSTEPHENS  [phone]    BUN 40 (H) 6 - 20 mg/dL   Creatinine, Ser 3.46 (H) 0.44 - 1.00 mg/dL   Calcium 9.9 8.9 - 10.3 mg/dL   GFR, Estimated 18 (L) >60 mL/min    Comment: (NOTE) Calculated using the CKD-EPI Creatinine Equation (2021)    Anion gap 27 (H) 5 - 15    Comment: RESULT REPEATED AND VERIFIED Performed at [facility] [address] [place], [place] [address]   I-Stat beta hCG blood, ED     Status: Abnormal   Collection Time: 09/06/21 12:05 PM  Result Value Ref Range   I-stat hCG, quantitative 45.7 (H) <5 mIU/mL   Comment 3            Comment:   GEST. AGE      CONC.  (mIU/mL)   <=1 WEEK        5 - 50     2 WEEKS       50 - 500     3 WEEKS       100 - 10,000     4 WEEKS     1,000 - 30,000        FEMALE AND NON-PREGNANT FEMALE:     LESS THAN 5 mIU/mL   I-Stat venous blood gas, ED     Status: Abnormal   Collection Time: 09/06/21 12:07 PM  Result Value Ref Range   pH, Ven 7.027 (LL) 7.250 - 7.430   pCO2, Ven 25.9 (L) 44.0 - 60.0 mmHg   pO2, Ven 50.0 (H) 32.0 - 45.0 mmHg   Bicarbonate 6.8 (L) 20.0 - 28.0 mmol/L   TCO2 8 (L) 22 - 32 mmol/L   O2 Saturation 67.0 %   Acid-base deficit 23.0 (H) 0.0 - 2.0 mmol/L   Sodium 138 135 - 145 mmol/L   Potassium 5.9 (H) 3.5 - 5.1 mmol/L   Calcium, Ion 1.25 1.15 - 1.40 mmol/L   HCT 51.0 (H) 36.0 - 46.0 %   Hemoglobin 17.3 (H) 12.0 - 15.0 g/dL   Sample type VENOUS    Comment NOTIFIED PHYSICIAN   Comprehensive metabolic panel     Status: Abnormal   Collection Time: 09/06/21 12:10 PM  Result Value Ref Range   Sodium 134 (L) 135 - 145 mmol/L   Potassium 5.9 (H) 3.5 - 5.1 mmol/L    Comment: SLIGHT HEMOLYSIS   Chloride 100 98 - 111 mmol/L   CO2 7 (L) 22 - 32 mmol/L   Glucose, Bld 1,168 (HH) 70 - 99  mg/dL    Comment: Glucose reference range applies only to samples taken after fasting for at least 8 hours. CALLED TO Little Ishikawa, RN AT 7510 09/07/2021 BY MACEDAJ.    BUN 38 (H) 6 - 20 mg/dL   Creatinine, Ser 3.54 (H) 0.44 - 1.00 mg/dL   Calcium 9.9 8.9 - 10.3 mg/dL   Total Protein 9.2 (H) 6.5 - 8.1 g/dL   Albumin 2.9 (L) 3.5 - 5.0 g/dL   AST 64 (H) 15 - 41 U/L   ALT 31 0 - 44 U/L   Alkaline Phosphatase 185 (H) 38 - 126 U/L   Total Bilirubin 3.5 (H) 0.3 - 1.2 mg/dL   GFR, Estimated 17 (L) >60 mL/min    Comment: (NOTE) Calculated using the CKD-EPI Creatinine Equation (2021)    Anion gap 27 (H) 5 - 15    Comment: RESULT REPEATED AND VERIFIED Performed at [facility], [address] [place], [place]  [address]   CBC with Differential     Status: Abnormal   Collection Time: 09/06/21 12:10 PM  Result Value Ref Range   WBC 21.2 (H) 4.0 - 10.5 K/uL   RBC 5.33 (H) 3.87 - 5.11 MIL/uL   Hemoglobin 15.3 (H) 12.0 - 15.0 g/dL   HCT 51.7 (H) 36.0 - 46.0 %   MCV 97.0 80.0 - 100.0 fL   MCH 28.7 26.0 - 34.0 pg   MCHC 29.6 (L) 30.0 - 36.0 g/dL   RDW 13.6 11.5 - 15.5 %   Platelets 226 150 - 400 K/uL    Comment: REPEATED TO VERIFY   nRBC 0.0 0.0 - 0.2 %   Neutrophils Relative % 89 %   Neutro Abs 18.9 (H) 1.7 - 7.7 K/uL   Lymphocytes Relative 5 %   Lymphs Abs 1.1 0.7 - 4.0 K/uL   Monocytes Relative 6 %   Monocytes Absolute 1.3 (H) 0.1 - 1.0 K/uL   Eosinophils Relative 0 %   Eosinophils Absolute 0.0 0.0 - 0.5 K/uL   Basophils Relative 0 %   Basophils Absolute 0.0 0.0 - 0.1 K/uL   WBC Morphology See Note     Comment: Increased Bands. >20% Bands  Vaculated Neutrophils    nRBC 0 0 /100 WBC   Abs Immature Granulocytes 0.00 0.00 - 0.07 K/uL    Comment: Performed at [facility], [place] [address] [place], [place] [address]  Culture, blood (routine x 2)     Status: Abnormal (Preliminary result)   Collection Time: 09/06/21 12:42 PM   Specimen: BLOOD  Result Value Ref Range    Specimen Description BLOOD RIGHT ANTECUBITAL    Special Requests      BOTTLES DRAWN AEROBIC AND ANAEROBIC Blood Culture adequate volume   Culture  Setup Time      GRAM NEGATIVE RODS IN BOTH AEROBIC AND ANAEROBIC BOTTLES CRITICAL RESULT CALLED TO, READ BACK BY AND VERIFIED WITH: PHARMD GREG ABBOTT 09/07/21@1 :47 BY TW    Culture (A)     ESCHERICHIA COLI SUSCEPTIBILITIES TO FOLLOW Performed at [facility], [place] [address] [place], [place] [address]    Report Status PENDING   Blood Culture ID Panel (Reflexed)     Status: Abnormal   Collection Time: 09/06/21 12:42 PM  Result Value Ref Range   Enterococcus faecalis NOT DETECTED NOT DETECTED   Enterococcus Faecium NOT DETECTED NOT DETECTED   Listeria monocytogenes NOT DETECTED NOT DETECTED   Staphylococcus species NOT DETECTED NOT DETECTED   Staphylococcus aureus (BCID) NOT DETECTED NOT DETECTED   Staphylococcus epidermidis NOT DETECTED NOT DETECTED   Staphylococcus lugdunensis NOT DETECTED NOT DETECTED   Streptococcus species NOT DETECTED NOT DETECTED   Streptococcus agalactiae NOT DETECTED NOT DETECTED   Streptococcus pneumoniae NOT DETECTED NOT DETECTED   Streptococcus pyogenes NOT DETECTED NOT DETECTED   A.calcoaceticus-baumannii NOT DETECTED NOT DETECTED   Bacteroides fragilis NOT DETECTED NOT DETECTED   Enterobacterales DETECTED (A) NOT DETECTED    Comment: Enterobacterales represent a large order of gram negative bacteria, not a single organism. CRITICAL RESULT CALLED TO, READ BACK BY AND VERIFIED WITH: PHARMD GREG ABBOTT 09/07/21@1 :47 BY TW    Enterobacter cloacae complex NOT DETECTED NOT DETECTED   Escherichia coli DETECTED (A) NOT DETECTED    Comment: CRITICAL RESULT CALLED TO, READ BACK BY AND VERIFIED WITH: PHARMD GREG ABBOTT 09/07/21@1 :47 BY TW    Klebsiella aerogenes NOT DETECTED NOT DETECTED   Klebsiella oxytoca NOT DETECTED NOT DETECTED   Klebsiella  pneumoniae NOT DETECTED NOT DETECTED   Proteus species NOT  DETECTED NOT DETECTED   Salmonella species NOT DETECTED NOT DETECTED   Serratia marcescens NOT DETECTED NOT DETECTED   Haemophilus influenzae NOT DETECTED NOT DETECTED   Neisseria meningitidis NOT DETECTED NOT DETECTED   Pseudomonas aeruginosa NOT DETECTED NOT DETECTED   Stenotrophomonas maltophilia NOT DETECTED NOT DETECTED   Candida albicans NOT DETECTED NOT DETECTED   Candida auris NOT DETECTED NOT DETECTED   Candida glabrata NOT DETECTED NOT DETECTED   Candida krusei NOT DETECTED NOT DETECTED   Candida parapsilosis NOT DETECTED NOT DETECTED   Candida tropicalis NOT DETECTED NOT DETECTED   Cryptococcus neoformans/gattii NOT DETECTED NOT DETECTED   CTX-M ESBL NOT DETECTED NOT DETECTED   Carbapenem resistance IMP NOT DETECTED NOT DETECTED   Carbapenem resistance KPC NOT DETECTED NOT DETECTED   Carbapenem resistance NDM NOT DETECTED NOT DETECTED   Carbapenem resist OXA 48 LIKE NOT DETECTED NOT DETECTED   Carbapenem resistance VIM NOT DETECTED NOT DETECTED    Comment: Performed at [facility], [place] [address] [place], [place] [address]  CBG monitoring, ED     Status: Abnormal   Collection Time: 09/06/21  1:07 PM  Result Value Ref Range   Glucose-Capillary >600 (HH) 70 - 99 mg/dL    Comment: Glucose reference range applies only to samples taken after fasting for at least 8 hours.   Comment 1 Notify RN    Comment 2 Document in Chart   Resp Panel by RT-PCR (Flu A&B, Covid) Nasopharyngeal Swab     Status: None   Collection Time: 09/06/21  1:19 PM   Specimen: Nasopharyngeal Swab; Nasopharyngeal(NP) swabs in vial transport medium  Result Value Ref Range   SARS Coronavirus 2 by RT PCR NEGATIVE NEGATIVE    Comment: (NOTE) SARS-CoV-2 target nucleic acids are NOT DETECTED.  The SARS-CoV-2 RNA is generally detectable in upper respiratory specimens during the acute phase of infection. The lowest concentration of SARS-CoV-2 viral copies this assay can detect is 138 copies/mL. A  negative result does not preclude SARS-Cov-2 infection and should not be used as the sole basis for treatment or other patient management decisions. A negative result may occur with  improper specimen collection/handling, submission of specimen other than nasopharyngeal swab, presence of viral mutation(s) within the areas targeted by this assay, and inadequate number of viral copies(<138 copies/mL). A negative result must be combined with clinical observations, patient history, and epidemiological information. The expected result is Negative.  Fact Sheet for Patients:  EntrepreneurPulse.com.au  Fact Sheet for Healthcare Providers:  IncredibleEmployment.be  This test is no t yet approved or cleared by the [place] FDA and  has been authorized for detection and/or diagnosis of SARS-CoV-2 by FDA under an Emergency Use Authorization (EUA). This EUA will remain  in effect (meaning this test can be used) for the duration of the COVID-19 declaration under Section 564(b)(1) of the Act, 21 U.S.C.section 360bbb-3(b)(1), unless the authorization is terminated  or revoked sooner.       Influenza A by PCR NEGATIVE NEGATIVE   Influenza B by PCR NEGATIVE NEGATIVE    Comment: (NOTE) The Xpert Xpress SARS-CoV-2/FLU/RSV plus assay is intended as an aid in the diagnosis of influenza from Nasopharyngeal swab specimens and should not be used as a sole basis for treatment. Nasal washings and aspirates are unacceptable for Xpert Xpress SARS-CoV-2/FLU/RSV testing.  Fact Sheet for Patients: EntrepreneurPulse.com.au  Fact Sheet for Healthcare Providers: IncredibleEmployment.be  This test is not  yet approved or cleared by the [place] and has been authorized for detection and/or diagnosis of SARS-CoV-2 by FDA under an Emergency Use Authorization (EUA). This EUA will remain in effect (meaning this test can be used)  for the duration of the COVID-19 declaration under Section 564(b)(1) of the Act, 21 U.S.C. section 360bbb-3(b)(1), unless the authorization is terminated or revoked.  Performed at [facility], [place] [address] [place], [place] [address]   Lactic acid, plasma     Status: Abnormal   Collection Time: 09/06/21  1:31 PM  Result Value Ref Range   Lactic Acid, Venous 4.0 (HH) 0.5 - 1.9 mmol/L    Comment: CRITICAL VALUE NOTED.  VALUE IS CONSISTENT WITH PREVIOUSLY REPORTED AND CALLED VALUE. Performed at [facility], [place] [address], [place], [place] [address]   CBG monitoring, ED     Status: Abnormal   Collection Time: 09/06/21  2:19 PM  Result Value Ref Range   Glucose-Capillary >600 (HH) 70 - 99 mg/dL    Comment: Glucose reference range applies only to samples taken after fasting for at least 8 hours.   Comment 1 Notify RN    Comment 2 Document in Chart   Glucose, capillary     Status: Abnormal   Collection Time: 09/06/21  3:06 PM  Result Value Ref Range   Glucose-Capillary >600 (HH) 70 - 99 mg/dL    Comment: Glucose reference range applies only to samples taken after fasting for at least 8 hours.  Glucose, capillary     Status: Abnormal   Collection Time: 09/06/21  3:33 PM  Result Value Ref Range   Glucose-Capillary >600 (HH) 70 - 99 mg/dL    Comment: Glucose reference range applies only to samples taken after fasting for at least 8 hours.  Glucose, capillary     Status: Abnormal   Collection Time: 09/06/21  4:07 PM  Result Value Ref Range   Glucose-Capillary >600 (HH) 70 - 99 mg/dL    Comment: Glucose reference range applies only to samples taken after fasting for at least 8 hours.  MRSA Next Gen by PCR, Nasal     Status: None   Collection Time: 09/06/21  4:08 PM   Specimen: Nasal Mucosa; Nasal Swab  Result Value Ref Range   MRSA by PCR Next Gen NOT DETECTED NOT DETECTED    Comment: (NOTE) The GeneXpert MRSA Assay (FDA approved for NASAL specimens only), is one  component of a comprehensive MRSA colonization surveillance program. It is not intended to diagnose MRSA infection nor to guide or monitor treatment for MRSA infections. Test performance is not FDA approved in patients less than 55 years old. Performed at [facility], [place] [address] [place], [place] [address]   Culture, blood (routine x 2)     Status: None (Preliminary result)   Collection Time: 09/06/21  4:51 PM   Specimen: Site Not Specified; Blood  Result Value Ref Range   Specimen Description SITE NOT SPECIFIED    Special Requests      BOTTLES DRAWN AEROBIC AND ANAEROBIC Blood Culture results may not be optimal due to an inadequate volume of blood received in culture bottles   Culture  Setup Time      GRAM NEGATIVE RODS IN BOTH AEROBIC AND ANAEROBIC BOTTLES CRITICAL VALUE NOTED.  VALUE IS CONSISTENT WITH PREVIOUSLY REPORTED AND CALLED VALUE.    Culture      GRAM NEGATIVE RODS IDENTIFICATION TO FOLLOW Performed at [facility], [place] [address], [place], [place] [address]    Report Status PENDING   Glucose, capillary     Status: Abnormal   Collection Time: 09/06/21  4:55 PM  Result Value Ref Range   Glucose-Capillary >600 (HH) 70 - 99 mg/dL    Comment: Glucose reference range applies only to samples taken after fasting for at least 8 hours.  Basic metabolic panel     Status: Abnormal   Collection Time: 09/06/21  4:56 PM  Result Value Ref Range   Sodium 146 (H) 135 - 145 mmol/L   Potassium 2.6 (LL) 3.5 - 5.1 mmol/L    Comment: CRITICAL RESULT CALLED TO, READ BACK BY AND VERIFIED WITH: S PAULSON RN BY SSTEPHENS [phone]    Chloride 113 (H) 98 - 111 mmol/L   CO2 8 (L) 22 - 32 mmol/L   Glucose, Bld 733 (HH) 70 - 99 mg/dL    Comment: Glucose reference range applies only to samples taken after fasting for at least 8 hours. CRITICAL RESULT CALLED TO, READ BACK BY AND VERIFIED WITH: I WILKINSON RN BY SSTEPHENS [phone]    BUN 38 (H) 6 - 20 mg/dL   Creatinine,  Ser 2.88 (H) 0.44 - 1.00 mg/dL   Calcium 10.4 (H) 8.9 - 10.3 mg/dL   GFR, Estimated 22 (L) >60 mL/min    Comment: (NOTE) Calculated using the CKD-EPI Creatinine Equation (2021)    Anion gap 25 (H) 5 - 15    Comment: RESULT REPEATED AND VERIFIED Performed at [facility] [address], [place], [place] [address]   Troponin I (High Sensitivity)     Status: Abnormal   Collection Time: 09/06/21  4:56 PM  Result Value Ref Range   Troponin I (High Sensitivity) 21 (H) <18 ng/L    Comment: (NOTE) Elevated high sensitivity troponin I (hsTnI) values and significant  changes across serial measurements may suggest ACS but many other  chronic and acute conditions are known to elevate hsTnI results.  Refer to the "Links" section for chest pain algorithms and additional  guidance. Performed at [facility], [place] [address] [place], [place] [address]   Brain natriuretic peptide     Status: None   Collection Time: 09/06/21  4:56 PM  Result Value Ref Range   B Natriuretic Peptide 22.3 0.0 - 100.0 pg/mL    Comment: Performed at [facility] [address], [place], [place] [address]  Glucose, capillary     Status: Abnormal   Collection Time: 09/06/21  5:32 PM  Result Value Ref Range   Glucose-Capillary 584 (HH) 70 - 99 mg/dL    Comment: Glucose reference range applies only to samples taken after fasting for at least 8 hours.  Glucose, capillary     Status: Abnormal   Collection Time: 09/06/21  6:05 PM  Result Value Ref Range   Glucose-Capillary 519 (HH) 70 - 99 mg/dL    Comment: Glucose reference range applies only to samples taken after fasting for at least 8 hours.   Comment 1 Notify RN   Glucose, capillary     Status: Abnormal   Collection Time: 09/06/21  6:36 PM  Result Value Ref Range   Glucose-Capillary 530 (HH) 70 - 99 mg/dL    Comment: Glucose reference range applies only to samples taken after fasting for at least 8 hours.  Glucose, capillary     Status:  Abnormal   Collection Time: 09/06/21  7:00 PM  Result  Value Ref Range   Glucose-Capillary 524 (HH) 70 - 99 mg/dL    Comment: Glucose reference range applies only to samples taken after fasting for at least 8 hours.  Glucose, capillary     Status: Abnormal   Collection Time: 09/06/21  7:52 PM  Result Value Ref Range   Glucose-Capillary 427 (H) 70 - 99 mg/dL    Comment: Glucose reference range applies only to samples taken after fasting for at least 8 hours.  Brain natriuretic peptide     Status: None   Collection Time: 09/06/21  8:04 PM  Result Value Ref Range   B Natriuretic Peptide 32.0 0.0 - 100.0 pg/mL    Comment: Performed at [facility], [place] [address], [place], [place] [address]  Basic metabolic panel     Status: Abnormal   Collection Time: 09/06/21  8:25 PM  Result Value Ref Range   Sodium 149 (H) 135 - 145 mmol/L   Potassium 2.6 (LL) 3.5 - 5.1 mmol/L    Comment: CRITICAL RESULT CALLED TO, READ BACK BY AND VERIFIED WITH: R. PHIOTS, RN 2133 09/06/21 A GASKINS    Chloride 117 (H) 98 - 111 mmol/L   CO2 15 (L) 22 - 32 mmol/L   Glucose, Bld 418 (H) 70 - 99 mg/dL    Comment: Glucose reference range applies only to samples taken after fasting for at least 8 hours.   BUN 39 (H) 6 - 20 mg/dL   Creatinine, Ser 2.53 (H) 0.44 - 1.00 mg/dL   Calcium 10.2 8.9 - 10.3 mg/dL   GFR, Estimated 26 (L) >60 mL/min    Comment: (NOTE) Calculated using the CKD-EPI Creatinine Equation (2021)    Anion gap 17 (H) 5 - 15    Comment: Performed at [facility] [address] [place], [place] [address]  Glucose, capillary     Status: Abnormal   Collection Time: 09/06/21  8:38 PM  Result Value Ref Range   Glucose-Capillary 343 (H) 70 - 99 mg/dL    Comment: Glucose reference range applies only to samples taken after fasting for at least 8 hours.  Glucose, capillary     Status: Abnormal   Collection Time: 09/06/21  9:12 PM  Result Value Ref Range   Glucose-Capillary 343 (H) 70 - 99  mg/dL    Comment: Glucose reference range applies only to samples taken after fasting for at least 8 hours.  I-STAT 7, (LYTES, BLD GAS, ICA, H+H)     Status: Abnormal   Collection Time: 09/06/21  9:31 PM  Result Value Ref Range   pH, Arterial 7.397 7.350 - 7.450   pCO2 arterial 24.0 (L) 32.0 - 48.0 mmHg   pO2, Arterial 99 83.0 - 108.0 mmHg   Bicarbonate 14.7 (L) 20.0 - 28.0 mmol/L   TCO2 15 (L) 22 - 32 mmol/L   O2 Saturation 98.0 %   Acid-base deficit 8.0 (H) 0.0 - 2.0 mmol/L   Sodium 155 (H) 135 - 145 mmol/L   Potassium 3.4 (L) 3.5 - 5.1 mmol/L   Calcium, Ion 1.40 1.15 - 1.40 mmol/L   HCT 42.0 36.0 - 46.0 %   Hemoglobin 14.3 12.0 - 15.0 g/dL   Patient temperature 99.0 F    Collection site RADIAL, ALLEN'S TEST ACCEPTABLE    Drawn by Operator    Sample type ARTERIAL   Urine rapid drug screen (hosp performed)     Status: None   Collection Time: 09/06/21 10:00 PM  Result Value Ref Range  Opiates NONE DETECTED NONE DETECTED   Cocaine NONE DETECTED NONE DETECTED   Benzodiazepines NONE DETECTED NONE DETECTED   Amphetamines NONE DETECTED NONE DETECTED   Tetrahydrocannabinol NONE DETECTED NONE DETECTED   Barbiturates NONE DETECTED NONE DETECTED    Comment: (NOTE) DRUG SCREEN FOR MEDICAL PURPOSES ONLY.  IF CONFIRMATION IS NEEDED FOR ANY PURPOSE, NOTIFY LAB WITHIN 5 DAYS.  LOWEST DETECTABLE LIMITS FOR URINE DRUG SCREEN Drug Class                     Cutoff (ng/mL) Amphetamine and metabolites    1000 Barbiturate and metabolites    200 Benzodiazepine                 212 Tricyclics and metabolites     300 Opiates and metabolites        300 Cocaine and metabolites        300 THC                            50 Performed at [facility], [place] [address] [place], [place] [address]   Urinalysis, Routine w reflex microscopic     Status: Abnormal   Collection Time: 09/06/21 10:00 PM  Result Value Ref Range   Color, Urine YELLOW YELLOW   APPearance CLEAR CLEAR   Specific  Gravity, Urine >1.030 (H) 1.005 - 1.030   pH 5.5 5.0 - 8.0   Glucose, UA >=500 (A) NEGATIVE mg/dL   Hgb urine dipstick LARGE (A) NEGATIVE   Bilirubin Urine SMALL (A) NEGATIVE   Ketones, ur >80 (A) NEGATIVE mg/dL   Protein, ur 100 (A) NEGATIVE mg/dL   Nitrite POSITIVE (A) NEGATIVE   Leukocytes,Ua NEGATIVE NEGATIVE    Comment: Performed at [facility] [address] [place], [place] [address]  Urinalysis, Microscopic (reflex)     Status: Abnormal   Collection Time: 09/06/21 10:00 PM  Result Value Ref Range   RBC / HPF 0-5 0 - 5 RBC/hpf   WBC, UA 0-5 0 - 5 WBC/hpf   Bacteria, UA MANY (A) NONE SEEN   Squamous Epithelial / LPF 0-5 0 - 5   Budding Yeast PRESENT     Comment: Performed at [facility], [place] [address] [place], [place] [address]  Glucose, capillary     Status: Abnormal   Collection Time: 09/06/21 10:23 PM  Result Value Ref Range   Glucose-Capillary 322 (H) 70 - 99 mg/dL    Comment: Glucose reference range applies only to samples taken after fasting for at least 8 hours.  Glucose, capillary     Status: Abnormal   Collection Time: 09/06/21 11:27 PM  Result Value Ref Range   Glucose-Capillary 270 (H) 70 - 99 mg/dL    Comment: Glucose reference range applies only to samples taken after fasting for at least 8 hours.  Glucose, capillary     Status: Abnormal   Collection Time: 09/07/21 12:36 AM  Result Value Ref Range   Glucose-Capillary 199 (H) 70 - 99 mg/dL    Comment: Glucose reference range applies only to samples taken after fasting for at least 8 hours.  Basic metabolic panel     Status: Abnormal   Collection Time: 09/07/21 12:56 AM  Result Value Ref Range   Sodium 151 (H) 135 - 145 mmol/L   Potassium 3.8 3.5 - 5.1 mmol/L   Chloride 123 (H) 98 - 111 mmol/L  CO2 18 (L) 22 - 32 mmol/L   Glucose, Bld 154 (H) 70 - 99 mg/dL    Comment: Glucose reference range applies only to samples taken after fasting for at least 8 hours.   BUN 40 (H) 6 - 20 mg/dL    Creatinine, Ser 2.71 (H) 0.44 - 1.00 mg/dL   Calcium 9.6 8.9 - 10.3 mg/dL   GFR, Estimated 24 (L) >60 mL/min    Comment: (NOTE) Calculated using the CKD-EPI Creatinine Equation (2021)    Anion gap 10 5 - 15    Comment: Performed at [facility] [address] [place], [place] [address]  Glucose, capillary     Status: Abnormal   Collection Time: 09/07/21  1:50 AM  Result Value Ref Range   Glucose-Capillary 176 (H) 70 - 99 mg/dL    Comment: Glucose reference range applies only to samples taken after fasting for at least 8 hours.  Glucose, capillary     Status: Abnormal   Collection Time: 09/07/21  2:55 AM  Result Value Ref Range   Glucose-Capillary 182 (H) 70 - 99 mg/dL    Comment: Glucose reference range applies only to samples taken after fasting for at least 8 hours.  Glucose, capillary     Status: Abnormal   Collection Time: 09/07/21  3:37 AM  Result Value Ref Range   Glucose-Capillary 187 (H) 70 - 99 mg/dL    Comment: Glucose reference range applies only to samples taken after fasting for at least 8 hours.  Basic metabolic panel     Status: Abnormal   Collection Time: 09/07/21  4:00 AM  Result Value Ref Range   Sodium 149 (H) 135 - 145 mmol/L   Potassium 4.2 3.5 - 5.1 mmol/L   Chloride 111 98 - 111 mmol/L   CO2 19 (L) 22 - 32 mmol/L   Glucose, Bld 207 (H) 70 - 99 mg/dL    Comment: Glucose reference range applies only to samples taken after fasting for at least 8 hours.   BUN 41 (H) 6 - 20 mg/dL   Creatinine, Ser 3.05 (H) 0.44 - 1.00 mg/dL   Calcium 9.3 8.9 - 10.3 mg/dL   GFR, Estimated 21 (L) >60 mL/min    Comment: (NOTE) Calculated using the CKD-EPI Creatinine Equation (2021)    Anion gap 19 (H) 5 - 15    Comment: Performed at [facility] [address], [place], [place] [address]  CBC     Status: Abnormal   Collection Time: 09/07/21  4:00 AM  Result Value Ref Range   WBC 13.4 (H) 4.0 - 10.5 K/uL   RBC 4.32 3.87 - 5.11 MIL/uL   Hemoglobin 12.1 12.0 -  15.0 g/dL   HCT 37.2 36.0 - 46.0 %   MCV 86.1 80.0 - 100.0 fL    Comment: REPEATED TO VERIFY R PHILLIPS RN SAID OK TO ACCEPT RESULTS ON 09/07/21@0500  BY CHAYES    MCH 28.0 26.0 - 34.0 pg   MCHC 32.5 30.0 - 36.0 g/dL   RDW 12.9 11.5 - 15.5 %   Platelets 158 150 - 400 K/uL   nRBC 0.2 0.0 - 0.2 %    Comment: Performed at [facility], [place] [address] [place], [place] [address]  Hemoglobin A1c     Status: Abnormal   Collection Time: 09/07/21  4:00 AM  Result Value Ref Range   Hgb A1c MFr Bld 12.6 (H) 4.8 - 5.6 %    Comment: (NOTE) Pre  diabetes:          5.7%-6.4%  Diabetes:              >6.4%  Glycemic control for   <7.0% adults with diabetes    Mean Plasma Glucose 314.92 mg/dL    Comment: Performed at [facility] [address], [place], [place] [address]  Beta-hydroxybutyric acid     Status: None   Collection Time: 09/07/21  4:00 AM  Result Value Ref Range   Beta-Hydroxybutyric Acid 0.14 0.05 - 0.27 mmol/L    Comment: Performed at [facility] [address], [place], [place] [address]  Glucose, capillary     Status: Abnormal   Collection Time: 09/07/21  4:14 AM  Result Value Ref Range   Glucose-Capillary 195 (H) 70 - 99 mg/dL    Comment: Glucose reference range applies only to samples taken after fasting for at least 8 hours.  Glucose, capillary     Status: Abnormal   Collection Time: 09/07/21  5:22 AM  Result Value Ref Range   Glucose-Capillary 173 (H) 70 - 99 mg/dL    Comment: Glucose reference range applies only to samples taken after fasting for at least 8 hours.  Glucose, capillary     Status: Abnormal   Collection Time: 09/07/21  6:20 AM  Result Value Ref Range   Glucose-Capillary 188 (H) 70 - 99 mg/dL    Comment: Glucose reference range applies only to samples taken after fasting for at least 8 hours.  Glucose, capillary     Status: Abnormal   Collection Time: 09/07/21  7:21 AM  Result Value Ref Range   Glucose-Capillary 171 (H) 70 - 99 mg/dL     Comment: Glucose reference range applies only to samples taken after fasting for at least 8 hours.  Glucose, capillary     Status: Abnormal   Collection Time: 09/07/21  7:31 AM  Result Value Ref Range   Glucose-Capillary 167 (H) 70 - 99 mg/dL    Comment: Glucose reference range applies only to samples taken after fasting for at least 8 hours.  Glucose, capillary     Status: Abnormal   Collection Time: 09/07/21  8:28 AM  Result Value Ref Range   Glucose-Capillary 147 (H) 70 - 99 mg/dL    Comment: Glucose reference range applies only to samples taken after fasting for at least 8 hours.  Basic metabolic panel     Status: Abnormal   Collection Time: 09/07/21  8:35 AM  Result Value Ref Range   Sodium 153 (H) 135 - 145 mmol/L   Potassium 4.1 3.5 - 5.1 mmol/L   Chloride 124 (H) 98 - 111 mmol/L   CO2 19 (L) 22 - 32 mmol/L   Glucose, Bld 164 (H) 70 - 99 mg/dL    Comment: Glucose reference range applies only to samples taken after fasting for at least 8 hours.   BUN 45 (H) 6 - 20 mg/dL   Creatinine, Ser 3.53 (H) 0.44 - 1.00 mg/dL   Calcium 9.6 8.9 - 10.3 mg/dL   GFR, Estimated 17 (L) >60 mL/min    Comment: (NOTE) Calculated using the CKD-EPI Creatinine Equation (2021)    Anion gap 10 5 - 15    Comment: Performed at [facility] [address] [place], [place] [address]  Magnesium     Status: None   Collection Time: 09/07/21  8:35 AM  Result Value Ref Range   Magnesium 2.3 1.7 - 2.4 mg/dL  Comment: Performed at [facility], [place] [address] [place], [place] [address]  Glucose, capillary     Status: Abnormal   Collection Time: 09/07/21  9:38 AM  Result Value Ref Range   Glucose-Capillary 181 (H) 70 - 99 mg/dL    Comment: Glucose reference range applies only to samples taken after fasting for at least 8 hours.  Glucose, capillary     Status: Abnormal   Collection Time: 09/07/21 10:34 AM  Result Value Ref Range   Glucose-Capillary 203 (H) 70 - 99 mg/dL    Comment:  Glucose reference range applies only to samples taken after fasting for at least 8 hours.  Glucose, capillary     Status: Abnormal   Collection Time: 09/07/21 11:28 AM  Result Value Ref Range   Glucose-Capillary 148 (H) 70 - 99 mg/dL    Comment: Glucose reference range applies only to samples taken after fasting for at least 8 hours.  Glucose, capillary     Status: Abnormal   Collection Time: 09/07/21 12:42 PM  Result Value Ref Range   Glucose-Capillary 123 (H) 70 - 99 mg/dL    Comment: Glucose reference range applies only to samples taken after fasting for at least 8 hours.  Glucose, capillary     Status: Abnormal   Collection Time: 09/07/21  1:51 PM  Result Value Ref Range   Glucose-Capillary 147 (H) 70 - 99 mg/dL    Comment: Glucose reference range applies only to samples taken after fasting for at least 8 hours.  Glucose, capillary     Status: Abnormal   Collection Time: 09/07/21  2:49 PM  Result Value Ref Range   Glucose-Capillary 170 (H) 70 - 99 mg/dL    Comment: Glucose reference range applies only to samples taken after fasting for at least 8 hours.  Glucose, capillary     Status: Abnormal   Collection Time: 09/07/21  4:12 PM  Result Value Ref Range   Glucose-Capillary 132 (H) 70 - 99 mg/dL    Comment: Glucose reference range applies only to samples taken after fasting for at least 8 hours.  Heparin level (unfractionated)     Status: Abnormal   Collection Time: 09/07/21  5:53 PM  Result Value Ref Range   Heparin Unfractionated <0.10 (L) 0.30 - 0.70 IU/mL    Comment: (NOTE) The clinical reportable range upper limit is being lowered to >1.10 to align with the FDA approved guidance for the current laboratory assay.  If heparin results are below expected values, and patient dosage has  been confirmed, suggest follow up testing of antithrombin III levels. Performed at [facility], [place] [address] [place], [place] [address]   APTT     Status: Abnormal   Collection  Time: 09/07/21  5:54 PM  Result Value Ref Range   aPTT 37 (H) 24 - 36 seconds    Comment:        IF BASELINE aPTT IS ELEVATED, SUGGEST PATIENT RISK ASSESSMENT BE USED TO DETERMINE APPROPRIATE ANTICOAGULANT THERAPY. Performed at [facility], [place] [address] [place], [place] [address]   Glucose, capillary     Status: Abnormal   Collection Time: 09/07/21  6:46 PM  Result Value Ref Range   Glucose-Capillary 129 (H) 70 - 99 mg/dL    Comment: Glucose reference range applies only to samples taken after fasting for at least 8 hours.  Glucose, capillary     Status: Abnormal   Collection Time: 09/07/21  7:48 PM  Result Value Ref Range   Glucose-Capillary 176 (H) 70 - 99 mg/dL    Comment: Glucose reference range applies only to samples taken after fasting for at least 8 hours.  Glucose, capillary     Status: Abnormal   Collection Time: 09/07/21 11:25 PM  Result Value Ref Range   Glucose-Capillary 287 (H) 70 - 99 mg/dL    Comment: Glucose reference range applies only to samples taken after fasting for at least 8 hours.  Glucose, capillary     Status: Abnormal   Collection Time: 09/08/21  4:41 AM  Result Value Ref Range   Glucose-Capillary 264 (H) 70 - 99 mg/dL    Comment: Glucose reference range applies only to samples taken after fasting for at least 8 hours.  Basic metabolic panel     Status: Abnormal   Collection Time: 09/08/21  4:45 AM  Result Value Ref Range   Sodium 151 (H) 135 - 145 mmol/L   Potassium 4.6 3.5 - 5.1 mmol/L   Chloride 121 (H) 98 - 111 mmol/L   CO2 18 (L) 22 - 32 mmol/L   Glucose, Bld 310 (H) 70 - 99 mg/dL    Comment: Glucose reference range applies only to samples taken after fasting for at least 8 hours.   BUN 61 (H) 6 - 20 mg/dL   Creatinine, Ser 6.22 (H) 0.44 - 1.00 mg/dL   Calcium 8.5 (L) 8.9 - 10.3 mg/dL   GFR, Estimated 9 (L) >60 mL/min    Comment: (NOTE) Calculated using the CKD-EPI Creatinine Equation (2021)    Anion gap 12 5 - 15    Comment:  Performed at [facility] [address] [place] [address], [place], [place] [address]  CBC     Status: Abnormal   Collection Time: 09/08/21  4:45 AM  Result Value Ref Range   WBC 11.5 (H) 4.0 - 10.5 K/uL   RBC 3.93 3.87 - 5.11 MIL/uL   Hemoglobin 11.3 (L) 12.0 - 15.0 g/dL   HCT 32.7 (L) 36.0 - 46.0 %   MCV 83.2 80.0 - 100.0 fL   MCH 28.8 26.0 - 34.0 pg   MCHC 34.6 30.0 - 36.0 g/dL   RDW 13.4 11.5 - 15.5 %   Platelets PLATELET CLUMPS NOTED ON SMEAR, UNABLE TO ESTIMATE 150 - 400 K/uL    Comment: Immature Platelet Fraction may be clinically indicated, consider ordering this additional test [id number] REPEATED TO VERIFY    nRBC 0.0 0.0 - 0.2 %    Comment: Performed at [facility], [place] [address] [place], [place] [address]  Hepatic function panel     Status: Abnormal   Collection Time: 09/08/21  4:45 AM  Result Value Ref Range   Total Protein 5.9 (L) 6.5 - 8.1 g/dL   Albumin 1.6 (L) 3.5 - 5.0 g/dL   AST 23 15 - 41 U/L   ALT 28 0 - 44 U/L   Alkaline Phosphatase 113 38 - 126 U/L   Total Bilirubin 0.4 0.3 - 1.2 mg/dL   Bilirubin, Direct <0.1 0.0 - 0.2 mg/dL   Indirect Bilirubin NOT CALCULATED 0.3 - 0.9 mg/dL    Comment: Performed at [facility] [address], [place], [place] [address]  hCG, quantitative, pregnancy     Status: None   Collection Time: 09/08/21  4:45 AM  Result Value Ref Range   hCG, Beta Chain, Quant, S 1 <5 mIU/mL    Comment:  GEST. AGE      CONC.  (mIU/mL)   <=1 WEEK        5 - 50     2 WEEKS       50 - 500     3 WEEKS       100 - 10,000     4 WEEKS     1,000 - 30,000     5 WEEKS     3,500 - 115,000   6-8 WEEKS     12,000 - 270,000    12 WEEKS     15,000 - 220,000        FEMALE AND NON-PREGNANT FEMALE:     LESS THAN 5 mIU/mL Performed at [facility], [place] [address] [place], [place] [address]   Heparin level (unfractionated)     Status: Abnormal   Collection Time: 09/08/21  4:45 AM  Result Value Ref Range   Heparin Unfractionated 0.19  (L) 0.30 - 0.70 IU/mL    Comment: (NOTE) The clinical reportable range upper limit is being lowered to >1.10 to align with the FDA approved guidance for the current laboratory assay.  If heparin results are below expected values, and patient dosage has  been confirmed, suggest follow up testing of antithrombin III levels. Performed at [facility], [place] [address] [place], [place] [address]   Glucose, capillary     Status: Abnormal   Collection Time: 09/08/21  7:43 AM  Result Value Ref Range   Glucose-Capillary 234 (H) 70 - 99 mg/dL    Comment: Glucose reference range applies only to samples taken after fasting for at least 8 hours.  Sodium, urine, random     Status: None   Collection Time: 09/08/21  8:11 AM  Result Value Ref Range   Sodium, Ur 108 mmol/L    Comment: Performed at [facility] [address] [place], [place] [address]  Creatinine, urine, random     Status: None   Collection Time: 09/08/21  8:11 AM  Result Value Ref Range   Creatinine, Urine 55.39 mg/dL    Comment: Performed at [facility] [address], [place], [place] [address]     ROS:  Pertinent items are noted in HPI.  Physical Exam: Vitals:   09/08/21 0830 09/08/21 0900  BP: (!) 133/96 (!) 132/106  Pulse: (!) 121 (!) 127  Resp: (!) 43 (!) 37  Temp:    SpO2: 98% 98%     General exam: Appears calm and comfortable  Respiratory system: Clear to auscultation. Respiratory effort normal. No wheezing or crackle Cardiovascular system: S1 & S2 heard, RRR.   Gastrointestinal system: Abdomen is nondistended, soft and nontender. Normal bowel sounds heard. Central nervous system: Somnolent, lethargic, responds to name and intermittently to commands Extremities: No peripheral edema appreciated Skin: No rashes, lesions or ulcers Psychiatry: Judgement and insight appear normal. Mood & affect appropriate.   Assessment/Plan:  #Oliguric acute renal failure, possibly hemodynamically  mediated from hypovolemia 2/2 DKA, bacteremia - Lab work from prior to admission revealed normal renal function, no known history of renal disease - Initially with anion gap metabolic acidosis thought most likely to be 2/2 lactic acidemia - Renal function initially improved with fluids, but has acutely worsened over last 24 hours - On UA from 12/5, noted glucosuria, ketonuria, proteinuria, and myoglobinuria - Renal ultrasound yesterday without evidence of obstruction - Patient had 34cc UOP yesterday, 0.3cc/kg/hr - Today on exam patient does not appear  fluid overloaded or overtly dry - AKI could be 2/2 ATN due to severe dehydration from infection and DKA - No acute indication for dialysis at this time - Would continue with fluids for now until repeat lab work returns - Will repeat UA and BMET this afternoon. Will also obtain CK given myoglobinuria on original UA. - Can also consider albumin for intravascular expansion  - Continue Foley catheter  - Strict I/O, avoid nephrotoxins  #Hypernatremia - Sodium has steadily been ~150 since admission, likely in setting of marked dehydration - Free water deficit today 3.5L - To correct at 0.64mq/L/hr, will have D5W at 150/hr x 22/hr - Follow-up daily BMET  #Normocytic anemia - Hgb 11.3 today, appears to be dilutional in setting of fluid resuscitation - Continue to monitor, daily CBC  #Uncontrolled type 2 diabetes mellitus #Diabetic ketoacidosis - Overnight sugars appear to have increased to 200-300's - With addition of D5W, likely patient will need further adjustments to insulin regimen - Management per primary  #E. coli bacteremia 2/2 urinary source - BCID positive E. coli, susceptibilities pending - Patient on ceftriaxone, management per primary   #Acute metabolic encephalopathy likely 2/2 DKA, sepsis - Patient does respond to name, intermittently commands - Expect to continue to improve with improvement of sugars, infection -  Management per primary  #Hx PE/DVT - On heparin gtt, planning for transition back to Xarelto at discharge - Management per primary  PSanjuan Dame MD Internal Medicine Resident PGY-2 09/08/2021, 10:00 AM

## 2021-09-08 NOTE — Progress Notes (Signed)
Inpatient Diabetes Program Recommendations  AACE/ADA: New Consensus Statement on Inpatient Glycemic Control (2015)  Target Ranges:  Prepandial:   less than 140 mg/dL      Peak postprandial:   less than 180 mg/dL (1-2 hours)      Critically ill patients:  140 - 180 mg/dL   Lab Results  Component Value Date   GLUCAP 234 (H) 09/08/2021   HGBA1C 12.6 (H) 09/07/2021    Latest Reference Range & Units 03/11/21 23:34 07/29/21 18:05 09/07/21 04:00  Hemoglobin A1C 4.8 - 5.6 % 14.3 (H) 13.3 (H) 12.6 (H)    Review of Glycemic Control  Latest Reference Range & Units 09/07/21 19:48 09/07/21 23:25 09/08/21 04:41 09/08/21 07:43  Glucose-Capillary 70 - 99 mg/dL 161 (H) 096 (H) 045 (H) 234 (H)   Diabetes history: DM  Outpatient Diabetes medications:  Lantus 30 units daily, Metformin 500 mg daily Current orders for Inpatient glycemic control:  Novolog resistant q 4 hours Levemir 15 units bid  Inpatient Diabetes Program Recommendations:    A1c is slightly better.  Patient transitioned off insulin drip on 12/6.  ? If patient needs rapid acting insulin added to her home regimen.  Will need to also confirm if she was taking insulin prior to admit, as it appears that insulin was recently restarted back in October, 2022 during prior hospitalization as well as metformin. Will speak with patient when more awake.    Thanks,  Beryl Meager, RN, BC-ADM Inpatient Diabetes Coordinator Pager [phone]  (8a-5p)

## 2021-09-09 LAB — URINE CULTURE: Culture: 30000 — AB

## 2021-09-09 LAB — RENAL FUNCTION PANEL
Albumin: <1.5 g/dL — ABNORMAL LOW (ref 3.5–5.0)
Anion gap: 13 (ref 5–15)
BUN: 81 mg/dL — ABNORMAL HIGH (ref 6–20)
CO2: 16 mmol/L — ABNORMAL LOW (ref 22–32)
Calcium: 8.1 mg/dL — ABNORMAL LOW (ref 8.9–10.3)
Chloride: 124 mmol/L — ABNORMAL HIGH (ref 98–111)
Creatinine, Ser: 6.86 mg/dL — ABNORMAL HIGH (ref 0.44–1.00)
GFR, Estimated: 8 mL/min — ABNORMAL LOW (ref >60)
Glucose, Bld: 150 mg/dL — ABNORMAL HIGH (ref 70–99)
Phosphorus: <1 mg/dL — CL (ref 2.5–4.6)
Potassium: 4.5 mmol/L (ref 3.5–5.1)
Sodium: 153 mmol/L — ABNORMAL HIGH (ref 135–145)

## 2021-09-09 LAB — CULTURE, BLOOD (ROUTINE X 2): Special Requests: ADEQUATE

## 2021-09-09 LAB — CBC
HCT: 31.9 % — ABNORMAL LOW (ref 36.0–46.0)
HCT: 32.4 % — ABNORMAL LOW (ref 36.0–46.0)
Hemoglobin: 10.7 g/dL — ABNORMAL LOW (ref 12.0–15.0)
Hemoglobin: 11.1 g/dL — ABNORMAL LOW (ref 12.0–15.0)
MCH: 28.3 pg (ref 26.0–34.0)
MCH: 28.8 pg (ref 26.0–34.0)
MCHC: 33.5 g/dL (ref 30.0–36.0)
MCHC: 34.3 g/dL (ref 30.0–36.0)
MCV: 84.4 fL (ref 80.0–100.0)
MCV: 83.9 fL (ref 80.0–100.0)
Platelets: UNDETERMINED 10*3/uL (ref 150–400)
Platelets: 102 10*3/uL — ABNORMAL LOW (ref 150–400)
RBC: 3.78 MIL/uL — ABNORMAL LOW (ref 3.87–5.11)
RBC: 3.86 MIL/uL — ABNORMAL LOW (ref 3.87–5.11)
RDW: 14.2 % (ref 11.5–15.5)
RDW: 14.5 % (ref 11.5–15.5)
WBC: 8.4 10*3/uL (ref 4.0–10.5)
WBC: 8.8 10*3/uL (ref 4.0–10.5)
nRBC: 0 % (ref 0.0–0.2)
nRBC: 0 % (ref 0.0–0.2)

## 2021-09-09 LAB — GLUCOSE, CAPILLARY
Glucose-Capillary: 166 mg/dL — ABNORMAL HIGH (ref 70–99)
Glucose-Capillary: 130 mg/dL — ABNORMAL HIGH (ref 70–99)
Glucose-Capillary: 118 mg/dL — ABNORMAL HIGH (ref 70–99)
Glucose-Capillary: 226 mg/dL — ABNORMAL HIGH (ref 70–99)
Glucose-Capillary: 162 mg/dL — ABNORMAL HIGH (ref 70–99)
Glucose-Capillary: 102 mg/dL — ABNORMAL HIGH (ref 70–99)

## 2021-09-09 LAB — HEPARIN LEVEL (UNFRACTIONATED)
Heparin Unfractionated: 0.21 IU/mL — ABNORMAL LOW (ref 0.30–0.70)
Heparin Unfractionated: 0.19 IU/mL — ABNORMAL LOW (ref 0.30–0.70)

## 2021-09-09 MED ORDER — SODIUM CHLORIDE 0.45 % IV BOLUS
1000.0000 mL | Freq: Once | INTRAVENOUS | Status: AC
  Administered 2021-09-09: 1000 mL via INTRAVENOUS

## 2021-09-09 MED ORDER — PROSOURCE TF PO LIQD
45.0000 mL | Freq: Two times a day (BID) | ORAL | Status: DC
  Administered 2021-09-10 (×2): 45 mL
  Filled 2021-09-09 (×2): qty 45

## 2021-09-09 MED ORDER — METOCLOPRAMIDE HCL 5 MG/ML IJ SOLN
5.0000 mg | Freq: Two times a day (BID) | INTRAMUSCULAR | Status: DC
  Administered 2021-09-09 – 2021-09-11 (×5): 5 mg via INTRAVENOUS
  Filled 2021-09-09 (×5): qty 2

## 2021-09-09 MED ORDER — SODIUM PHOSPHATES 45 MMOLE/15ML IV SOLN
30.0000 mmol | Freq: Once | INTRAVENOUS | Status: AC
  Administered 2021-09-09: 30 mmol via INTRAVENOUS
  Filled 2021-09-09: qty 10

## 2021-09-09 MED ORDER — FAMOTIDINE IN NACL 20-0.9 MG/50ML-% IV SOLN
20.0000 mg | INTRAVENOUS | Status: AC
  Administered 2021-09-09 – 2021-09-11 (×3): 20 mg via INTRAVENOUS
  Filled 2021-09-09 (×3): qty 50

## 2021-09-09 MED ORDER — SERTRALINE HCL 50 MG PO TABS
50.0000 mg | ORAL_TABLET | Freq: Every day | ORAL | Status: DC
Start: 2021-09-09 — End: 2021-09-10
  Administered 2021-09-09 – 2021-09-10 (×2): 50 mg via ORAL
  Filled 2021-09-09: qty 1

## 2021-09-09 MED ORDER — CALCIUM CARBONATE ANTACID 500 MG PO CHEW
1.0000 | CHEWABLE_TABLET | Freq: Four times a day (QID) | ORAL | Status: DC | PRN
  Administered 2021-09-09 – 2021-09-11 (×2): 200 mg via ORAL
  Filled 2021-09-09 (×2): qty 1

## 2021-09-09 MED ORDER — SODIUM CHLORIDE 0.45 % IV SOLN
INTRAVENOUS | Status: DC
Start: 2021-09-09 — End: 2021-09-10

## 2021-09-09 MED ORDER — OSMOLITE 1.5 CAL PO LIQD
1000.0000 mL | ORAL | Status: DC
Start: 2021-09-10 — End: 2021-09-11
  Administered 2021-09-10: 10:00:00 1000 mL
  Filled 2021-09-09 (×2): qty 1000

## 2021-09-09 MED ORDER — BOOST / RESOURCE BREEZE PO LIQD CUSTOM
1.0000 | Freq: Three times a day (TID) | ORAL | Status: DC
  Administered 2021-09-09 – 2021-09-11 (×4): 1 via ORAL

## 2021-09-09 MED ORDER — CEFAZOLIN SODIUM-DEXTROSE 2-4 GM/100ML-% IV SOLN
2.0000 g | Freq: Two times a day (BID) | INTRAVENOUS | Status: DC
Start: 2021-09-10 — End: 2021-09-14
  Administered 2021-09-10 – 2021-09-13 (×8): 2 g via INTRAVENOUS
  Filled 2021-09-09 (×9): qty 100

## 2021-09-09 NOTE — Progress Notes (Signed)
Inpatient Diabetes Program Recommendations  AACE/ADA: New Consensus Statement on Inpatient Glycemic Control (2015)  Target Ranges:  Prepandial:   less than 140 mg/dL      Peak postprandial:   less than 180 mg/dL (1-2 hours)      Critically ill patients:  140 - 180 mg/dL   Lab Results  Component Value Date   GLUCAP 118 (H) 09/09/2021   HGBA1C 12.6 (H) 09/07/2021    Review of Glycemic Control  Latest Reference Range & Units 09/08/21 19:40 09/08/21 23:27 09/09/21 03:44 09/09/21 08:32 09/09/21 11:02  Glucose-Capillary 70 - 99 mg/dL 867 (H) 544 (H) 920 (H) 130 (H) 118 (H)   Diabetes history: DM 2 Outpatient Diabetes medications:  Lantus 30 units daily Metformin 500 mg with breakfast  Current orders for Inpatient glycemic control:  Novolog resistant q 4 hours Levemir 15 units bid Inpatient Diabetes Program Recommendations:    Spoke with patient regarding DM management.  She was recently restarted on insulin and metformin during last hospitalization. Patient was prescribed insulin at discharge and states she paid out of pocket for Lantus (borrowed money from [place]).  She states she was taking insulin intermittently but not consistently.  Discussed importance of taking insulin consistently and potential need for rapid acting insulin to cover meals as well.  A1C is slightly better.  Patient will not be able to be restarted on Metformin due to renal function.   May need more affordable insulin regimen such as 70/30 due to no prescription coverage.  She states she plans to stay with her mom after hospitalization and wonders if she needs to apply for disability.  Need to make sure she has plan for medication/costs/etc. At d/c.  Will place [facility] referral.   Thanks,  Beryl Meager, RN, BC-ADM Inpatient Diabetes Coordinator Pager [phone]  (8a-5p)

## 2021-09-09 NOTE — Progress Notes (Signed)
ANTICOAGULATION CONSULT NOTE  Pharmacy Consult:  Heparin Indication: pulmonary embolus and DVT  No Known Allergies  Patient Measurements: Weight: 92.1 kg (203 lb 0.7 oz) Heparin Dosing Weight:  78 kg  Vital Signs: Temp: 98.8 F (37.1 C) (12/08 0400) Temp Source: Oral (12/08 0400) BP: 111/70 (12/08 0600) Pulse Rate: 113 (12/08 0600)  Labs: Recent Labs    09/06/21 1210 09/06/21 1656 09/06/21 2025 09/06/21 2131 09/07/21 0056 09/07/21 0400 09/07/21 0835 09/07/21 1753 09/07/21 1754 09/08/21 0445 09/08/21 1402 09/08/21 1553 09/08/21 1554 09/09/21 0642  HGB 15.3*  --   --  14.3  --  12.1  --   --   --  11.3*  --   --   --   --   HCT 51.7*  --   --  42.0  --  37.2  --   --   --  32.7*  --   --   --   --   PLT 226  --   --   --   --  158  --   --   --  PLATELET CLUMPS NOTED ON SMEAR, UNABLE TO ESTIMATE  --   --   --   --   APTT  --   --   --   --   --   --   --   --  37*  --   --   --   --   --   HEPARINUNFRC  --   --   --   --   --   --   --    < >  --  0.19* 0.31  --   --  0.21*  CREATININE 3.54* 2.88*   < >  --    < > 3.05* 3.53*  --   --  6.22*  --   --  6.69*  --   CKTOTAL  --   --   --   --   --   --   --   --   --   --   --  65  --   --   TROPONINIHS  --  21*  --   --   --   --   --   --   --   --   --   --   --   --    < > = values in this interval not displayed.     Estimated Creatinine Clearance: 14.3 mL/min (A) (by C-G formula based on SCr of 6.69 mg/dL (H)).   Assessment: 61 YOF presented on 12/5 with AMS, DKA and bacteremia.  Patient has a history of PE in October 2022 and DVT.  She was seen in the ED on 12/4 for hemoptysis/hematemesis and left AMA.  No hemoptysis observed thus far this admission and CBC is stable.  Pharmacy consulted for IV heparin dosing.    Heparin level sub-therapeutic at 0.21 units/mL.  No issue with infusion per RN.  There is a small blood clot when patient blew her nose, but no bleeding.  Goal of Therapy:  Heparin level 0.3-0.7  units/ml Monitor platelets by anticoagulation protocol: Yes   Plan:  Increase heparin gtt to 1550 units/hr, no bolus with recent hemoptysis Check 8 hr heparin level Daily heparin level and CBC Monitor closely for s/sx of bleeding  Danyela Posas D. Laney Potash, PharmD, BCPS, BCCCP 09/09/2021, 8:39 AM

## 2021-09-09 NOTE — Progress Notes (Signed)
ANTICOAGULATION CONSULT NOTE  Pharmacy Consult:  Heparin Indication: pulmonary embolus and DVT  No Known Allergies  Patient Measurements: Weight: 92.1 kg (203 lb 0.7 oz) Heparin Dosing Weight:  78 kg  Vital Signs: Temp: 99.1 F (37.3 C) (12/08 1700) Temp Source: Oral (12/08 1700) BP: 125/82 (12/08 1900) Pulse Rate: 120 (12/08 1900)  Labs: Recent Labs    09/07/21 1754 09/08/21 0445 09/08/21 1402 09/08/21 1553 09/08/21 1554 09/09/21 0634 09/09/21 0642 09/09/21 1454 09/09/21 1806  HGB  --  11.3*  --   --   --   --  10.7* 11.1*  --   HCT  --  32.7*  --   --   --   --  31.9* 32.4*  --   PLT  --  PLATELET CLUMPS NOTED ON SMEAR, UNABLE TO ESTIMATE  --   --   --   --  PLATELET CLUMPS NOTED ON SMEAR, UNABLE TO ESTIMATE 102*  --   APTT 37*  --   --   --   --   --   --   --   --   HEPARINUNFRC  --  0.19* 0.31  --   --   --  0.21*  --  0.19*  CREATININE  --  6.22*  --   --  6.69* 6.86*  --   --   --   CKTOTAL  --   --   --  65  --   --   --   --   --      Estimated Creatinine Clearance: 14 mL/min (A) (by C-G formula based on SCr of 6.86 mg/dL (H)).   Assessment: 35 YOF presented on 12/5 with AMS, DKA and bacteremia.  Patient has a history of PE in October 2022 and DVT.  She was seen in the ED on 12/4 for hemoptysis/hematemesis and left AMA.  No hemoptysis observed thus far this admission and CBC is stable.  Pharmacy consulted for IV heparin dosing.    Heparin level sub-therapeutic at 0.19 units/mL.  No issue with infusion per RN.  No signs/symptoms of bleeding.  Goal of Therapy:  Heparin level 0.3-0.7 units/ml Monitor platelets by anticoagulation protocol: Yes   Plan:  Increase heparin gtt to 1700 units/hr, no bolus with recent hemoptysis Check 8 hr heparin level Daily heparin level and CBC Monitor closely for s/sx of bleeding  Thank you for allowing pharmacy to be a part of this patient's care.  Tomie China, PharmD Clinical Pharmacist  Please check AMION for  all [facility] Pharmacy numbers After 10:00 PM, call Main Pharmacy [phone]

## 2021-09-09 NOTE — Progress Notes (Addendum)
[facility] NEPHROLOGY PROGRESS NOTE  Assessment/ Plan:  Pt is a 30 y.o. yo female  with uncontrolled type II diabetes mellitus, previous DVT, recent submassive pulmonary embolism who was admitted [id number] for DKA, metabolic encephalopathy, and acute renal failure.  #Oliguric acute renal failure, possibly hemodynamically mediated vs intrinsic cause - With ongoing fluid, renal function continues to worsen, sCr 6.8<6.2, GFR 8>8 - Patient had 0.4 cc/kg/hr urine output yesterday - Strong maternal association with autoimmune disease. With continued worsening of renal function and hematuria present on yesterday's UA, will obtain work-up for autoimmune/vasculitis causes (ANA, ANCA, complements, RF, anti-dsDNA). CK negative, no evidence of rhabdo.  - Would further continue with IV fluids for today, mentation much improved from yesterday. Possibly renal failure multi-factorial with pre-renal/ATN etiology and intrinsic cause - No overt uremic symptoms today, no acute indication for HD at this time. If continues to be oliguric with worsening renal function will pursue temporary dialysis - Will discuss albumin as well given albumin <1.5 for intravascular expansion - Continue Foley catheter - Strict I/O, avoid nephrotoxins  #Hypovolemic hypernatremia - Corrected Na yesterday 156, down to 151 today with 1/2NS.  - Free water deficit today 4.3L - Will increase 1/2 NS to 150cc/hr and give 1/2NS bolus. Can consider switching to more isotonic solution if sodium does not improve tomorrow - Follow-up daily BMET  #Hypophosphatemia - Likely in setting of DKA, insulin-dependent diabetes - Replacement per primary team  #Normocytic anemia - Hgb 10.7 from 11.3, likely dilutional with all cell lines decreasing - No signs of bleeding on exam - Daily CBC  #Uncontrolled type II diabetes mellitus #Diabetic ketoacidosis - Sugars appear to be well-controlled over last 24 hours on current regimen -  Management per primary  #E. coli bacteremia 2/2 urinary source - On Ancef, management per primary - Avoid nephrotoxins  #Hx PE/DVT - On heparin gtt per primary - Will repeat CBC for platelets  Subjective:    This morning patient is awake, reporting that she feels better. She is endorsing some nausea, but otherwise no acute complaints. Discussed with patient and her mother at bedside.  Objective Vital signs in last 24 hours: Vitals:   09/09/21 0400 09/09/21 0500 09/09/21 0600 09/09/21 0800  BP: 111/74 109/74 111/70 127/85  Pulse: (!) 108 (!) 104 (!) 113 (!) 115  Resp: (!) 30 (!) 38 (!) 29 (!) 27  Temp: 98.8 F (37.1 C)     TempSrc: Oral     SpO2: 96% 97% 96% 97%  Weight:  92.1 kg     Weight change: 3 kg  Intake/Output Summary (Last 24 hours) at 09/09/2021 0858 Last data filed at 09/09/2021 0700 Gross per 24 hour  Intake 2846.14 ml  Output 953 ml  Net 1893.14 ml    Labs: Basic Metabolic Panel: Recent Labs  Lab 09/08/21 0445 09/08/21 1554 09/09/21 0634  NA 151* 153* 153*  K 4.6 4.4 4.5  CL 121* 121* 124*  CO2 18* 17* 16*  GLUCOSE 310* 175* 150*  BUN 61* 69* 81*  CREATININE 6.22* 6.69* 6.86*  CALCIUM 8.5* 8.6* 8.1*  PHOS  --   --  <1.0*   Liver Function Tests: Recent Labs  Lab 09/06/21 1210 09/08/21 0445 09/09/21 0634  AST 64* 23  --   ALT 31 28  --   ALKPHOS 185* 113  --   BILITOT 3.5* 0.4  --   PROT 9.2* 5.9*  --   ALBUMIN 2.9* 1.6* <1.5*   No results for input(s): LIPASE,  AMYLASE in the last 168 hours. No results for input(s): AMMONIA in the last 168 hours. CBC: Recent Labs  Lab 09/06/21 1210 09/06/21 2131 09/07/21 0400 09/08/21 0445 09/09/21 0642  WBC 21.2*  --  13.4* 11.5* 8.4  NEUTROABS 18.9*  --   --   --   --   HGB 15.3*   < > 12.1 11.3* 10.7*  HCT 51.7*   < > 37.2 32.7* 31.9*  MCV 97.0  --  86.1 83.2 84.4  PLT 226  --  158 PLATELET CLUMPS NOTED ON SMEAR, UNABLE TO ESTIMATE PLATELET CLUMPS NOTED ON SMEAR, UNABLE TO ESTIMATE   < > =  values in this interval not displayed.   Cardiac Enzymes: Recent Labs  Lab 09/08/21 1553  CKTOTAL 65   CBG: Recent Labs  Lab 09/08/21 1540 09/08/21 1940 09/08/21 2327 09/09/21 0344 09/09/21 0832  GLUCAP 161* 162* 201* 166* 130*    Iron Studies: No results for input(s): IRON, TIBC, TRANSFERRIN, FERRITIN in the last 72 hours. Studies/Results: US RENAL  Result Date: 09/07/2021 CLINICAL DATA:  Acute kidney injury EXAM: RENAL / URINARY TRACT ULTRASOUND COMPLETE COMPARISON:  None. FINDINGS: Right Kidney: Renal measurements: 12.3 x 6.7 x 6.2 = volume: 264 mL. Echogenicity within normal limits. No mass or hydronephrosis visualized. Left Kidney: Renal measurements: 10.6 x 4.7 x 5.1 = volume: 133 mL. Echogenicity within normal limits. No mass or hydronephrosis visualized. Bladder: Appears normal for degree of bladder distention. Echogenic debris in the dependent urinary bladder. Other: None. IMPRESSION: 1.  No evidence of nephrolithiasis or hydronephrosis. 2. No urinary bladder wall thickening. Echogenic debris in the dependent urinary bladder. Electronically Signed   By: Larose Hires D.O.   On: 09/07/2021 10:38    Medications: Infusions:  sodium chloride     cefTRIAXone (ROCEPHIN)  IV 2 g (09/09/21 0831)   heparin 1,550 Units/hr (09/09/21 0845)   sodium phosphate  Dextrose 5% IVPB      Scheduled Medications:  Chlorhexidine Gluconate Cloth  6 each Topical Daily   insulin aspart  0-20 Units Subcutaneous Q4H   insulin detemir  15 Units Subcutaneous BID   mouth rinse  15 mL Mouth Rinse BID   pantoprazole (PROTONIX) IV  40 mg Intravenous QHS   sodium chloride flush  10-40 mL Intracatheter Q12H    have reviewed scheduled and prn medications.  Physical Exam: General: NAD, comfortable Heart::RRR, s1s2 nl Lungs: Clear b/l, no crackle Abdomen: Soft, non-tender, non-distended Extremities:No edema Neuro: Awake, alert, conversing appropriately  Evlyn Kanner, MD Internal Medicine  Resident PGY-2 09/09/2021,8:58 AM  LOS: 3 days

## 2021-09-09 NOTE — Progress Notes (Signed)
Initial Nutrition Assessment  DOCUMENTATION CODES:   Severe malnutrition in context of acute illness/injury  INTERVENTION:   Once post-pyloric Cortrak tube placed on 12/09 and placement is confirmed with abdominal x-ray, initiate tube feeds: - Start Osmolite 1.5 @ 20 ml/hr and advance by 10 ml q 8 hours to goal rate of 55 ml/hr (1320 ml/day) - ProSource TF 45 ml BID  Tube feeding regimen at goal rate will provide 2060 kcal, 105 grams of protein, and 1006 ml of H2O.   - Boost Breeze po TID, each supplement provides 250 kcal and 9 grams of protein  NUTRITION DIAGNOSIS:   Severe Malnutrition related to acute illness (bacteremia, DKA, recent submassive PE) as evidenced by mild muscle depletion, energy intake < or equal to 50% for > or equal to 5 days, percent weight loss (10.8% weight loss in less than 2 months).  GOAL:   Patient will meet greater than or equal to 90% of their needs  MONITOR:   PO intake, Supplement acceptance, Diet advancement, Labs, Weight trends, TF tolerance, I & O's  REASON FOR ASSESSMENT:   Consult Enteral/tube feeding initiation and management  ASSESSMENT:   30 year old female who presented to the ED on 12/05 with AMS. PMH of DVT, recent admission for submassive PE in October 2022, poorly controlled T2DM. Pt admitted with DKA, AKI, sepsis secondary to bacteremia from UTI.  12/07 - clear liquids  Discussed pt with RN and during ICU rounds. Per CCM, pt likely with undiagnosed gastroparesis. Reglan added today for persistent nausea.  Spoke with pt at bedside. Explained plan for post-pyloric Cortrak placement tomorrow for initiation of enteral nutrition. Pt expresses understanding and requests that Cortrak be placed today if able. Explained that Cortrak team only available Monday, Wednesday, Friday. Pt expresses understanding.  Pt reports no intake of solid foods for greater than 1 month. She reports that she has only been taking in liquids like Gatorade,  ginger ale, water, and icees. Pt states that she has not been drinking protein shakes, milkshakes, or smoothies.  Pt reports that she is losing weight "like crazy." She reports a UBW of 212 lbs. She states that now she has lost weight down to 190 lbs. She reports that this has occurred over the last month.  Reviewed weight history in chart. Pt with an 11.1 kg weight loss since 08/04/21. This is a 10.8% weight loss in less than 2 months which is severe and significant for timeframe. Pt meets criteria for severe acute malnutrition.  RD provided pt with a Boost Breeze at time of visit. She reports liking the taste. Pt reports being able to "keep things down" today and states that she is not currently having nausea. Pt was able to consume a mango icee and some cranberry juice without vomiting. Pt endorses some feelings of acid reflux. Pt encouraged to continue to try to take in what she can via PO route.  Admit weight: 87.3 kg Current weight: 92.1 kg  Medications reviewed and include: SSI q 4 hours, levemir 15 units BID, IV reglan 5 mg q 12 hours, IV protonix, IV abx, heparin drip, IV sodium phosphate 30 mmol once IVF: 1/2NS @ 150 ml/hr  Labs reviewed: sodium 153, BUN 81, creatinine 6.86, phosphorus <1.0, hemoglobin A1C 12.6 CBG's: 118-201 x 24 hours  UOP: 952 ml x 24 hours I/O's: +10.7 L since admit  NUTRITION - FOCUSED PHYSICAL EXAM:  Flowsheet Row Most Recent Value  Orbital Region No depletion  Upper Arm Region No depletion  Thoracic  and Lumbar Region No depletion  Buccal Region Mild depletion  Temple Region Mild depletion  Clavicle Bone Region Mild depletion  Clavicle and Acromion Bone Region Mild depletion  Scapular Bone Region Mild depletion  Dorsal Hand Mild depletion  Patellar Region Mild depletion  Anterior Thigh Region Moderate depletion  Posterior Calf Region Mild depletion  Edema (RD Assessment) Mild  Hair Reviewed  Eyes Reviewed  Mouth Reviewed  Skin Reviewed  Nails  Reviewed       Diet Order:   Diet Order             Diet clear liquid Room service appropriate? Yes; Fluid consistency: Thin  Diet effective now                   EDUCATION NEEDS:   Education needs have been addressed  Skin:  Skin Assessment: Reviewed RN Assessment  Last BM:  09/08/21 medium type 6  Height:   Ht Readings from Last 1 Encounters:  09/05/21 5\' 6"  (1.676 m)    Weight:   Wt Readings from Last 1 Encounters:  09/09/21 92.1 kg    BMI:  Body mass index is 32.77 kg/m.  Estimated Nutritional Needs:   Kcal:  2000-2200  Protein:  100-120 grams  Fluid:  >/= 2.0 L    14/08/22, MS, RD, LDN Inpatient Clinical Dietitian Please see AMiON for contact information.

## 2021-09-09 NOTE — Plan of Care (Signed)

## 2021-09-09 NOTE — Progress Notes (Signed)
NAME:  Annette [name], MRN:  [id number], DOB:  May 20, 1991, LOS: 3 ADMISSION DATE:  09/06/2021, CONSULTATION DATE:  12/5 REFERRING MD:  Rubin Payor, CHIEF COMPLAINT:  AMS, hemoptysis    History of Present Illness:  Annette [name] is a 30 yo female with uncontrolled T2DM, previous DVT, and recent submassive PE (Oct 2022) who presented with altered mental status and tachypnea. Patient was seen in the ED the day prior on 12/4 for hemoptysis/hematemesis and HR in the 140s. However, she left AMA at that time after only getting IVF, did not get labs or any imaging performed. She was A&O x 4 on 12/4 prior to leaving AMA. Today, on 12/5, the patient presented via EMS for worsening altered mental status. She was tachypneic on arrival and alert and oriented to self only and intermittently following commands. History obtained from chart review, as patient is unable to participate in interview. Initial CBG >600.   Pertinent  Medical History  T2DM, DVT RLE, submassive PE (x2, first attributed to COVID, second unprovoked)  Significant Hospital Events: Including procedures, antibiotic start and stop dates in addition to other pertinent events   12/5 admitted to ICU for DKA, dehydration, AKI, poor access s/p R femoral CVL, +UTI/ Bcx + e.coli 12/6 off insulin gtt, still encephalopathic, rocephin 12/8: off all infusions with exception heparin gtt  Interim History / Subjective:   12/8: off precedex infusion. Mother at bedside and updated as well. Pt still nauseous. Describes anxiety and depression at baseline interested in ssri. Nephrology increasing her ivf, uop actually increased to just shy of a liter/24hr and urine appears less muddy.  Objective   Blood pressure 127/85, pulse (!) 115, temperature 98.8 F (37.1 C), temperature source Oral, resp. rate (!) 27, weight 92.1 kg, SpO2 97 %.        Intake/Output Summary (Last 24 hours) at 09/09/2021 0919 Last data filed at 09/09/2021 0700 Gross per 24 hour  Intake  2640.79 ml  Output 953 ml  Net 1687.79 ml   Filed Weights   09/07/21 0519 09/08/21 0453 09/09/21 0500  Weight: 87.3 kg 89.1 kg 92.1 kg     General:  ill appearing young female reclining comfortably in bed in nad HEENT: MM pink/moist, pupils 3/reactive Neuro:  conversant, aaox4. Moves all 4 spontaneously CV: ST 115's, no murmur PULM:  non-labored, ctab on RA GI: soft, bs+, NT, foley- clearing urine Extremities: warm/dry, no LE edema  Skin: no rashes   Resolved Hospital Problem list   N/A  Assessment & Plan:  DKA Uncontrolled T2DM Most recent A1c 13.3. Home meds include lantus 30u daily and metformin 500 mg daily, although unsure if patient has been adherent with these meds.  Initial glucose 1168 with AG 27.  Likely precipitated by UTI/ bacteremia  - off insulin gtt 12/6 -bs improved. Advance diet as tolerated -pt denies seeing optho yearly and states has chronic vision issues. Recommended obvious better control of bs, but also routine f/u for diabetics is important and this was stressed to pt including yearly eye exams.   Nausea:  -chronic -pt would likely benefit from emptying study as outpt for gastroparesis eval.  -at this time will start some lower dose reglan to help -cont with anti emetics, electrolyte derrangements are being replaced and treated as able but her po status has been poor for some time.   Anxiety/depression:  -start zoloft.   E. Coli bacteremia secondary to UTI Sepsis, POA - remains normotensive.  Has not required pressors.  - de-escalate to  ancef 2/2 sensitivities  Acute metabolic encephalopathy likely 2/2 to DKA and sepsis  - supportive care as above, correcting metabolic derangements  -improved  Anion gap metabolic acidosis 2/2 DKA- resolved Lactic acidosis AKI, oliguric  Hypernatremia Hyperchloremic metabolic acidosis  -Cr appears to be stabilizing around 6-7 today. Bun to 80.  -however urine appears improved and output is improved  somewhat -suspect she will cont to improve and hopefully avoid dialysis -it was stressed to her the importance of good control of her diabetes in order to protect kidney's as best as possible from rapid progression to renal failure from diabetic nephropathy  Elevated LFTs improved  Hx of PE/DVT Most recent PE in October, although unsure if patient has been adherent with Xarelto since then.  - cont heparin until we see improvement in renal function....  -hgb is trending down but so are other parameters. Unable to order blue platelets to eval for thrombocytopenia in light of platelet clumping noted on cbc. No overt bleeding  - transition back to Xarelto prior to d/c and will need f/u with hematology for further testing and likely lifelong [facility] given this is her second PE  Hemoptysis No further evidence / episodes of hemoptysis/hematemesis.  - monitor CBC/ bleeding closely   At risk for malnutrition -challenging to take po with nausea -concern for gastroparesis as this is reportedly a chronic issue and with a1c consistently >12  -cont what we can, may need post pyloric cortrak tomorrow when team is here if still not improved.   Best Practice (right click and "Reselect all SmartList Selections" daily)   Diet/type: NPO  DVT prophylaxis: systemic heparin GI prophylaxis: PPI Lines: removed Foley:  Yes, and it is still needed Code Status:  full code Last date of multidisciplinary goals of care discussion:  Full Code.  Parents, Annette [name] [phone] and father Annette [name] [phone].   Mother updated at bedside 12/8   Labs   CBC: Recent Labs  Lab 09/06/21 1210 09/06/21 2131 09/07/21 0400 09/08/21 0445 09/09/21 0642  WBC 21.2*  --  13.4* 11.5* 8.4  NEUTROABS 18.9*  --   --   --   --   HGB 15.3* 14.3 12.1 11.3* 10.7*  HCT 51.7* 42.0 37.2 32.7* 31.9*  MCV 97.0  --  86.1 83.2 84.4  PLT 226  --  158 PLATELET CLUMPS NOTED ON SMEAR, UNABLE TO ESTIMATE PLATELET CLUMPS NOTED ON SMEAR, UNABLE  TO ESTIMATE    Basic Metabolic Panel: Recent Labs  Lab 09/07/21 0400 09/07/21 0835 09/08/21 0445 09/08/21 1554 09/09/21 0634  NA 149* 153* 151* 153* 153*  K 4.2 4.1 4.6 4.4 4.5  CL 111 124* 121* 121* 124*  CO2 19* 19* 18* 17* 16*  GLUCOSE 207* 164* 310* 175* 150*  BUN 41* 45* 61* 69* 81*  CREATININE 3.05* 3.53* 6.22* 6.69* 6.86*  CALCIUM 9.3 9.6 8.5* 8.6* 8.1*  MG  --  2.3  --   --   --   PHOS  --   --   --   --  <1.0*   GFR: Estimated Creatinine Clearance: 14 mL/min (A) (by C-G formula based on SCr of 6.86 mg/dL (H)). Recent Labs  Lab 09/06/21 1131 09/06/21 1210 09/06/21 1331 09/07/21 0400 09/08/21 0445 09/09/21 0642  WBC  --  21.2*  --  13.4* 11.5* 8.4  LATICACIDVEN 4.0*  --  4.0*  --   --   --     Liver Function Tests: Recent Labs  Lab 09/06/21 1210 09/08/21 0445 09/09/21  0634  AST 64* 23  --   ALT 31 28  --   ALKPHOS 185* 113  --   BILITOT 3.5* 0.4  --   PROT 9.2* 5.9*  --   ALBUMIN 2.9* 1.6* <1.5*   No results for input(s): LIPASE, AMYLASE in the last 168 hours. No results for input(s): AMMONIA in the last 168 hours.  ABG    Component Value Date/Time   PHART 7.397 09/06/2021 2131   PCO2ART 24.0 (L) 09/06/2021 2131   PO2ART 99 09/06/2021 2131   HCO3 14.7 (L) 09/06/2021 2131   TCO2 15 (L) 09/06/2021 2131   ACIDBASEDEF 8.0 (H) 09/06/2021 2131   O2SAT 98.0 09/06/2021 2131     Coagulation Profile: No results for input(s): INR, PROTIME in the last 168 hours.  Cardiac Enzymes: Recent Labs  Lab 09/08/21 1553  CKTOTAL 65    HbA1C: Hgb A1c MFr Bld  Date/Time Value Ref Range Status  09/07/2021 04:00 AM 12.6 (H) 4.8 - 5.6 % Final    Comment:    (NOTE) Pre diabetes:          5.7%-6.4%  Diabetes:              >6.4%  Glycemic control for   <7.0% adults with diabetes   07/29/2021 06:05 PM 13.3 (H) 4.8 - 5.6 % Final    Comment:    (NOTE) Pre diabetes:          5.7%-6.4%  Diabetes:              >6.4%  Glycemic control for    <7.0% adults with diabetes     CBG: Recent Labs  Lab 09/08/21 1540 09/08/21 1940 09/08/21 2327 09/09/21 0344 09/09/21 0832  GLUCAP 161* 162* 201* 166* 130*    Critical care time: The patient is critically ill with multiple organ systems failure and requires high complexity decision making for assessment and support, frequent evaluation and titration of therapies, application of advanced monitoring technologies and extensive interpretation of multiple databases.  Critical care time 38 mins. This represents my time independent of the NPs time taking care of the pt. This is excluding procedures.    Briant Sites DO [facility] 09/09/2021, 9:24 AM See Amion for pager If no response to pager, please call [phone] until 1900 After 1900 please call [facility] [phone]

## 2021-09-10 ENCOUNTER — Inpatient Hospital Stay (HOSPITAL_COMMUNITY): Payer: Self-pay

## 2021-09-10 DIAGNOSIS — B9620 Unspecified Escherichia coli [E. coli] as the cause of diseases classified elsewhere: Secondary | ICD-10-CM

## 2021-09-10 DIAGNOSIS — R7881 Bacteremia: Secondary | ICD-10-CM

## 2021-09-10 DIAGNOSIS — E43 Unspecified severe protein-calorie malnutrition: Secondary | ICD-10-CM | POA: Diagnosis present

## 2021-09-10 LAB — RENAL FUNCTION PANEL
Albumin: <1.5 g/dL — ABNORMAL LOW (ref 3.5–5.0)
Anion gap: 13 (ref 5–15)
BUN: 82 mg/dL — ABNORMAL HIGH (ref 6–20)
CO2: 16 mmol/L — ABNORMAL LOW (ref 22–32)
Calcium: 7.1 mg/dL — ABNORMAL LOW (ref 8.9–10.3)
Chloride: 123 mmol/L — ABNORMAL HIGH (ref 98–111)
Creatinine, Ser: 6.43 mg/dL — ABNORMAL HIGH (ref 0.44–1.00)
GFR, Estimated: 8 mL/min — ABNORMAL LOW (ref >60)
Glucose, Bld: 93 mg/dL (ref 70–99)
Phosphorus: 2.2 mg/dL — ABNORMAL LOW (ref 2.5–4.6)
Potassium: 3.7 mmol/L (ref 3.5–5.1)
Sodium: 152 mmol/L — ABNORMAL HIGH (ref 135–145)

## 2021-09-10 LAB — PHOSPHORUS
Phosphorus: 2.2 mg/dL — ABNORMAL LOW (ref 2.5–4.6)
Phosphorus: 3.4 mg/dL (ref 2.5–4.6)

## 2021-09-10 LAB — ANCA PROFILE
Anti-MPO Antibodies: <0.2 units (ref 0.0–0.9)
Anti-PR3 Antibodies: <0.2 units (ref 0.0–0.9)
Atypical P-ANCA titer: <1:20 {titer}
C-ANCA: <1:20 {titer}
P-ANCA: <1:20 {titer}

## 2021-09-10 LAB — GLUCOSE, CAPILLARY
Glucose-Capillary: 101 mg/dL — ABNORMAL HIGH (ref 70–99)
Glucose-Capillary: 100 mg/dL — ABNORMAL HIGH (ref 70–99)
Glucose-Capillary: 159 mg/dL — ABNORMAL HIGH (ref 70–99)
Glucose-Capillary: 244 mg/dL — ABNORMAL HIGH (ref 70–99)
Glucose-Capillary: 298 mg/dL — ABNORMAL HIGH (ref 70–99)
Glucose-Capillary: 247 mg/dL — ABNORMAL HIGH (ref 70–99)

## 2021-09-10 LAB — C4 COMPLEMENT: Complement C4, Body Fluid: 30 mg/dL (ref 12–38)

## 2021-09-10 LAB — CBC
HCT: 31.7 % — ABNORMAL LOW (ref 36.0–46.0)
Hemoglobin: 10.6 g/dL — ABNORMAL LOW (ref 12.0–15.0)
MCH: 28.2 pg (ref 26.0–34.0)
MCHC: 33.4 g/dL (ref 30.0–36.0)
MCV: 84.3 fL (ref 80.0–100.0)
Platelets: 96 10*3/uL — ABNORMAL LOW (ref 150–400)
RBC: 3.76 MIL/uL — ABNORMAL LOW (ref 3.87–5.11)
RDW: 14.7 % (ref 11.5–15.5)
WBC: 8.8 10*3/uL (ref 4.0–10.5)
nRBC: 0 % (ref 0.0–0.2)

## 2021-09-10 LAB — HEPARIN LEVEL (UNFRACTIONATED)
Heparin Unfractionated: 0.19 IU/mL — ABNORMAL LOW (ref 0.30–0.70)
Heparin Unfractionated: 0.39 IU/mL (ref 0.30–0.70)

## 2021-09-10 LAB — C3 COMPLEMENT: C3 Complement: 121 mg/dL (ref 82–167)

## 2021-09-10 LAB — ANTI-DNA ANTIBODY, DOUBLE-STRANDED: ds DNA Ab: 1 IU/mL (ref 0–9)

## 2021-09-10 LAB — ANA W/REFLEX IF POSITIVE: Anti Nuclear Antibody (ANA): NEGATIVE

## 2021-09-10 LAB — MAGNESIUM
Magnesium: 2.4 mg/dL (ref 1.7–2.4)
Magnesium: 2.2 mg/dL (ref 1.7–2.4)

## 2021-09-10 LAB — RHEUMATOID FACTOR: Rheumatoid fact SerPl-aCnc: 15.4 IU/mL — ABNORMAL HIGH (ref <14.0)

## 2021-09-10 LAB — PROTEIN / CREATININE RATIO, URINE
Creatinine, Urine: 34.35 mg/dL
Protein Creatinine Ratio: 0.79 mg/mg{Cre} — ABNORMAL HIGH (ref 0.00–0.15)
Total Protein, Urine: 27 mg/dL

## 2021-09-10 LAB — NA AND K (SODIUM & POTASSIUM), RAND UR
Potassium Urine: 11 mmol/L
Sodium, Ur: 66 mmol/L

## 2021-09-10 MED ORDER — STERILE WATER FOR INJECTION IV SOLN
INTRAVENOUS | Status: DC
  Filled 2021-09-10 (×2): qty 1000

## 2021-09-10 MED ORDER — FREE WATER
200.0000 mL | Status: DC
  Administered 2021-09-10 – 2021-09-11 (×5): 200 mL

## 2021-09-10 MED ORDER — FREE WATER: 400.0000 mL | Status: DC

## 2021-09-10 MED ORDER — POTASSIUM PHOSPHATES 15 MMOLE/5ML IV SOLN
20.0000 mmol | Freq: Once | INTRAVENOUS | Status: AC
  Administered 2021-09-10: 20 mmol via INTRAVENOUS
  Filled 2021-09-10: qty 6.67

## 2021-09-10 MED ORDER — FREE WATER: 200.0000 mL | Status: DC

## 2021-09-10 MED ORDER — SERTRALINE HCL 50 MG PO TABS
50.0000 mg | ORAL_TABLET | Freq: Every day | ORAL | Status: DC
Start: 2021-09-10 — End: 2021-09-12
  Administered 2021-09-10: 50 mg
  Filled 2021-09-10 (×3): qty 1

## 2021-09-10 MED ORDER — ALBUMIN HUMAN 25 % IV SOLN
25.0000 g | Freq: Four times a day (QID) | INTRAVENOUS | Status: AC
  Administered 2021-09-10 (×3): 25 g via INTRAVENOUS
  Filled 2021-09-10 (×3): qty 100

## 2021-09-10 NOTE — Procedures (Signed)
Cortrak  Tube Type:  Cortrak - 43 inches Tube Location:  Left nare Initial Placement:  Postpyloric Secured by: Bridle Technique Used to Measure Tube Placement:  Marking at nare/corner of mouth Cortrak Secured At:  95 cm   Cortrak Tube Team Note:  Consult received to place a Cortrak feeding tube.   X-ray is required, abdominal x-ray has been ordered by the Cortrak team. Please confirm tube placement before using the Cortrak tube.   If the tube becomes dislodged please keep the tube and contact the Cortrak team at [email or web address] ([email or web address]) for replacement.  If after hours and replacement cannot be delayed, place a NG tube and confirm placement with an abdominal x-ray.   Zamariyah Furukawa MS, RD, LDN Please refer to AMION for RD and/or RD on-call/weekend/after hours pager   

## 2021-09-10 NOTE — Hospital Course (Addendum)
Annette [name] is a 30 y.o. female presenting with Altered Mental Status, DKA and bacteremia. PMHx is significant for poorly controlled T2DM, submassive PE x2 (October 2022, 1st COVID, 2nd unprovoked), previous DVT RLE  ED Course: Patient presented via EMS 12/4 for hemoptysis/hematemesis while on anticoagulation and left AMA, then returned via EMS 12/5 for AMS, DKA, tachypnea and bacteremia.  ICU Course: Admitted to ICU 12/5 for DKA, dehydration, AKI, poor access s/p femoral CVL, +UTI/ Bcx + e.coli. AG closed, received Rocephin (12/6-12/8), and on heparin gtt, then started IV cefazolin.   Brief Hospital Course: Continued IV cefazolin. Consulted nephrology for management of nausea, hypernatremia and acute renal failure with regular labs, IVF and PO intake. Patient then left without completing treatment  Consults: PCCM, Nephro, Psych  Plan by Problem: Uncontrolled T2DM  DKA, RESOLVED Acute metabolic encephalopathy, RESOLVED  A1c 12.6 (09/07/2021), last CBG 130 (IP CBG goal <180). Resistant SSI + Levemir 15 units BID during hospitalization CGBs  Instructed patient to continue with detemir 15 units BID   Hypernatremia  Possible DI  Na+ 150-154 for about 1wk.  Mild hypokalemia 2/2 insulin which was repleted. Suspect 2/2 corrected severe hyperglycemia. Encouraging patient to increase p.o. intake.  Per nephrology, encourage patient to drink free water    Non-oliguric acute renal failure  hypocalcemia Likely ATN 2/2 DKA/osmotic diuresis, decreased oral intake, sepsis. Cr 3.46 on admission, Cr 6.0 in the ICU, baseline Cr 0.7.  Kidney function continue improvements with adequate p.o. intake and supplemental IVF, no indication for dialysis. Cr (!) 2.74 on day of AMA leave. corrected Ca2+ 8.9 2/2 AKI and expected to improve with improving renal function. Autoimmune and vasculitis work-up negative No acidosis, continues to be mildly hypokalemic possibly 2/2 insulin  MDD/GAD  SI/HI Patient  endorsed SI/HI during hospitalization started on zoloft, however she refused it x2 days, so it was d/c'd.  Psychiatry consulted then signed off 1:1 sitter for SI/HI, elopement  Issues for Follow Up:  T2DM.  Patient presented for DKA, currently on detemir 15 units BID and metformin 500 mg XR daily Hx of PE/DVT.  On Xarelto 20 mg daily with supper AKI & hypernatremia 2/2 DKA/osmotic diuresis, severe dehydration.  Please follow up with BMP  Significant Procedures:  Intubated

## 2021-09-10 NOTE — Progress Notes (Addendum)
ANTICOAGULATION CONSULT NOTE  Pharmacy Consult:  Heparin Indication: pulmonary embolus and DVT  No Known Allergies  Patient Measurements: Weight: 92.1 kg (203 lb 0.7 oz) Heparin Dosing Weight:  78 kg  Vital Signs: Temp: 97.6 F (36.4 C) (12/09 1150) Temp Source: Oral (12/09 1150) BP: 135/86 (12/09 1300) Pulse Rate: 101 (12/09 1400)  Labs: Recent Labs    09/07/21 1754 09/08/21 0445 09/08/21 1553 09/08/21 1554 09/09/21 0634 09/09/21 0642 09/09/21 1454 09/09/21 1806 09/10/21 0336 09/10/21 1344  HGB  --    < >  --   --   --  10.7* 11.1*  --  10.6*  --   HCT  --    < >  --   --   --  31.9* 32.4*  --  31.7*  --   PLT  --    < >  --   --   --  PLATELET CLUMPS NOTED ON SMEAR, UNABLE TO ESTIMATE 102*  --  96*  --   APTT 37*  --   --   --   --   --   --   --   --   --   HEPARINUNFRC  --    < >  --   --   --  0.21*  --  0.19* 0.19* 0.39  CREATININE  --    < >  --  6.69* 6.86*  --   --   --  6.43*  --   CKTOTAL  --   --  65  --   --   --   --   --   --   --    < > = values in this interval not displayed.     Estimated Creatinine Clearance: 14.9 mL/min (A) (by C-G formula based on SCr of 6.43 mg/dL (H)).   Assessment: 47 YOF presented on 12/5 with AMS, DKA and bacteremia.  Patient has a history of PE in October 2022 and DVT.  She was seen in the ED on 12/4 for hemoptysis/hematemesis and left AMA.  No hemoptysis observed thus far this admission and CBC is stable.  Pharmacy consulted for IV heparin dosing.    Heparin level therapeutic at 0.39 units/mL (footstick sample per patient's preference).  No issue with infusion nor overt bleeding per RN.  Platelet count is dropping and 4T score indicates low to intermediate risk for HIT.  Goal of Therapy:  Heparin level 0.3-0.7 units/ml Monitor platelets by anticoagulation protocol: Yes   Plan:  Continue heparin gtt at 2000 units/hr, no bolus with recent hemoptysis Daily heparin level and CBC (monitor plts closely) Monitor closely  for s/sx of bleeding  Shubham Thackston D. Laney Potash, PharmD, BCPS, BCCCP 09/10/2021, 2:42 PM

## 2021-09-10 NOTE — Evaluation (Signed)
Physical Therapy Evaluation Patient Details Name: Annette [name] MRN: [id number] DOB: May 03, 1991 Today's Date: 09/10/2021  History of Present Illness  30 yo admitted 12/5 with AMS, tachypnea with DKA. Pt presented to ED 12/4 with hematemesis but left AMA. PMhx: DVT, PE (Oct 2022), uncontrolled DM  Clinical Impression  Pt confused with decreased safety, processing and awareness. Pt impulsive with sitting spontaneously x 2 during session. Pt reports she is normally independent and climbs 2 flights of stairs. Pt with decreased balance, safety, transfers, gait, cognition and function who will benefit from acute therapy to maximize mobility, safety and independence.        Recommendations for follow up therapy are one component of a multi-disciplinary discharge planning process, led by the attending physician.  Recommendations may be updated based on patient status, additional functional criteria and insurance authorization.  Follow Up Recommendations Home health PT    Assistance Recommended at Discharge Frequent or constant Supervision/Assistance  Functional Status Assessment Patient has had a recent decline in their functional status and demonstrates the ability to make significant improvements in function in a reasonable and predictable amount of time.  Equipment Recommendations  Rolling walker (2 wheels);BSC/3in1    Recommendations for Other Services       Precautions / Restrictions Precautions Precautions: Fall Precaution Comments: cortrak      Mobility  Bed Mobility Overal bed mobility: Needs Assistance Bed Mobility: Supine to Sit     Supine to sit: HOB elevated;Min assist     General bed mobility comments: HOB 35 degrees with use of rail and HHA to elevate trunk from surface with increased time. Pt with initial "flop" over to right with sitting with cues pt able to return to midline sitting    Transfers Overall transfer level: Needs assistance   Transfers: Sit to/from  Stand;Bed to chair/wheelchair/BSC Sit to Stand: Min guard;Min assist   Step pivot transfers: Min assist       General transfer comment: initial stand with min assist with pt with impulsive movement and grabbing at therapist for support then impulsive sit to EOB. 2nd stand with min assist and bil UE support on therapist to step and pivot to chair. 2 additional stands from chair with minguard with mod cues for hand placement and safety and again impulsive sit at chair    Ambulation/Gait Ambulation/Gait assistance: Min assist Gait Distance (Feet): 55 Feet Assistive device: Rolling walker (2 wheels) Gait Pattern/deviations: Step-through pattern;Decreased stride length   Gait velocity interpretation: 1.31 - 2.62 ft/sec, indicative of limited community ambulator   General Gait Details: pt with varied hand placement on RW and position in RW with min assist to control RW with mod cues for safety and sequence. pt with unsteady gait and decreased awareness  Stairs            Wheelchair Mobility    Modified Rankin (Stroke Patients Only)       Balance Overall balance assessment: Needs assistance   Sitting balance-Leahy Scale: Poor Sitting balance - Comments: initial LOB with sitting then able to recover to minguard   Standing balance support: Bilateral upper extremity supported Standing balance-Leahy Scale: Poor Standing balance comment: bil UE support on therapist or RW for standing                             Pertinent Vitals/Pain Pain Assessment: Faces Pain Score: 3  Pain Location: generalized Pain Descriptors / Indicators: Sore Pain Intervention(s): Repositioned;Monitored during session  Home Living Family/patient expects to be discharged to:: Private residence Living Arrangements: Spouse/significant other Available Help at Discharge: Friend(s);Available PRN/intermittently Type of Home: Apartment Home Access: Stairs to enter Entrance Stairs-Rails:  Doctor, general practice of Steps: 2 flights   Home Layout: One level Home Equipment: Agricultural consultant (2 wheels)      Prior Function Prior Level of Function : Independent/Modified Independent             Mobility Comments: pt reports independence, shopping and able to climb stairs       Hand Dominance        Extremity/Trunk Assessment   Upper Extremity Assessment Upper Extremity Assessment: Generalized weakness    Lower Extremity Assessment Lower Extremity Assessment: Generalized weakness    Cervical / Trunk Assessment Cervical / Trunk Assessment: Normal  Communication   Communication: No difficulties  Cognition Arousal/Alertness: Awake/alert Behavior During Therapy: Flat affect Overall Cognitive Status: Impaired/Different from baseline Area of Impairment: Attention;Memory;Following commands;Safety/judgement;Problem solving                   Current Attention Level: Sustained Memory: Decreased short-term memory Following Commands: Follows one step commands inconsistently Safety/Judgement: Decreased awareness of safety;Decreased awareness of deficits   Problem Solving: Slow processing General Comments: pt oriented and able to state president and spell world backward but required 2 tries for simple money management. Flat affect with slow processing        General Comments      Exercises     Assessment/Plan    PT Assessment Patient needs continued PT services  PT Problem List Decreased activity tolerance;Decreased balance;Decreased cognition;Decreased knowledge of use of DME;Decreased strength;Decreased mobility;Decreased safety awareness       PT Treatment Interventions Gait training;Balance training;Stair training;Functional mobility training;Cognitive remediation;Therapeutic activities;Patient/family education;Neuromuscular re-education;Therapeutic exercise;DME instruction    PT Goals (Current goals can be found in the Care Plan  section)  Acute Rehab PT Goals Patient Stated Goal: go to [place] in [place] PT Goal Formulation: With patient Time For Goal Achievement: 09/17/21 Potential to Achieve Goals: Fair    Frequency Min 3X/week   Barriers to discharge Decreased caregiver support      Co-evaluation               AM-PAC PT "6 Clicks" Mobility  Outcome Measure Help needed turning from your back to your side while in a flat bed without using bedrails?: A Little Help needed moving from lying on your back to sitting on the side of a flat bed without using bedrails?: A Little Help needed moving to and from a bed to a chair (including a wheelchair)?: A Lot Help needed standing up from a chair using your arms (e.g., wheelchair or bedside chair)?: A Lot Help needed to walk in hospital room?: A Lot Help needed climbing 3-5 steps with a railing? : Total 6 Click Score: 13    End of Session Equipment Utilized During Treatment: Gait belt Activity Tolerance: Patient tolerated treatment well Patient left: in chair;with call bell/phone within reach;with chair alarm set Nurse Communication: Mobility status PT Visit Diagnosis: Other abnormalities of gait and mobility (R26.89);Muscle weakness (generalized) (M62.81);Other symptoms and signs involving the nervous system (R29.898)    Time: 8921-1941 PT Time Calculation (min) (ACUTE ONLY): 34 min   Charges:   PT Evaluation $PT Eval Moderate Complexity: 1 Mod PT Treatments $Gait Training: 8-22 mins        Mylia Pondexter P, PT Acute Rehabilitation Services Pager: [phone] Office: [phone]   Marissa Lowrey B Shadoe Cryan 09/10/2021,  11:01 AM  

## 2021-09-10 NOTE — Progress Notes (Signed)
NAME:  Annette [name], MRN:  [id number], DOB:  24-Nov-1990, LOS: 4 ADMISSION DATE:  09/06/2021, CONSULTATION DATE:  12/5 REFERRING MD:  Rubin Payor, CHIEF COMPLAINT:  AMS, hemoptysis    History of Present Illness:  Annette [name] is a 30 yo female with uncontrolled T2DM, previous DVT, and recent submassive PE (Oct 2022) who presented with altered mental status and tachypnea. Patient was seen in the ED the day prior on 12/4 for hemoptysis/hematemesis and HR in the 140s. However, she left AMA at that time after only getting IVF, did not get labs or any imaging performed. She was A&O x 4 on 12/4 prior to leaving AMA. Today, on 12/5, the patient presented via EMS for worsening altered mental status. She was tachypneic on arrival and alert and oriented to self only and intermittently following commands. History obtained from chart review, as patient is unable to participate in interview. Initial CBG >600.   Pertinent  Medical History  T2DM, DVT RLE, submassive PE (x2, first attributed to COVID, second unprovoked)  Significant Hospital Events: Including procedures, antibiotic start and stop dates in addition to other pertinent events   12/5 admitted to ICU for DKA, dehydration, AKI, poor access s/p R femoral CVL, +UTI/ Bcx + e.coli 12/6 off insulin gtt, still encephalopathic, rocephin 12/8: off all infusions with exception heparin gtt  Interim History / Subjective:   Urine output up to 3200 cc over the last 24 hours with some improvement in serum creatinine Hypernatremic, I/O+ 10.5 L total Remains on heparin infusion Room air Has continued to have nausea with emesis, did not tolerate clear liquids this morning Refused her cefazolin because she was concerned it was causing nausea   Objective   Blood pressure 128/84, pulse 97, temperature 98.5 F (36.9 C), temperature source Oral, resp. rate 17, weight 92.1 kg, SpO2 99 %.        Intake/Output Summary (Last 24 hours) at 09/10/2021 7035 Last data  filed at 09/10/2021 0600 Gross per 24 hour  Intake 4937.42 ml  Output 3202 ml  Net 1735.42 ml   Filed Weights   09/07/21 0519 09/08/21 0453 09/09/21 0500  Weight: 87.3 kg 89.1 kg 92.1 kg     General: Obese woman, laying in bed, no distress HEENT: Oropharynx clear, pupils equal Neuro: Awake, alert, interacts without any respiratory distress.  Poor insight into her disease.  Does follow commands.  Good strength CV: Regular, distant, no murmur PULM: Clear bilaterally GI: Obese, nondistended.  Positive bowel sounds.  Mild diffuse tenderness without rebound or guarding Extremities: No edema Skin: No rash  Resolved Hospital Problem list   N/A  Assessment & Plan:  DKA Uncontrolled T2DM Most recent A1c 13.3. Home meds include lantus 30u daily and metformin 500 mg daily, although unsure if patient has been adherent with these meds.  Initial glucose 1168 with AG 27.  Likely precipitated by UTI/ bacteremia  -Off insulin infusion, blood sugars improved -Remains n.p.o. due to nausea, emesis -Levemir 15 units twice daily -Sliding scale insulin per protocol -We will need to reinitiate Ophtha evaluation as an outpatient  Nausea:  -Acute on chronic, severe.  Did not tolerate clears on 12/9 -Place core track tube postpyloric today 12/9 for nutrition and meds -Will benefit from a gastroparesis evaluation, emptying study once stabilized -Continue antiemetics -Started Zoloft on 12/8 Anxiety/depression:  -start zoloft.   E. Coli bacteremia secondary to UTI Sepsis, POA -Continue cefazolin, plan for 7 days antibiotics total  Acute metabolic encephalopathy likely 2/2 to DKA and sepsis  -  Overall improved although her insight into her disease is poor  Anion gap metabolic acidosis 2/2 DKA- resolved Lactic acidosis AKI, oliguric  Hypernatremia Hyperchloremic metabolic acidosis  -Slowly improving, serum creatinine 6.43 with a nongap metabolic acidosis -Follow urine output, BMP closely -Free  water -Avoid nephrotoxins and maintain adequate perfusion pressures -Again will need close outpatient follow-up as her renal dysfunction is directly related to her poorly controlled diabetes   Hx of PE/DVT Most recent PE in October, although unsure if patient has been adherent with Xarelto since then.  -Managing with heparin infusion.  Attempt to transition back to DOAC once renal function improved -Follow CBC, follow for any evidence acute blood loss  Hemoptysis, resolved-continue to follow No further evidence / episodes of hemoptysis/hematemesis.  - monitor CBC/ bleeding closely   At risk for malnutrition -Has not been able to tolerate p.o. due to emesis -Plan for gastroparesis evaluation as above -Initiate tube feeding when NG tube in place   Best Practice (right click and "Reselect all SmartList Selections" daily)   Diet/type: clear liquids  DVT prophylaxis: systemic heparin GI prophylaxis: PPI Lines: removed Foley:  Yes, and it is still needed Code Status:  full code Last date of multidisciplinary goals of care discussion:  Full Code.  Parents, Victorino Dike [phone] and father Caryn Bee [phone].   Mother updated at bedside 12/9   Labs   CBC: Recent Labs  Lab 09/06/21 1210 09/06/21 2131 09/07/21 0400 09/08/21 0445 09/09/21 0642 09/09/21 1454 09/10/21 0336  WBC 21.2*  --  13.4* 11.5* 8.4 8.8 8.8  NEUTROABS 18.9*  --   --   --   --   --   --   HGB 15.3*   < > 12.1 11.3* 10.7* 11.1* 10.6*  HCT 51.7*   < > 37.2 32.7* 31.9* 32.4* 31.7*  MCV 97.0  --  86.1 83.2 84.4 83.9 84.3  PLT 226  --  158 PLATELET CLUMPS NOTED ON SMEAR, UNABLE TO ESTIMATE PLATELET CLUMPS NOTED ON SMEAR, UNABLE TO ESTIMATE 102* 96*   < > = values in this interval not displayed.    Basic Metabolic Panel: Recent Labs  Lab 09/07/21 0835 09/08/21 0445 09/08/21 1554 09/09/21 0634 09/10/21 0336  NA 153* 151* 153* 153* 152*  K 4.1 4.6 4.4 4.5 3.7  CL 124* 121* 121* 124* 123*  CO2 19* 18* 17*  16* 16*  GLUCOSE 164* 310* 175* 150* 93  BUN 45* 61* 69* 81* 82*  CREATININE 3.53* 6.22* 6.69* 6.86* 6.43*  CALCIUM 9.6 8.5* 8.6* 8.1* 7.1*  MG 2.3  --   --   --  2.4  PHOS  --   --   --  <1.0* 2.2*  2.2*   GFR: Estimated Creatinine Clearance: 14.9 mL/min (A) (by C-G formula based on SCr of 6.43 mg/dL (H)). Recent Labs  Lab 09/06/21 1131 09/06/21 1210 09/06/21 1331 09/07/21 0400 09/08/21 0445 09/09/21 0642 09/09/21 1454 09/10/21 0336  WBC  --    < >  --    < > 11.5* 8.4 8.8 8.8  LATICACIDVEN 4.0*  --  4.0*  --   --   --   --   --    < > = values in this interval not displayed.    Liver Function Tests: Recent Labs  Lab 09/06/21 1210 09/08/21 0445 09/09/21 0634 09/10/21 0336  AST 64* 23  --   --   ALT 31 28  --   --   ALKPHOS 185* 113  --   --  BILITOT 3.5* 0.4  --   --   PROT 9.2* 5.9*  --   --   ALBUMIN 2.9* 1.6* <1.5* <1.5*   No results for input(s): LIPASE, AMYLASE in the last 168 hours. No results for input(s): AMMONIA in the last 168 hours.  ABG    Component Value Date/Time   PHART 7.397 09/06/2021 2131   PCO2ART 24.0 (L) 09/06/2021 2131   PO2ART 99 09/06/2021 2131   HCO3 14.7 (L) 09/06/2021 2131   TCO2 15 (L) 09/06/2021 2131   ACIDBASEDEF 8.0 (H) 09/06/2021 2131   O2SAT 98.0 09/06/2021 2131     Coagulation Profile: No results for input(s): INR, PROTIME in the last 168 hours.  Cardiac Enzymes: Recent Labs  Lab 09/08/21 1553  CKTOTAL 65    HbA1C: Hgb A1c MFr Bld  Date/Time Value Ref Range Status  09/07/2021 04:00 AM 12.6 (H) 4.8 - 5.6 % Final    Comment:    (NOTE) Pre diabetes:          5.7%-6.4%  Diabetes:              >6.4%  Glycemic control for   <7.0% adults with diabetes   07/29/2021 06:05 PM 13.3 (H) 4.8 - 5.6 % Final    Comment:    (NOTE) Pre diabetes:          5.7%-6.4%  Diabetes:              >6.4%  Glycemic control for   <7.0% adults with diabetes     CBG: Recent Labs  Lab 09/09/21 1102 09/09/21 1519  09/09/21 1946 09/09/21 2312 09/10/21 0443  GLUCAP 118* 226* 162* 102* 101*    Independent CC time 31 minutes   Levy Pupa, MD, PhD 09/10/2021, 8:16 AM [facility] [phone] or if no answer before 7:00PM call [phone] For any issues after 7:00PM please call eLink [phone]

## 2021-09-10 NOTE — Progress Notes (Addendum)
[facility] NEPHROLOGY PROGRESS NOTE  Assessment/ Plan:  Pt is a 30 y.o. yo female with uncontrolled type II diabetes mellitus, previous DVT/PE (provoked and unprovoked) admitted 12/5 to ICU for acute metabolic encephalopathy in setting of diabetic ketoacidosis and acute renal failure.  #Oliguric acute renal failure, ATN related to severe dehydration, osmotic diuresis in setting of DKA, sepsis vs intrinsic cause - Over last 24h, urine output has significantly improved, 1.4 cc/kg/hr - Renal function stable/mildly improved today, sCr 6.43<6.86, GFR 8>8 - Autoimmune/vasculitis work-up pending - Will place Cortrak today, initiate free water for hypernatremia - Mentation remaining much improved from admission, no uremic signs today - Family and patient have been counseled about the possibility of temporary dialysis - no indication at this time. - Continue Foley catheter - Strict I/O, avoid nephrotoxins  #Hypovolemic hypernatremia - Na 152<153 with continued 1/2 normal saline - Free water deficit 3.9L today - Will switch to free water today w/ Cortrak - Daily BMP  #Nausea, vomiting - Appears chronic issue, likely related to long-standing diabetes - Would benefit from gastric emptying study - Metabolic acidosis possibly playing role, will start salt tablets - Management per primary  #Normocytic anemia - Stable, Hgb 10.6<10.7 - Likely in setting of acute illness, no active signs or symptoms of bleeding - Daily CBC  #Thrombocytopenia - Repeat CBC yesterday revealed PLT 102, down to 96 - On heparin gtt, management per primary  #Uncontrolled type II diabetes mellitus #Diabetic ketoacidosis - Sugars appear well-controlled on current regimen  #E. coli bacteremia - On Ancef, defer management to primary  #History of PE/DVT - Obtaining autoimmune work-up - On heparin gtt, management per primary  Subjective:    This morning patient reports continued nausea. Mentions  that the antibiotics have made her stomach upset. Otherwise overall feels weak.  Objective Vital signs in last 24 hours: Vitals:   09/10/21 0400 09/10/21 0500 09/10/21 0600 09/10/21 0700  BP: 130/85 (!) 147/81 (!) 143/95 128/84  Pulse: (!) 107 98 (!) 105 97  Resp: 15 10 (!) 23 17  Temp:      TempSrc:      SpO2: 97% 99% 99% 99%  Weight:       Weight change:   Intake/Output Summary (Last 24 hours) at 09/10/2021 0809 Last data filed at 09/10/2021 0600 Gross per 24 hour  Intake 4937.42 ml  Output 3202 ml  Net 1735.42 ml   Labs: Basic Metabolic Panel: Recent Labs  Lab 09/08/21 1554 09/09/21 0634 09/10/21 0336  NA 153* 153* 152*  K 4.4 4.5 3.7  CL 121* 124* 123*  CO2 17* 16* 16*  GLUCOSE 175* 150* 93  BUN 69* 81* 82*  CREATININE 6.69* 6.86* 6.43*  CALCIUM 8.6* 8.1* 7.1*  PHOS  --  <1.0* 2.2*  2.2*   Liver Function Tests: Recent Labs  Lab 09/06/21 1210 09/08/21 0445 09/09/21 0634 09/10/21 0336  AST 64* 23  --   --   ALT 31 28  --   --   ALKPHOS 185* 113  --   --   BILITOT 3.5* 0.4  --   --   PROT 9.2* 5.9*  --   --   ALBUMIN 2.9* 1.6* <1.5* <1.5*   No results for input(s): LIPASE, AMYLASE in the last 168 hours. No results for input(s): AMMONIA in the last 168 hours. CBC: Recent Labs  Lab 09/06/21 1210 09/06/21 2131 09/07/21 0400 09/08/21 0445 09/09/21 0642 09/09/21 1454 09/10/21 0336  WBC 21.2*  --  13.4* 11.5*  8.4 8.8 8.8  NEUTROABS 18.9*  --   --   --   --   --   --   HGB 15.3*   < > 12.1 11.3* 10.7* 11.1* 10.6*  HCT 51.7*   < > 37.2 32.7* 31.9* 32.4* 31.7*  MCV 97.0  --  86.1 83.2 84.4 83.9 84.3  PLT 226  --  158 PLATELET CLUMPS NOTED ON SMEAR, UNABLE TO ESTIMATE PLATELET CLUMPS NOTED ON SMEAR, UNABLE TO ESTIMATE 102* 96*   < > = values in this interval not displayed.   Cardiac Enzymes: Recent Labs  Lab 09/08/21 1553  CKTOTAL 65   CBG: Recent Labs  Lab 09/09/21 1102 09/09/21 1519 09/09/21 1946 09/09/21 2312 09/10/21 0443  GLUCAP 118*  226* 162* 102* 101*    Iron Studies: No results for input(s): IRON, TIBC, TRANSFERRIN, FERRITIN in the last 72 hours. Studies/Results: No results found.  Medications: Infusions:  sodium chloride 150 mL/hr at 09/10/21 0500    ceFAZolin (ANCEF) IV     famotidine (PEPCID) IV Stopped (09/09/21 1656)   feeding supplement (OSMOLITE 1.5 CAL)     heparin 2,000 Units/hr (09/10/21 0520)    Scheduled Medications:  Chlorhexidine Gluconate Cloth  6 each Topical Daily   feeding supplement  1 Container Oral TID BM   feeding supplement (PROSource TF)  45 mL Per Tube BID   insulin aspart  0-20 Units Subcutaneous Q4H   insulin detemir  15 Units Subcutaneous BID   mouth rinse  15 mL Mouth Rinse BID   metoCLOPramide (REGLAN) injection  5 mg Intravenous Q12H   pantoprazole (PROTONIX) IV  40 mg Intravenous QHS   sertraline  50 mg Oral Daily   sodium chloride flush  10-40 mL Intracatheter Q12H    have reviewed scheduled and prn medications.  Physical Exam: General: NAD, comfortable Heart: RRR, s1s2 nl Lungs: Clear b/l, no crackle Abdomen: Soft, non-tender, non-distended Extremities: No edema Neuro: Awake, alert, conversing appropriately.  Evlyn Kanner, MD Internal Medicine Resident PGY-2 09/10/2021,8:09 AM  LOS: 4 days

## 2021-09-10 NOTE — Evaluation (Signed)
Occupational Therapy Evaluation Patient Details Name: Annette [name] MRN: [id number] DOB: 1991-03-08 Today's Date: 09/10/2021   History of Present Illness 30 yo admitted 12/5 with AMS, tachypnea with DKA. Pt presented to ED 12/4 with hematemesis but left AMA. PMhx: DVT, PE (Oct 2022), uncontrolled DM   Clinical Impression   PTA patient reports independent with ADLs, mobility.  She reports not driving and being on leave at work Clinical research associate), her boyfriend assists with IADLs but she manages her own medications.  Admitted for above and limited by impaired cognition, weakness, balance and safety.  She is oriented, follows most simple commands but at times requires increased time and cueing, poor awareness to safety and deficits with poor problem solving.  She currently requires up to min assist for ADLs and transfers.  Believe she will best benefit from [facility] services at discharge, but due to cognition at this time recommend 24/7 support.  Will follow.      Recommendations for follow up therapy are one component of a multi-disciplinary discharge planning process, led by the attending physician.  Recommendations may be updated based on patient status, additional functional criteria and insurance authorization.   Follow Up Recommendations  Home health OT    Assistance Recommended at Discharge Frequent or constant Supervision/Assistance  Functional Status Assessment  Patient has had a recent decline in their functional status and demonstrates the ability to make significant improvements in function in a reasonable and predictable amount of time.  Equipment Recommendations  BSC/3in1    Recommendations for Other Services       Precautions / Restrictions Precautions Precautions: Fall Precaution Comments: cortrak Restrictions Weight Bearing Restrictions: No      Mobility Bed Mobility Overal bed mobility: Needs Assistance Bed Mobility: Supine to Sit     Supine to sit: HOB elevated;Min  assist     General bed mobility comments: OOB in recliner upon entry    Transfers Overall transfer level: Needs assistance   Transfers: Sit to/from Stand;Bed to chair/wheelchair/BSC Sit to Stand: Min guard;Min assist     Step pivot transfers: Min assist     General transfer comment: initial stand with min assist with pt with impulsive movement and grabbing at therapist for support then impulsive sit to EOB. 2nd stand with min assist and bil UE support on therapist to step and pivot to chair. 2 additional stands from chair with minguard with mod cues for hand placement and safety and again impulsive sit at chair      Balance Overall balance assessment: Needs assistance   Sitting balance-Leahy Scale: Good Sitting balance - Comments: able to engage in LB ADLs with supervision   Standing balance support: Single extremity supported;During functional activity Standing balance-Leahy Scale: Poor Standing balance comment: min assist in standing                           ADL either performed or assessed with clinical judgement   ADL Overall ADL's : Needs assistance/impaired     Grooming: Set up;Sitting           Upper Body Dressing : Minimal assistance;Sitting   Lower Body Dressing: Minimal assistance;Sit to/from stand   Toilet Transfer: Minimal assistance Statistician Details (indicate cue type and reason): hand held support sit to stand           General ADL Comments: pt limited by cognition, weakness, activity tolerance     Vision   Additional Comments: appears WFL, continue assessment  Perception     Praxis      Pertinent Vitals/Pain Pain Assessment: Faces Pain Score: 3  Faces Pain Scale: Hurts little more Pain Location: coretrack Pain Descriptors / Indicators: Discomfort Pain Intervention(s): Repositioned;Limited activity within patient's tolerance     Hand Dominance Left   Extremity/Trunk Assessment Upper Extremity  Assessment Upper Extremity Assessment: Generalized weakness   Lower Extremity Assessment Lower Extremity Assessment: Defer to PT evaluation   Cervical / Trunk Assessment Cervical / Trunk Assessment: Normal   Communication Communication Communication: No difficulties   Cognition Arousal/Alertness: Awake/alert Behavior During Therapy: Flat affect Overall Cognitive Status: Impaired/Different from baseline Area of Impairment: Attention;Memory;Following commands;Safety/judgement;Awareness;Problem solving                   Current Attention Level: Sustained Memory: Decreased short-term memory Following Commands: Follows one step commands inconsistently Safety/Judgement: Decreased awareness of safety;Decreased awareness of deficits Awareness: Intellectual Problem Solving: Slow processing;Requires verbal cues General Comments: pt oriented, decreased memory and time of day awareness with short blessed test (but scored 5/28 questionable impairment); slow processing and requires cueing to attend to tasks.     General Comments  VSS on RA    Exercises     Shoulder Instructions      Home Living Family/patient expects to be discharged to:: Private residence Living Arrangements: Spouse/significant other Available Help at Discharge: Friend(s);Available PRN/intermittently Type of Home: Apartment Home Access: Stairs to enter Entrance Stairs-Number of Steps: 2 flights Entrance Stairs-Rails: Right;Left Home Layout: One level     Bathroom Shower/Tub: Chief Strategy Officer: Standard     Home Equipment: Agricultural consultant (2 wheels)          Prior Functioning/Environment Prior Level of Function : Independent/Modified Independent             Mobility Comments: pt reports independence, shopping and able to climb stairs ADLs Comments: reports on medical leave but typically independent (reports has been too weak to shower for the last 2 weeks), doesn't drive,  manages meds; boyfriend picks up meds and assists with IADLs        OT Problem List: Decreased strength;Decreased activity tolerance;Impaired balance (sitting and/or standing);Decreased cognition;Decreased safety awareness;Decreased knowledge of use of DME or AE;Decreased knowledge of precautions      OT Treatment/Interventions: Therapeutic exercise;Self-care/ADL training;DME and/or AE instruction;Therapeutic activities;Patient/family education;Balance training    OT Goals(Current goals can be found in the care plan section) Acute Rehab OT Goals Patient Stated Goal: to get home OT Goal Formulation: With patient Time For Goal Achievement: 09/24/21 Potential to Achieve Goals: Fair  OT Frequency: Min 2X/week   Barriers to D/C:            Co-evaluation              AM-PAC OT "6 Clicks" Daily Activity     Outcome Measure Help from another person eating meals?: None (liquids only) Help from another person taking care of personal grooming?: A Little Help from another person toileting, which includes using toliet, bedpan, or urinal?: A Lot Help from another person bathing (including washing, rinsing, drying)?: A Lot Help from another person to put on and taking off regular upper body clothing?: A Little Help from another person to put on and taking off regular lower body clothing?: A Lot 6 Click Score: 16   End of Session Nurse Communication: Mobility status  Activity Tolerance: Patient tolerated treatment well Patient left: with call bell/phone within reach;in chair;with chair alarm set;with nursing/sitter in room  OT Visit Diagnosis: Other abnormalities of gait and mobility (R26.89);Muscle weakness (generalized) (M62.81);Other symptoms and signs involving cognitive function                Time: 2449-7530 OT Time Calculation (min): 23 min Charges:  OT General Charges $OT Visit: 1 Visit OT Evaluation $OT Eval Moderate Complexity: 1 Mod OT Treatments $Self Care/Home  Management : 8-22 mins  Barry Brunner, OT Acute Rehabilitation Services Pager [phone] Office [phone]   Chancy Milroy 09/10/2021, 12:59 PM

## 2021-09-10 NOTE — Plan of Care (Signed)

## 2021-09-10 NOTE — TOC Initial Note (Signed)
Transition of Care [facility]) - Initial/Assessment Note    Patient Details  Name: Annette [name] MRN: [id number] Date of Birth: [birth year]  [facility]) CM/SW Contact:    Milinda Antis, LCSWA Phone Number: 09/10/2021, 6:12 PM  Clinical Narrative:                 CSW met with patient and patient's mother, Alroy Bailiff, at bedside.  The patient reported that she was comfortable with her mother being present for assessment.  The patient reports that she currently lives alone in an apartment.  The patient reports having numerous natural supports including the mother, father, and grandmother.  The patient's mother reports that she would like for the patient to come to [place] to live with her as she is a CNA and the patient's grandmother is an Therapist, sports.  The patient is considering this.    CSW discussed PT/OT recommendations with the patient.  The patient reports that she is open to receiving home health services and that she does not currently have any DME at the home.  The patient is currently uninsured, but reports that financial counseling came to speak with her today and will be submitting a Medicaid application on her behalf.      TOC will continue to follow.  Lind Covert, MSW, LCSWA          Patient Goals and CMS Choice        Expected Discharge Plan and Services                                                Prior Living Arrangements/Services                       Activities of Daily Living      Permission Sought/Granted                  Emotional Assessment              Admission diagnosis:  DKA (diabetic ketoacidosis) ([place]) [E11.10] Transaminitis [R74.01] AKI (acute kidney injury) ([place]) [N17.9] Diabetic ketoacidosis without coma associated with type 2 diabetes mellitus ([place]) [E11.10] Patient Active Problem List   Diagnosis Date Noted   Protein-calorie malnutrition, severe 09/10/2021   DKA (diabetic ketoacidosis) ([place])  09/06/2021   AKI (acute kidney injury) ([place])    Pulmonary embolism ([place]) 07/29/2021   Deep vein thrombosis (DVT) of right lower extremity ([facility])    Acetaminophen overdose, accidental or unintentional, initial encounter 03/11/2021   Type 2 diabetes mellitus ([place]) 03/11/2021   Pain, dental 03/11/2021   Acute deep vein thrombosis (DVT) of proximal vein of right lower extremity ([place])    COVID-19    Pulmonary embolus ([place]) 09/16/2019   PCP:  Gifford Shave, MD Pharmacy:   [place], [place] - [address] [place] [address] Phone: [phone] Fax: [place] [address] [address] Phone: [phone] Fax: [phone]     Social Determinants of Health (SDOH) Interventions    Readmission Risk Interventions No flowsheet data found.

## 2021-09-10 NOTE — Progress Notes (Signed)
ANTICOAGULATION CONSULT NOTE - Follow Up Consult  Pharmacy Consult for heparin Indication: pulmonary embolus  Labs: Recent Labs    09/07/21 1754 09/08/21 0445 09/08/21 1553 09/08/21 1554 09/09/21 0634 09/09/21 0642 09/09/21 1454 09/09/21 1806 09/10/21 0336  HGB  --    < >  --   --   --  10.7* 11.1*  --  10.6*  HCT  --    < >  --   --   --  31.9* 32.4*  --  31.7*  PLT  --    < >  --   --   --  PLATELET CLUMPS NOTED ON SMEAR, UNABLE TO ESTIMATE 102*  --  96*  APTT 37*  --   --   --   --   --   --   --   --   HEPARINUNFRC  --    < >  --   --   --  0.21*  --  0.19* 0.19*  CREATININE  --    < >  --  6.69* 6.86*  --   --   --  6.43*  CKTOTAL  --   --  65  --   --   --   --   --   --    < > = values in this interval not displayed.    Assessment: 30yo female subtherapeutic on heparin with no change in level despite rate increase; no infusion issues or signs of bleeding per RN.  Goal of Therapy:  Heparin level 0.3-0.7 units/ml   Plan:  Will increase heparin infusion by 3 units/kg/hr to 2000 units/hr and check level in 8 hours.    Vernard Gambles, PharmD, BCPS  09/10/2021,5:04 AM

## 2021-09-10 NOTE — Progress Notes (Signed)
Brief Nutrition Note  Discussed pt with RN and during ICU rounds. Post-pyloric Cortrak tube placed today with tube tip in proximal jejunum. Pt still with N/V and unable to tolerate much PO. Pt remains on clear liquid diet with IV reglan ordered.  RN has initiated tube feeding as ordered yesterday: - Start Osmolite 1.5 @ 20 ml/hr and advance by 10 ml q 8 hours to goal rate of 55 ml/hr (1320 ml/day) - ProSource TF 45 ml BID   Tube feeding regimen at goal rate will provide 2060 kcal, 105 grams of protein, and 1006 ml of H2O.  Nephrology has ordered free water flushes of 200 ml q 4 hours. This will provide a total of 2206 ml free water daily.  Will also continue with Boost Breeze order so that pt can take POs as able.  Monitor magnesium, potassium, and phosphorus BID for at least 3 days, MD to replete as needed, as pt is at risk for refeeding syndrome given severe acute malnutrition. Labs have been ordered.  RD will continue to follow during admission.   Mertie Clause, MS, RD, LDN Inpatient Clinical Dietitian Please see AMiON for contact information.

## 2021-09-11 ENCOUNTER — Encounter (HOSPITAL_COMMUNITY): Payer: Self-pay | Admitting: Pulmonary Disease

## 2021-09-11 DIAGNOSIS — E870 Hyperosmolality and hypernatremia: Secondary | ICD-10-CM | POA: Diagnosis not present

## 2021-09-11 DIAGNOSIS — R112 Nausea with vomiting, unspecified: Secondary | ICD-10-CM

## 2021-09-11 DIAGNOSIS — E1142 Type 2 diabetes mellitus with diabetic polyneuropathy: Secondary | ICD-10-CM

## 2021-09-11 DIAGNOSIS — F321 Major depressive disorder, single episode, moderate: Secondary | ICD-10-CM

## 2021-09-11 DIAGNOSIS — R45851 Suicidal ideations: Secondary | ICD-10-CM

## 2021-09-11 DIAGNOSIS — F329 Major depressive disorder, single episode, unspecified: Secondary | ICD-10-CM

## 2021-09-11 HISTORY — DX: Type 2 diabetes mellitus with diabetic polyneuropathy: E11.42

## 2021-09-11 LAB — CBC
HCT: 33 % — ABNORMAL LOW (ref 36.0–46.0)
Hemoglobin: 10.9 g/dL — ABNORMAL LOW (ref 12.0–15.0)
MCH: 27.5 pg (ref 26.0–34.0)
MCHC: 33 g/dL (ref 30.0–36.0)
MCV: 83.1 fL (ref 80.0–100.0)
Platelets: 142 10*3/uL — ABNORMAL LOW (ref 150–400)
RBC: 3.97 MIL/uL (ref 3.87–5.11)
RDW: 14.7 % (ref 11.5–15.5)
WBC: 8.4 10*3/uL (ref 4.0–10.5)
nRBC: 0 % (ref 0.0–0.2)

## 2021-09-11 LAB — RENAL FUNCTION PANEL
Albumin: 2.4 g/dL — ABNORMAL LOW (ref 3.5–5.0)
Anion gap: 12 (ref 5–15)
BUN: 74 mg/dL — ABNORMAL HIGH (ref 6–20)
CO2: 20 mmol/L — ABNORMAL LOW (ref 22–32)
Calcium: 7.4 mg/dL — ABNORMAL LOW (ref 8.9–10.3)
Chloride: 122 mmol/L — ABNORMAL HIGH (ref 98–111)
Creatinine, Ser: 5 mg/dL — ABNORMAL HIGH (ref 0.44–1.00)
GFR, Estimated: 11 mL/min — ABNORMAL LOW (ref >60)
Glucose, Bld: 198 mg/dL — ABNORMAL HIGH (ref 70–99)
Phosphorus: 2.1 mg/dL — ABNORMAL LOW (ref 2.5–4.6)
Potassium: 3.6 mmol/L (ref 3.5–5.1)
Sodium: 154 mmol/L — ABNORMAL HIGH (ref 135–145)

## 2021-09-11 LAB — GLUCOSE, CAPILLARY
Glucose-Capillary: 156 mg/dL — ABNORMAL HIGH (ref 70–99)
Glucose-Capillary: 162 mg/dL — ABNORMAL HIGH (ref 70–99)
Glucose-Capillary: 176 mg/dL — ABNORMAL HIGH (ref 70–99)
Glucose-Capillary: 131 mg/dL — ABNORMAL HIGH (ref 70–99)
Glucose-Capillary: 210 mg/dL — ABNORMAL HIGH (ref 70–99)

## 2021-09-11 LAB — HEPARIN LEVEL (UNFRACTIONATED)
Heparin Unfractionated: <0.1 IU/mL — ABNORMAL LOW (ref 0.30–0.70)
Heparin Unfractionated: 0.37 IU/mL (ref 0.30–0.70)

## 2021-09-11 LAB — PHOSPHORUS
Phosphorus: 2 mg/dL — ABNORMAL LOW (ref 2.5–4.6)
Phosphorus: 3 mg/dL (ref 2.5–4.6)

## 2021-09-11 LAB — OSMOLALITY, URINE: Osmolality, Ur: 245 mOsm/kg — ABNORMAL LOW (ref 300–900)

## 2021-09-11 LAB — MAGNESIUM
Magnesium: 2.3 mg/dL (ref 1.7–2.4)
Magnesium: 2.3 mg/dL (ref 1.7–2.4)

## 2021-09-11 MED ORDER — FREE WATER
400.0000 mL | Freq: Four times a day (QID) | Status: DC
  Administered 2021-09-11 (×2): 400 mL via ORAL

## 2021-09-11 MED ORDER — POTASSIUM PHOSPHATES 15 MMOLE/5ML IV SOLN
20.0000 mmol | Freq: Once | INTRAVENOUS | Status: AC
  Administered 2021-09-11: 20 mmol via INTRAVENOUS
  Filled 2021-09-11: qty 6.67

## 2021-09-11 MED ORDER — METOCLOPRAMIDE HCL 5 MG/ML IJ SOLN
10.0000 mg | Freq: Three times a day (TID) | INTRAMUSCULAR | Status: DC
Start: 2021-09-11 — End: 2021-09-13
  Administered 2021-09-11 – 2021-09-13 (×5): 10 mg via INTRAVENOUS
  Filled 2021-09-11 (×5): qty 2

## 2021-09-11 MED ORDER — METOCLOPRAMIDE HCL 5 MG/ML IJ SOLN
5.0000 mg | Freq: Once | INTRAMUSCULAR | Status: AC
Start: 2021-09-11 — End: 2021-09-11
  Administered 2021-09-11: 5 mg via INTRAVENOUS
  Filled 2021-09-11: qty 2

## 2021-09-11 NOTE — Progress Notes (Signed)
[facility] NEPHROLOGY PROGRESS NOTE  Assessment/ Plan:  Annette [name] is a 30 y.o. yo female  with uncontrolled type II diabetes mellitus, previous DVT/PE (provoked and unprovoked) admitted 12/5 to ICU for acute metabolic encephalopathy in setting of diabetic ketoacidosis and acute renal failure, now on the floor with improving renal function.  #Non-oliguric acute renal failure, most likely hemodynamically mediated with severe dehydration, osmotic diuresis in DKA, and sepsis #Metabolic acidosis - Patient had significant urine output yesterday, 1.9 cc/kg/hr - UPCR elevated, although not in nephrotic range - Rheumatoid factor elevated, otherwise complements, ANCA, anti-dsDNA, ANA negative/normal - No labs for patient yet, but expect renal function to continue to improve - Will determine need for IV sodium bicarbonate after labs return - Will wait for labs to determine IVF  #Hypovolemic hypernatremia - No updated labs today, pending - Uosm 245, UNa 66. Can consider water deprivation test for possible acquired nephrogenic diabetes insipidus - sodium level normal upon admission  #Nausea, vomiting - Better controlled this morning - Likely chronic, would benefit from gastric emptying study  #Normocytic anemia #Thrombocytopenia - No signs/symptoms of bleeding - Will obtain repeat CBC this AM  #Uncontrolled type II diabetes mellitus #Diabetic ketoacidosis - Sugars elevated overnight, appropriate this AM - Management per primary  #E. coli bacteremia - On Ancef, management per primary   #History of DVT/PE - Possible underlying autoimmune disease vs severe proteinuria 2/2 uncontrolled type II diabetes leading to hypercoagulable state - Management per primary   Subjective:    This morning [name] is doing well, has no complaints. She is inquiring about discharge. Otherwise denies chest pain, dyspnea, abdominal pain, dysgeusia. She does report one episode of vomiting  earlier this morning.   Objective Vital signs in last 24 hours: Vitals:   09/10/21 2325 09/11/21 0000 09/11/21 0330 09/11/21 0350  BP: (!) 140/91 139/90 (!) 149/94   Pulse: (!) 117 (!) 116 (!) 103   Resp: 19 14 18    Temp: 98.2 F (36.8 C)  98.6 F (37 C)   TempSrc: Oral  Oral   SpO2: 98% 97% 97%   Weight:    96.2 kg  Height:       Weight change:   Intake/Output Summary (Last 24 hours) at 09/11/2021 0742 Last data filed at 09/11/2021 14/07/2021 Gross per 24 hour  Intake 4227.61 ml  Output 5435 ml  Net -1207.39 ml    Labs: Basic Metabolic Panel: Recent Labs  Lab 09/08/21 1554 09/09/21 0634 09/10/21 0336 09/10/21 1824  NA 153* 153* 152*  --   K 4.4 4.5 3.7  --   CL 121* 124* 123*  --   CO2 17* 16* 16*  --   GLUCOSE 175* 150* 93  --   BUN 69* 81* 82*  --   CREATININE 6.69* 6.86* 6.43*  --   CALCIUM 8.6* 8.1* 7.1*  --   PHOS  --  <1.0* 2.2*  2.2* 3.4   Liver Function Tests: Recent Labs  Lab 09/06/21 1210 09/08/21 0445 09/09/21 0634 09/10/21 0336  AST 64* 23  --   --   ALT 31 28  --   --   ALKPHOS 185* 113  --   --   BILITOT 3.5* 0.4  --   --   PROT 9.2* 5.9*  --   --   ALBUMIN 2.9* 1.6* <1.5* <1.5*   No results for input(s): LIPASE, AMYLASE in the last 168 hours. No results for input(s): AMMONIA in the last 168 hours. CBC:  Recent Labs  Lab 09/06/21 1210 09/06/21 2131 09/07/21 0400 09/08/21 0445 09/09/21 0642 09/09/21 1454 09/10/21 0336  WBC 21.2*  --  13.4* 11.5* 8.4 8.8 8.8  NEUTROABS 18.9*  --   --   --   --   --   --   HGB 15.3*   < > 12.1 11.3* 10.7* 11.1* 10.6*  HCT 51.7*   < > 37.2 32.7* 31.9* 32.4* 31.7*  MCV 97.0  --  86.1 83.2 84.4 83.9 84.3  PLT 226  --  158 PLATELET CLUMPS NOTED ON SMEAR, UNABLE TO ESTIMATE PLATELET CLUMPS NOTED ON SMEAR, UNABLE TO ESTIMATE 102* 96*   < > = values in this interval not displayed.   Cardiac Enzymes: Recent Labs  Lab 09/08/21 1553  CKTOTAL 65   CBG: Recent Labs  Lab 09/10/21 1149 09/10/21 1607  09/10/21 2014 09/10/21 2323 09/11/21 0358  GLUCAP 159* 244* 298* 247* 156*    Iron Studies: No results for input(s): IRON, TIBC, TRANSFERRIN, FERRITIN in the last 72 hours. Studies/Results: DG Abd Portable 1V  Result Date: 09/10/2021 CLINICAL DATA:  Feeding tube placement EXAM: PORTABLE ABDOMEN - 1 VIEW COMPARISON:  None. FINDINGS: Weighted tip feeding tube has been advanced into the proximal jejunum beyond the ligament of Treitz. Visualized bowel gas pattern is normal. Regional bones unremarkable. Visualized lung bases clear. IMPRESSION: Feeding tube advancement to proximal jejunum. Electronically Signed   By: Lucrezia Europe M.D.   On: 09/10/2021 10:01    Medications: Infusions:   ceFAZolin (ANCEF) IV Stopped (09/10/21 2047)   famotidine (PEPCID) IV Stopped (09/10/21 1453)   feeding supplement (OSMOLITE 1.5 CAL) 40 mL/hr at 09/10/21 2342   heparin 2,000 Units/hr (09/11/21 0400)    sodium bicarbonate (isotonic) infusion in sterile water 75 mL/hr at 09/11/21 0400    Scheduled Medications:  Chlorhexidine Gluconate Cloth  6 each Topical Daily   feeding supplement  1 Container Oral TID BM   feeding supplement (PROSource TF)  45 mL Per Tube BID   free water  200 mL Per Tube Q4H   insulin aspart  0-20 Units Subcutaneous Q4H   insulin detemir  15 Units Subcutaneous BID   mouth rinse  15 mL Mouth Rinse BID   metoCLOPramide (REGLAN) injection  5 mg Intravenous Q12H   sertraline  50 mg Per Tube Daily   sodium chloride flush  10-40 mL Intracatheter Q12H    have reviewed scheduled and prn medications.  Physical Exam: General: Resting comfortably, no acute distress Heart: Regular rate, rhythm. Normal S1, S2. No murmurs. Lungs: Normal respiratory effort. Clear to auscultation bilaterally. Abdomen: Soft, non-tender, non-distended Extremities: No edema Neuro: Awake, alert, conversing appropriately. No focal deficits.  Sanjuan Dame, MD Internal Medicine Resident PGY-2 Pager:  [phone] 09/11/2021,7:42 AM  LOS: 5 days

## 2021-09-11 NOTE — Progress Notes (Signed)
FPTS Brief Progress Note  S:Patient awake in bed when I came into the room, just finished getting bathed. She endorsed feeling well. Denied any questions or concerns.    O: BP 136/76 (BP Location: Right Arm)   Pulse 99   Temp 98.7 F (37.1 C) (Axillary)   Resp 18   Ht 5\' 6"  (1.676 m)   Wt 96.2 kg   SpO2 98%   BMI 34.23 kg/m   General: alert, awake in bed, NAD CV: RRR  Resp: normal WOB on RA   A/P:  E Coli bacteremia - Continue Ancef   Uncontrolled DM2 Pm CBG 210 - continue Levemir 15mg  BID - sSSI - CBG QID  - Orders reviewed. Labs for AM ordered, which was adjusted as needed.    , DO 09/11/2021, 10:06 PM PGY-2, [facility] Night Resident  Please page [phone] with questions.

## 2021-09-11 NOTE — Progress Notes (Signed)
ANTICOAGULATION CONSULT NOTE  Pharmacy Consult:  Heparin Indication: pulmonary embolus and DVT  No Known Allergies  Patient Measurements: Height: 5\' 6"  (167.6 cm) Weight: 96.2 kg (212 lb 1.3 oz) IBW/kg (Calculated) : 59.3 Heparin Dosing Weight:  78 kg  Vital Signs: Temp: 98.5 F (36.9 C) (12/10 0814) Temp Source: Oral (12/10 0814) BP: 149/80 (12/10 0814) Pulse Rate: 77 (12/10 0814)  Labs: Recent Labs    09/08/21 1553 09/08/21 1554 09/09/21 0634 09/09/21 0642 09/09/21 1454 09/09/21 1806 09/10/21 0336 09/10/21 1344 09/11/21 0951  HGB  --   --   --    < > 11.1*  --  10.6*  --  10.9*  HCT  --   --   --    < > 32.4*  --  31.7*  --  33.0*  PLT  --   --   --    < > 102*  --  96*  --  142*  HEPARINUNFRC  --   --   --    < >  --    < > 0.19* 0.39 <0.10*  CREATININE  --    < > 6.86*  --   --   --  6.43*  --  5.00*  CKTOTAL 65  --   --   --   --   --   --   --   --    < > = values in this interval not displayed.     Estimated Creatinine Clearance: 19.6 mL/min (A) (by C-G formula based on SCr of 5 mg/dL (H)).   Assessment: 14 YOF presented on 12/5 with AMS, DKA and bacteremia.  Patient has a history of PE in October 2022 and DVT.  She was seen in the ED on 12/4 for hemoptysis/hematemesis and left AMA.  No hemoptysis observed thus far this admission and CBC is stable.  Pharmacy consulted for IV heparin dosing.    Heparin level subtherapeutic at <0.1. Per RN 14/4, she saw patient this AM with IVs unhooked and running all over the floor. She reports she is unsure how long it was like this, but it was fixed around 0800 this AM. Since patient was only getting heparin for ~2 hours when level was drawn, it is likely inaccurate. Plan to repeat level this afternoon.   Goal of Therapy:  Heparin level 0.3-0.7 units/ml Monitor platelets by anticoagulation protocol: Yes   Plan:  Continue heparin gtt at 2000 units/hr Heparin level ~6 hours from IV being fixed Daily heparin level  and CBC (monitor plts closely) Monitor closely for s/sx of bleeding  Thank you for including pharmacy in the care of this patient.  Victorino Dike, PharmD PGY1 Acute Care Pharmacy Resident  Phone: [phone] 09/11/2021  11:11 AM  Please check [email or web address] for unit-specific pharmacy phone numbers.

## 2021-09-11 NOTE — Progress Notes (Signed)
Suicide sitter reports to RN that patient stated that she wanted to kill her ex-boyfriend and his new girlfriend.  MD made aware.  Will continue to monitor.

## 2021-09-11 NOTE — Progress Notes (Signed)
ANTICOAGULATION CONSULT NOTE  Pharmacy Consult:  Heparin Indication: pulmonary embolus and DVT  No Known Allergies  Patient Measurements: Height: 5\' 6"  (167.6 cm) Weight: 96.2 kg (212 lb 1.3 oz) IBW/kg (Calculated) : 59.3 Heparin Dosing Weight:  78 kg  Vital Signs: Temp: 97.7 F (36.5 C) (12/10 1540) Temp Source: Oral (12/10 1540) BP: 144/88 (12/10 1540) Pulse Rate: 82 (12/10 1540)  Labs: Recent Labs    09/09/21 0634 09/09/21 0642 09/09/21 1454 09/09/21 1806 09/10/21 0336 09/10/21 1344 09/11/21 0951 09/11/21 1638  HGB  --    < > 11.1*  --  10.6*  --  10.9*  --   HCT  --    < > 32.4*  --  31.7*  --  33.0*  --   PLT  --    < > 102*  --  96*  --  142*  --   HEPARINUNFRC  --    < >  --    < > 0.19* 0.39 <0.10* 0.37  CREATININE 6.86*  --   --   --  6.43*  --  5.00*  --    < > = values in this interval not displayed.     Estimated Creatinine Clearance: 19.6 mL/min (A) (by C-G formula based on SCr of 5 mg/dL (H)).   Assessment: 65 YOF presented on 12/5 with AMS, DKA and bacteremia.  Patient has a history of PE in October 2022 and DVT.  She was seen in the ED on 12/4 for hemoptysis/hematemesis and left AMA.  No hemoptysis observed thus far this admission and CBC is stable.  Pharmacy consulted for IV heparin dosing.    Heparin level subtherapeutic at <0.1. Per RN 14/4, she saw patient this AM with IVs unhooked and running all over the floor. She reports she is unsure how long it was like this, but it was fixed around 0800 this AM.   Heparin level tonight came back at 0.37, on 2000 units/hr. No s/sx of bleeding or infusion issues per nursing.   Goal of Therapy:  Heparin level 0.3-0.7 units/ml Monitor platelets by anticoagulation protocol: Yes   Plan:  Continue heparin gtt at 2000 units/hr Daily heparin level and CBC (monitor plts closely) Monitor closely for s/sx of bleeding  Thank you for including pharmacy in the care of this patient.  Victorino Dike, PharmD,  BCCCP Clinical Pharmacist  Phone: [phone] 09/11/2021 5:11 PM  Please check AMION for all [facility] Pharmacy phone numbers After 10:00 PM, call Main Pharmacy [phone]

## 2021-09-11 NOTE — Progress Notes (Signed)
Pt refusing lab draw at this time.  Pt agrees to allow a lab draw at 5pm.  Will continue to monitor.

## 2021-09-11 NOTE — Progress Notes (Signed)
Family Medicine Teaching Service Daily Progress Note Intern Pager: [phone]  Patient name: Annette [name] Medical record number: [id number] Date of birth: 09-Feb-1991 Age: 30 y.o. Gender: female  Primary Care Provider: Derrel Nip, MD Consultants: Critical care Code Status: Full code  Pt Overview and Major Events to Date:  12/5-admitted to the ICU in DKA with AKI and UTI, blood cultures growing E. coli, acute renal failure 12/6-titrated off Endo tool but was still encephalopathic, started on Rocephin 12/10-transferred out of the ICU to [facility]  Assessment and Plan:  Annette [name] is a 30 year old female with PMH of T2DM, history of DVT, recent submassive PE (October 2020), anxiety and depression who presented to the emergency department with altered mental status in DKA.  She was also found to have a UTI and be bacteremic with blood cultures growing E. coli.  Uncontrolled T2DM  DKA Most recent hemoglobin A1c 13.3.  At home she is on Lantus 30 units daily as well as metformin 500 mg daily.  DKA has resolved and gap is closed.  Currently on Levemir 15 units twice daily.  CBGs over the last 24 hours ranging from 156-298 - Continue 15 mg Levemir twice daily - Clear liquid diet - CBGs 4 times daily -Sensitive sliding scale insulin - Patient will need up ophthalmologic evaluation outpatient   Nausea Patient with chronic severe nausea.  Attempted clears on 12/9 without success so core track was placed postpyloric on 12/9 for nutrition and meds.  Called to patient's room regarding irate patient.  She reports she is going to pull the core track out if we do not remove it.  Core track removed. - Clear liquid diet - Strict I's and O's - Continue antiemetics, increased Reglan to 10 mg 3 times daily -Patient may need core track replaced if amenable pending toleration of orals - Consider gastric emptying study once clinically improved  AKI Likely due to severe dehydration.  Patient currently  diuresing with 4 L of urine output yesterday and an additional 1500 mL today.  Patient refused lab work this morning but has agreed to get it at this time.  BMP pending. - Nephrology consulted, appreciate their assistance in this patient's care - Continue to monitor strict I's and O's - Daily BMPs  Anxiety/depression/SI Patient with history of depression.  Reports that she is going through a break-up and is having difficulty coping.  Zoloft started on 12/8.  This morning she told nursing staff that she was going to kill her self. - Suicide precautions in place - One-to-one sitter - Psychiatry consulted - Continue Zoloft  History of DVT/PE Patient with lower extremity DVT and PE in October 2022.  Discharged on Xarelto.  Unclear if she was taking this.  No current signs of symptoms of DVT or PE. - Continue heparin - Continue to monitor respiratory status-Daily CBCs and monitor for blood loss - Plan to transition to DOAC after renal improvement  FEN/GI: Liquid diet PPx: Heparin Dispo:Home pending clinical improvement . Barriers include continued.   Subjective:  On arrival to the room the patient is laying upside down in bed with her head at the foot of the bed and the feet at the top.  The nurse reports that she told the nurse tech that she was going to kill her self.  She had also unhooked the core track as well as her IVs.  She told me that she was going to pull the core track out if we did not remove it.  Discussed  with her that we could get it removed but we need to make sure that she could tolerate liquids and she reported that she was going to remove it herself.  We did pull the core track so that the patient did not injure herself.  She reports that she has a lot going on in her life and she just broke up with her boyfriend which is why she wanted to hurt herself.  Does not feel like she is as nauseous as she was yesterday.  Would like water and Coke.  Objective: Temp:  [97.7 F (36.5  C)-98.6 F (37 C)] 98.5 F (36.9 C) (12/10 0814) Pulse Rate:  [77-117] 77 (12/10 0814) Resp:  [14-21] 18 (12/10 0814) BP: (127-149)/(80-105) 149/80 (12/10 0814) SpO2:  [97 %-99 %] 98 % (12/10 0814) Weight:  [96.2 kg-96.6 kg] 96.2 kg (12/10 0350) Physical Exam: General: Alert, anxious Cardiovascular: Regular rate and rhythm, no murmurs appreciated Respiratory: Normal work of breathing, lungs are clear to auscultation bilaterally Abdomen: Soft, positive bowel sounds Extremities: No edema appreciated Psych: Anxious, admits to SI, later reported that she admitted to HI as well.  Laboratory: Recent Labs  Lab 09/09/21 1454 09/10/21 0336 09/11/21 0951  WBC 8.8 8.8 8.4  HGB 11.1* 10.6* 10.9*  HCT 32.4* 31.7* 33.0*  PLT 102* 96* 142*   Recent Labs  Lab 09/06/21 1210 09/06/21 1656 09/08/21 0445 09/08/21 1554 09/09/21 0634 09/10/21 0336 09/11/21 0951  NA 134*   < > 151*   < > 153* 152* 154*  K 5.9*   < > 4.6   < > 4.5 3.7 3.6  CL 100   < > 121*   < > 124* 123* 122*  CO2 7*   < > 18*   < > 16* 16* 20*  BUN 38*   < > 61*   < > 81* 82* 74*  CREATININE 3.54*   < > 6.22*   < > 6.86* 6.43* 5.00*  CALCIUM 9.9   < > 8.5*   < > 8.1* 7.1* 7.4*  PROT 9.2*  --  5.9*  --   --   --   --   BILITOT 3.5*  --  0.4  --   --   --   --   ALKPHOS 185*  --  113  --   --   --   --   ALT 31  --  28  --   --   --   --   AST 64*  --  23  --   --   --   --   GLUCOSE 1,168*   < > 310*   < > 150* 93 198*   < > = values in this interval not displayed.    DG Abd Portable 1V  Result Date: 09/10/2021 CLINICAL DATA:  Feeding tube placement EXAM: PORTABLE ABDOMEN - 1 VIEW COMPARISON:  None. FINDINGS: Weighted tip feeding tube has been advanced into the proximal jejunum beyond the ligament of Treitz. Visualized bowel gas pattern is normal. Regional bones unremarkable. Visualized lung bases clear. IMPRESSION: Feeding tube advancement to proximal jejunum. Electronically Signed   By: Corlis Leak M.D.   On:  09/10/2021 10:01     Derrel Nip, MD 09/11/2021, 1:26 PM PGY-3, [facility] Family [facility] FPTS Intern pager: [phone], text pages welcome

## 2021-09-11 NOTE — Consult Note (Addendum)
[facility] Face-to-Face Psychiatry Consult   Reason for Consult:  suicide risk evaluation - pt made suicidal statement Referring Physician:   Randall Hiss, MD  Patient Identification: Annette [name] MRN:  [id number] Principal Diagnosis: DKA (diabetic ketoacidosis) ([place]) Diagnosis:  Principal Problem:   DKA (diabetic ketoacidosis) ([place]) Active Problems:   Type 2 diabetes mellitus with insulin therapy ([place])   Recurrent pulmonary embolism (HCC)   AKI (acute kidney injury) ([place])   Protein-calorie malnutrition, severe   Diabetic polyneuropathy associated with type 2 diabetes mellitus ([place])   Suicide ideation   Intractable vomiting with nausea   Hypernatremia   MDD (major depressive disorder)   Total Time spent with patient: 30 minutes  Subjective:   Annette [name] is a 30 y.o. female patient with uncontrolled T2DM, previous DVT, and recent submassive PE (Oct 2022) who presented to hospital with altered mental status and tachypnea. Pt is admitted for DKA, uncontrolled T2DM, E. Coli bacteremia secondary to UTI, Sepsis, Acute metabolic encephalopathy likely 2/2 to DKA and sepsis, Anion gap metabolic acidosis 2/2 DKA- resolved, Lactic acidosis, AKI, oliguric, Hypernatremia, Hyperchloremic metabolic acidosis, Hx of PE/DVT, and Hemoptysis, resolved.  Psychiatry team was consulted for suicide risk evaluation as pt made suicidal statements. Per Nurse and sitter, pt also made homicidal statements toward ex-boyfriend and his new girl friend.    On initial evaluation today, the patient denies having made any suicidal statements or statements that would lead to others perceiving that she wanted to be dead, cause harm to herself, or end her own life.  The patient does admit to saying that she wanted to kill her exBoyfriend and his new girlfriend.  She reports she said this out of frustration and denies actually wanting to cause these 2 people any direct or indirect harm.  The patient does report anhedonia for  the last few months.  She states "I have no emotion.  I have lost my personality".  She reports that sleep, before coming to the hospital was poor, taking up to 3 to 4 hours to initiate sleep.  She reports that when she falls asleep, she is able to maintain sleep without difficulty.  She reports taking Benadryl at home, to help with initiating sleep.  She reports that while in the hospital, she feels tired and is able to fall asleep and to stay asleep without difficulty.  She reports that prior to hospitalization her appetite had been poor.  Her concentration has been poor.  She reports that her anxiety level is elevated and this is chronic.  She rates her level of anxiety at 8 out of 10, with 10 being the highest level, averaged over the last 1 week.  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  She reports anxiety caused her to feel on edge, have muscle tension, impaired concentration, and impaired sleep.  Denies having panic attacks.  Denies having history of trauma.  She reports "Kristine Royal forever" last night, and is unsure if this was a hallucination or if it was someone else's TV.  She otherwise denies experiencing auditory or visual hallucinations in the past.  Denies other paranoia or psychotic symptoms.  Denies having symptoms meeting criteria for hypomania or mania at this time or in the past.  Per nurse: Patient stated something to the effect that today is the day that I am going to die. There is concern for cuts to wrists, but these are tatoos.   Procedure: Patient had not made any suicidal statements  during her shift, but did say that she wanted to kill her ex-boyfriend and his girlfriend.  Per mother and father: Discussed case with mother and father, who reports the patient had said something to the effect of "I am so sick of this and I am ready for this to be over".  They report that when she had said that statement, they did not believe she was intending those  words as a suicidal statement and they report that they are not concerned that the patient is suicidal.  They report that the patient is exhausted from being ill and said the aforementioned statement in a time of overwhelmed and exasperation.  Past Psychiatric History: Patient denies having previous psychiatric diagnosis.  Denies having been evaluation did by a psychiatrist in the past.  Denies having taken any psychiatric medication in the past.  Denies previous psychiatric hospitalization.  Denies history of suicide attempt.    Past Medical History:  See below.  Denies history of seizures.  Reports history of surgery on right ankle. Past Medical History:  Diagnosis Date   Acute deep vein thrombosis (DVT) of proximal vein of right lower extremity (HCC)    Ankle fracture    COVID-19    Deep vein thrombosis (DVT) of right lower extremity (HCC)    Diabetes mellitus without complication (HCC)    Diabetic polyneuropathy associated with type 2 diabetes mellitus (HCC) 09/11/2021   Pulmonary embolus (HCC) 09/16/2019   History reviewed. No pertinent surgical history.  Family History:  Family History  Problem Relation Age of Onset   Diabetes Mother    Stroke Maternal Grandmother    Diabetes Other    Family Psychiatric  History: Denies family psychiatric history and denies family history of suicide attempt.  Social History: Was working at [employer] as a Production designer, theatre/television/film, up to 2 months ago when she stopped.  She is single.  No children.  Recent break-up with boyfriend. Social History   Substance and Sexual Activity  Alcohol Use Not Currently     Social History   Substance and Sexual Activity  Drug Use Yes   Types: Marijuana    Social History   Socioeconomic History   Marital status: Single    Spouse name: Not on file   Number of children: Not on file   Years of education: Not on file   Highest education level: Not on file  Occupational History   Not on file  Tobacco Use   Smoking status:  Never   Smokeless tobacco: Never  Substance and Sexual Activity   Alcohol use: Not Currently   Drug use: Yes    Types: Marijuana   Sexual activity: Not on file  Other Topics Concern   Not on file  Social History Narrative   Not on file   Social Determinants of Health   Financial Resource Strain: Not on file  Food Insecurity: Not on file  Transportation Needs: Not on file  Physical Activity: Not on file  Stress: Not on file  Social Connections: Not on file   Additional Social History:    Allergies:  No Known Allergies  Labs:  Results for orders placed or performed during the hospital encounter of 09/06/21 (from the past 48 hour(s))  Glucose, capillary     Status: Abnormal   Collection Time: 09/09/21 11:02 AM  Result Value Ref Range   Glucose-Capillary 118 (H) 70 - 99 mg/dL    Comment: Glucose reference range applies only to samples taken after fasting for at least  8 hours.  CBC     Status: Abnormal   Collection Time: 09/09/21  2:54 PM  Result Value Ref Range   WBC 8.8 4.0 - 10.5 K/uL   RBC 3.86 (L) 3.87 - 5.11 MIL/uL   Hemoglobin 11.1 (L) 12.0 - 15.0 g/dL   HCT 32.4 (L) 36.0 - 46.0 %   MCV 83.9 80.0 - 100.0 fL   MCH 28.8 26.0 - 34.0 pg   MCHC 34.3 30.0 - 36.0 g/dL   RDW 14.5 11.5 - 15.5 %   Platelets 102 (L) 150 - 400 K/uL    Comment: Immature Platelet Fraction may be clinically indicated, consider ordering this additional test [id number] REPEATED TO VERIFY PLATELET COUNT CONFIRMED BY SMEAR PLATELET ESTIMATE DONE AND RELEASED PER BARRIE EWARDS, LAB DIRECTOR 09/09/21 1559 P. NGUYEN    nRBC 0.0 0.0 - 0.2 %    Comment: Performed at [facility], [place] [address] [place], [place] [address]  ANA w/Reflex if Positive     Status: None   Collection Time: 09/09/21  2:54 PM  Result Value Ref Range   Anti Nuclear Antibody (ANA) Negative Negative    Comment: (NOTE) Performed At: [facility] [place], [place] [id number] Rush Farmer MD  [id number]   Glucose, capillary     Status: Abnormal   Collection Time: 09/09/21  3:19 PM  Result Value Ref Range   Glucose-Capillary 226 (H) 70 - 99 mg/dL    Comment: Glucose reference range applies only to samples taken after fasting for at least 8 hours.  Heparin level (unfractionated)     Status: Abnormal   Collection Time: 09/09/21  6:06 PM  Result Value Ref Range   Heparin Unfractionated 0.19 (L) 0.30 - 0.70 IU/mL    Comment: (NOTE) The clinical reportable range upper limit is being lowered to >1.10 to align with the FDA approved guidance for the current laboratory assay.  If heparin results are below expected values, and patient dosage has  been confirmed, suggest follow up testing of antithrombin III levels. Performed at [facility], [place] [address], [place], [place] [address]   Anti-DNA antibody, double-stranded     Status: None   Collection Time: 09/09/21  6:06 PM  Result Value Ref Range   ds DNA Ab 1 0 - 9 IU/mL    Comment: (NOTE)                                   Negative      <5                                   Equivocal  5 - 9                                   Positive      >9 Performed At: [facility] [place], [place] [id number] Rush Farmer MD [id number]   ANCA Profile     Status: None   Collection Time: 09/09/21  6:06 PM  Result Value Ref Range   Anti-MPO Antibodies <0.2 0.0 - 0.9 units   Anti-PR3 Antibodies <0.2 0.0 - 0.9 units   C-ANCA <1:20 Neg:<1:20 titer   P-ANCA <1:20 Neg:<1:20 titer    Comment: (NOTE) The presence  of positive fluorescence exhibiting P-ANCA or C-ANCA patterns alone is not specific for the diagnosis of Wegener's Granulomatosis (WG) or microscopic polyangiitis. Decisions about treatment should not be based solely on ANCA IFA results.  The International ANCA Group Consensus recommends follow up testing of positive sera with both PR-3 and MPO-ANCA enzyme immunoassays. As many as 5% serum samples  are positive only by EIA. Ref. AM J Clin Pathol 1999;111:507-513.    Atypical P-ANCA titer <1:20 Neg:<1:20 titer    Comment: (NOTE) The atypical pANCA pattern has been observed in a significant percentage of patients with ulcerative colitis, primary sclerosing cholangitis and autoimmune hepatitis. Performed At: [facility] [place], [place] [id number] Rush Farmer MD [id number]   C3 complement     Status: None   Collection Time: 09/09/21  6:06 PM  Result Value Ref Range   C3 Complement 121 82 - 167 mg/dL    Comment: (NOTE) Performed At: [facility] [place], [place] [id number] Rush Farmer MD [id number]   C4 complement     Status: None   Collection Time: 09/09/21  6:06 PM  Result Value Ref Range   Complement C4, Body Fluid 30 12 - 38 mg/dL    Comment: (NOTE) Performed At: [facility] [place], [place] [id number] Rush Farmer MD [id number]   Rheumatoid factor     Status: Abnormal   Collection Time: 09/09/21  6:06 PM  Result Value Ref Range   Rhuematoid fact SerPl-aCnc 15.4 (H) <14.0 IU/mL    Comment: (NOTE) Performed At: [facility] [place], [place] [id number] Rush Farmer MD [id number]   Glucose, capillary     Status: Abnormal   Collection Time: 09/09/21  7:46 PM  Result Value Ref Range   Glucose-Capillary 162 (H) 70 - 99 mg/dL    Comment: Glucose reference range applies only to samples taken after fasting for at least 8 hours.  Glucose, capillary     Status: Abnormal   Collection Time: 09/09/21 11:12 PM  Result Value Ref Range   Glucose-Capillary 102 (H) 70 - 99 mg/dL    Comment: Glucose reference range applies only to samples taken after fasting for at least 8 hours.  CBC     Status: Abnormal   Collection Time: 09/10/21  3:36 AM  Result Value Ref Range   WBC 8.8 4.0 - 10.5 K/uL   RBC 3.76 (L) 3.87 - 5.11 MIL/uL   Hemoglobin 10.6 (L) 12.0 - 15.0 g/dL    HCT 31.7 (L) 36.0 - 46.0 %   MCV 84.3 80.0 - 100.0 fL   MCH 28.2 26.0 - 34.0 pg   MCHC 33.4 30.0 - 36.0 g/dL   RDW 14.7 11.5 - 15.5 %   Platelets 96 (L) 150 - 400 K/uL    Comment: Immature Platelet Fraction may be clinically indicated, consider ordering this additional test [id number] CONSISTENT WITH PREVIOUS RESULT REPEATED TO VERIFY    nRBC 0.0 0.0 - 0.2 %    Comment: Performed at [facility], [place] [address] [place], [place] [address]  Renal function panel     Status: Abnormal   Collection Time: 09/10/21  3:36 AM  Result Value Ref Range   Sodium 152 (H) 135 - 145 mmol/L   Potassium 3.7 3.5 - 5.1 mmol/L    Comment: DELTA CHECK NOTED   Chloride 123 (H) 98 - 111 mmol/L   CO2 16 (L) 22 - 32 mmol/L   Glucose, Bld 93 70 -  99 mg/dL    Comment: Glucose reference range applies only to samples taken after fasting for at least 8 hours.   BUN 82 (H) 6 - 20 mg/dL   Creatinine, Ser 6.43 (H) 0.44 - 1.00 mg/dL   Calcium 7.1 (L) 8.9 - 10.3 mg/dL   Phosphorus 2.2 (L) 2.5 - 4.6 mg/dL   Albumin <1.5 (L) 3.5 - 5.0 g/dL   GFR, Estimated 8 (L) >60 mL/min    Comment: (NOTE) Calculated using the CKD-EPI Creatinine Equation (2021)    Anion gap 13 5 - 15    Comment: Performed at [facility] [address], [place], [place] [address]  Heparin level (unfractionated)     Status: Abnormal   Collection Time: 09/10/21  3:36 AM  Result Value Ref Range   Heparin Unfractionated 0.19 (L) 0.30 - 0.70 IU/mL    Comment: (NOTE) The clinical reportable range upper limit is being lowered to >1.10 to align with the FDA approved guidance for the current laboratory assay.  If heparin results are below expected values, and patient dosage has  been confirmed, suggest follow up testing of antithrombin III levels. Performed at [facility], [place] [address], [place], [place] [address]   Phosphorus     Status: Abnormal   Collection Time: 09/10/21  3:36 AM  Result Value Ref Range   Phosphorus  2.2 (L) 2.5 - 4.6 mg/dL    Comment: Performed at [facility] [address] [place], [place] [address]  Magnesium     Status: None   Collection Time: 09/10/21  3:36 AM  Result Value Ref Range   Magnesium 2.4 1.7 - 2.4 mg/dL    Comment: Performed at [facility] [address] [place], [place] [address]  Glucose, capillary     Status: Abnormal   Collection Time: 09/10/21  4:43 AM  Result Value Ref Range   Glucose-Capillary 101 (H) 70 - 99 mg/dL    Comment: Glucose reference range applies only to samples taken after fasting for at least 8 hours.  Glucose, capillary     Status: Abnormal   Collection Time: 09/10/21  8:26 AM  Result Value Ref Range   Glucose-Capillary 100 (H) 70 - 99 mg/dL    Comment: Glucose reference range applies only to samples taken after fasting for at least 8 hours.  Protein / creatinine ratio, urine     Status: Abnormal   Collection Time: 09/10/21 10:15 AM  Result Value Ref Range   Creatinine, Urine 34.35 mg/dL   Total Protein, Urine 27 mg/dL    Comment: NO NORMAL RANGE ESTABLISHED FOR THIS TEST   Protein Creatinine Ratio 0.79 (H) 0.00 - 0.15 mg/mg[Cre]    Comment: Performed at [facility] [address], [place], [place] [address]  Osmolality, urine     Status: Abnormal   Collection Time: 09/10/21 10:15 AM  Result Value Ref Range   Osmolality, Ur 245 (L) 300 - 900 mOsm/kg    Comment: Performed at [facility], [place] [place], [place] [address]  Na and K (sodium & potassium), rand urine     Status: None   Collection Time: 09/10/21 10:15 AM  Result Value Ref Range   Sodium, Ur 66 mmol/L   Potassium Urine 11 mmol/L    Comment: Performed at [facility], [place] [address], [place], [place] [address]  Glucose, capillary     Status: Abnormal   Collection Time: 09/10/21 11:49 AM  Result Value Ref Range   Glucose-Capillary 159 (H) 70 - 99 mg/dL    Comment: Glucose reference range applies only to samples taken after  fasting for at least 8 hours.  Heparin level (unfractionated)     Status: None   Collection Time: 09/10/21  1:44 PM  Result Value Ref Range   Heparin Unfractionated 0.39 0.30 - 0.70 IU/mL    Comment: (NOTE) The clinical reportable range upper limit is being lowered to >1.10 to align with the FDA approved guidance for the current laboratory assay.  If heparin results are below expected values, and patient dosage has  been confirmed, suggest follow up testing of antithrombin III levels. Performed at [facility], [place] [address] [place], [place] [address]   Glucose, capillary     Status: Abnormal   Collection Time: 09/10/21  4:07 PM  Result Value Ref Range   Glucose-Capillary 244 (H) 70 - 99 mg/dL    Comment: Glucose reference range applies only to samples taken after fasting for at least 8 hours.  Magnesium     Status: None   Collection Time: 09/10/21  6:24 PM  Result Value Ref Range   Magnesium 2.2 1.7 - 2.4 mg/dL    Comment: Performed at [facility], [place] [address], [place], [place] [address]  Phosphorus     Status: None   Collection Time: 09/10/21  6:24 PM  Result Value Ref Range   Phosphorus 3.4 2.5 - 4.6 mg/dL    Comment: Performed at [facility], [place] [address], [place], [place] [address]  Glucose, capillary     Status: Abnormal   Collection Time: 09/10/21  8:14 PM  Result Value Ref Range   Glucose-Capillary 298 (H) 70 - 99 mg/dL    Comment: Glucose reference range applies only to samples taken after fasting for at least 8 hours.  Glucose, capillary     Status: Abnormal   Collection Time: 09/10/21 11:23 PM  Result Value Ref Range   Glucose-Capillary 247 (H) 70 - 99 mg/dL    Comment: Glucose reference range applies only to samples taken after fasting for at least 8 hours.  Glucose, capillary     Status: Abnormal   Collection Time: 09/11/21  3:58 AM  Result Value Ref Range   Glucose-Capillary 156 (H) 70 - 99 mg/dL    Comment: Glucose reference range  applies only to samples taken after fasting for at least 8 hours.  Glucose, capillary     Status: Abnormal   Collection Time: 09/11/21  8:26 AM  Result Value Ref Range   Glucose-Capillary 162 (H) 70 - 99 mg/dL    Comment: Glucose reference range applies only to samples taken after fasting for at least 8 hours.  CBC     Status: Abnormal   Collection Time: 09/11/21  9:51 AM  Result Value Ref Range   WBC 8.4 4.0 - 10.5 K/uL   RBC 3.97 3.87 - 5.11 MIL/uL   Hemoglobin 10.9 (L) 12.0 - 15.0 g/dL   HCT 33.0 (L) 36.0 - 46.0 %   MCV 83.1 80.0 - 100.0 fL   MCH 27.5 26.0 - 34.0 pg   MCHC 33.0 30.0 - 36.0 g/dL   RDW 14.7 11.5 - 15.5 %   Platelets 142 (L) 150 - 400 K/uL   nRBC 0.0 0.0 - 0.2 %    Comment: Performed at [facility], [place] [address] [place], [place] [address]  Renal function panel  Status: Abnormal   Collection Time: 09/11/21  9:51 AM  Result Value Ref Range   Sodium 154 (H) 135 - 145 mmol/L   Potassium 3.6 3.5 - 5.1 mmol/L   Chloride 122 (H) 98 - 111 mmol/L   CO2 20 (L) 22 - 32 mmol/L   Glucose, Bld 198 (H) 70 - 99 mg/dL    Comment: Glucose reference range applies only to samples taken after fasting for at least 8 hours.   BUN 74 (H) 6 - 20 mg/dL   Creatinine, Ser 5.00 (H) 0.44 - 1.00 mg/dL   Calcium 7.4 (L) 8.9 - 10.3 mg/dL   Phosphorus 2.1 (L) 2.5 - 4.6 mg/dL   Albumin 2.4 (L) 3.5 - 5.0 g/dL   GFR, Estimated 11 (L) >60 mL/min    Comment: (NOTE) Calculated using the CKD-EPI Creatinine Equation (2021)    Anion gap 12 5 - 15    Comment: Performed at [facility] [address] [place], [place] [address]  Heparin level (unfractionated)     Status: Abnormal   Collection Time: 09/11/21  9:51 AM  Result Value Ref Range   Heparin Unfractionated <0.10 (L) 0.30 - 0.70 IU/mL    Comment: (NOTE) The clinical reportable range upper limit is being lowered to >1.10 to align with the FDA approved guidance for the current laboratory assay.  If heparin results are  below expected values, and patient dosage has  been confirmed, suggest follow up testing of antithrombin III levels. Performed at [facility], [place] [address] [place], [place] [address]   Magnesium     Status: None   Collection Time: 09/11/21  9:51 AM  Result Value Ref Range   Magnesium 2.3 1.7 - 2.4 mg/dL    Comment: Performed at [facility] [address] [place], [place] [address]  Phosphorus     Status: Abnormal   Collection Time: 09/11/21  9:51 AM  Result Value Ref Range   Phosphorus 2.0 (L) 2.5 - 4.6 mg/dL    Comment: Performed at [facility] [address] [place],  [address]    Current Facility-Administered Medications  Medication Dose Route Frequency Provider Last Rate Last Admin   calcium carbonate (TUMS - dosed in mg elemental calcium) chewable tablet 200 mg of elemental calcium  1 tablet Oral QID PRN Collene Gobble, MD   200 mg of elemental calcium at 09/11/21 0152   ceFAZolin (ANCEF) IVPB 2g/100 mL premix  2 g Intravenous Q12H Collene Gobble, MD 200 mL/hr at 09/11/21 0856 2 g at 09/11/21 0856   Chlorhexidine Gluconate Cloth 2 % PADS 6 each  6 each Topical Daily Collene Gobble, MD   6 each at 09/11/21 0856   dextrose 50 % solution 0-50 mL  0-50 mL Intravenous PRN Collene Gobble, MD       docusate sodium (COLACE) capsule 100 mg  100 mg Oral BID PRN Collene Gobble, MD       famotidine (PEPCID) IVPB 20 mg premix  20 mg Intravenous Q24H Collene Gobble, MD   Stopped at 09/10/21 1453   feeding supplement (BOOST / RESOURCE BREEZE) liquid 1 Container  1 Container Oral TID BM Collene Gobble, MD   1 Container at 09/10/21 1320   heparin ADULT infusion 100 units/mL (25000 units/21mL)  2,000 Units/hr Intravenous Continuous Collene Gobble, MD 20 mL/hr at 09/11/21 0400 2,000 Units/hr at 09/11/21 0400   insulin aspart (novoLOG) injection 0-20 Units  0-20 Units Subcutaneous Q4H Collene Gobble, MD   4 Units at 09/11/21 0908   insulin detemir (LEVEMIR)  injection 15 Units  15 Units Subcutaneous BID Collene Gobble, MD   15 Units at 09/11/21 [id number]   metoCLOPramide (REGLAN) injection 10 mg  10 mg Intravenous Q8H Gifford Shave, MD       ondansetron [facility]) injection 4 mg  4 mg Intravenous Q6H PRN Collene Gobble, MD   4 mg at 09/11/21 0813   polyethylene glycol (MIRALAX / GLYCOLAX) packet 17 g  17 g Oral Daily PRN Collene Gobble, MD       sertraline (ZOLOFT) tablet 50 mg  50 mg Per Tube Daily Collene Gobble, MD   50 mg at 09/10/21 1024   sodium bicarbonate 150 mEq in sterile water 1,150 mL infusion   Intravenous Continuous Collene Gobble, MD 75 mL/hr at 09/11/21 0400 Infusion Verify at 09/11/21 0400   sodium chloride flush (NS) 0.9 % injection 10-40 mL  10-40 mL Intracatheter Q12H Collene Gobble, MD   10 mL at 09/10/21 1030   sodium chloride flush (NS) 0.9 % injection 10-40 mL  10-40 mL Intracatheter PRN Collene Gobble, MD        Musculoskeletal: Strength & Muscle Tone: Laying in bed Gait & Station: Laying in bed Patient leans: Laying in bed            Psychiatric Specialty Exam:  Presentation  General Appearance: Casual; Well Groomed  Eye Contact:Good  Speech:Clear and Coherent; Normal Rate  Speech Volume:Normal  Handedness:No data recorded  Mood and Affect  Mood:Anxious; Depressed; Dysphoric  Affect:Constricted   Thought Process  Thought Processes:Linear  Descriptions of Associations:Intact  Orientation:Full (Time, Place and Person)  Thought Content:Logical  History of Schizophrenia/Schizoaffective disorder:No data recorded Duration of Psychotic Symptoms:No data recorded Hallucinations:Hallucinations: Auditory Description of Auditory Hallucinations: "wakanda forever" occured last night, pt believes could have been the tv but not sure  Ideas of Reference:None  Suicidal Thoughts:Suicidal Thoughts: No  Homicidal Thoughts:Homicidal Thoughts: No   Sensorium  Memory:Immediate Good; Recent Good;  Remote Good  Judgment:Fair  Insight:Shallow   Executive Functions  Concentration:No data recorded Attention Span:Fair  [place] recorded [place] recorded  Psychomotor Activity  Psychomotor Activity:Psychomotor Activity: Normal   Assets  Assets:No data recorded  Sleep  Sleep:Sleep: Fair   Physical Exam: Physical Exam ROS Blood pressure (!) 149/80, pulse 77, temperature 98.5 F (36.9 C), temperature source Oral, resp. rate 18, height 5\' 6"  (1.676 m), weight 96.2 kg, SpO2 98 %. Body mass index is 34.23 kg/m.  Treatment Plan Summary: Assessment: Major depressive disorder, moderate Generalized anxiety disorder Medical diagnoses -see critical care progress note  Plan: -Patient continues to require medical hospitalization at this time.  -Continue one-to-one sitter and suicide precautions.  -The psychiatry service will continue to round on this patient and monitor risk assessment.  If the patient attempts to leave AMA, please contact the psychiatry consult service, as inpatient psychiatric hospitalization might be required (including IVC if necessary) for risk of danger to self and others. Pt could require duty to warn, at discharge, depending on subsequent risk assessments by the psychiatric team.  -Primary team started Zoloft -patient accepted dose on 12-9, but declined dose today.  In my discussion with the patient she declines psychiatric medication to treat major depressive disorder and generalized anxiety disorder.  She reports that she would rather have psychotherapy and supportive therapy.  -Medical management by  primary and other consult teams  Disposition:  see above  Christoper Allegra, MD 09/11/2021 10:56 AM  Total Time Spent in Direct Patient Care:  I personally spent 50 minutes in the hospital in direct patient care. The direct patient care time included face-to-face time with the patient, reviewing the  patient's chart, communicating with other professionals, and coordinating care. Greater than 50% of this time was spent in counseling or coordinating care with the patient regarding goals of hospitalization, psycho-education, and discharge planning needs.   Janine Limbo, MD Psychiatrist

## 2021-09-11 NOTE — Progress Notes (Signed)
NT reported to RN that patient stated to NT that she was planning on killing herself today.  MD made aware.  MD came to bedside.  Suicide precautions and protocols instituted.  Suicide sitter at bedside.

## 2021-09-11 NOTE — Progress Notes (Signed)
Core track removed per MD order without difficulty.  Will continue to monitor.

## 2021-09-12 DIAGNOSIS — R45851 Suicidal ideations: Secondary | ICD-10-CM

## 2021-09-12 DIAGNOSIS — E43 Unspecified severe protein-calorie malnutrition: Secondary | ICD-10-CM

## 2021-09-12 DIAGNOSIS — I2699 Other pulmonary embolism without acute cor pulmonale: Secondary | ICD-10-CM

## 2021-09-12 LAB — HEPARIN LEVEL (UNFRACTIONATED): Heparin Unfractionated: 0.51 IU/mL (ref 0.30–0.70)

## 2021-09-12 LAB — CBC
HCT: 31.9 % — ABNORMAL LOW (ref 36.0–46.0)
Hemoglobin: 10.7 g/dL — ABNORMAL LOW (ref 12.0–15.0)
MCH: 28.3 pg (ref 26.0–34.0)
MCHC: 33.5 g/dL (ref 30.0–36.0)
MCV: 84.4 fL (ref 80.0–100.0)
Platelets: 177 10*3/uL (ref 150–400)
RBC: 3.78 MIL/uL — ABNORMAL LOW (ref 3.87–5.11)
RDW: 15.1 % (ref 11.5–15.5)
WBC: 10.6 10*3/uL — ABNORMAL HIGH (ref 4.0–10.5)
nRBC: 0 % (ref 0.0–0.2)

## 2021-09-12 LAB — GLUCOSE, CAPILLARY
Glucose-Capillary: 132 mg/dL — ABNORMAL HIGH (ref 70–99)
Glucose-Capillary: 152 mg/dL — ABNORMAL HIGH (ref 70–99)
Glucose-Capillary: 154 mg/dL — ABNORMAL HIGH (ref 70–99)
Glucose-Capillary: 179 mg/dL — ABNORMAL HIGH (ref 70–99)
Glucose-Capillary: 195 mg/dL — ABNORMAL HIGH (ref 70–99)
Glucose-Capillary: 97 mg/dL (ref 70–99)

## 2021-09-12 LAB — RENAL FUNCTION PANEL
Albumin: 2.1 g/dL — ABNORMAL LOW (ref 3.5–5.0)
Anion gap: 8 (ref 5–15)
BUN: 59 mg/dL — ABNORMAL HIGH (ref 6–20)
CO2: 22 mmol/L (ref 22–32)
Calcium: 7 mg/dL — ABNORMAL LOW (ref 8.9–10.3)
Chloride: 122 mmol/L — ABNORMAL HIGH (ref 98–111)
Creatinine, Ser: 4.05 mg/dL — ABNORMAL HIGH (ref 0.44–1.00)
GFR, Estimated: 15 mL/min — ABNORMAL LOW (ref >60)
Glucose, Bld: 133 mg/dL — ABNORMAL HIGH (ref 70–99)
Phosphorus: 3.7 mg/dL (ref 2.5–4.6)
Potassium: 3.2 mmol/L — ABNORMAL LOW (ref 3.5–5.1)
Sodium: 152 mmol/L — ABNORMAL HIGH (ref 135–145)

## 2021-09-12 LAB — PHOSPHORUS: Phosphorus: 3.6 mg/dL (ref 2.5–4.6)

## 2021-09-12 LAB — MAGNESIUM: Magnesium: 2.5 mg/dL — ABNORMAL HIGH (ref 1.7–2.4)

## 2021-09-12 LAB — NA AND K (SODIUM & POTASSIUM), RAND UR
Potassium Urine: 8 mmol/L
Sodium, Ur: 51 mmol/L

## 2021-09-12 LAB — OSMOLALITY, URINE: Osmolality, Ur: 221 mOsm/kg — ABNORMAL LOW (ref 300–900)

## 2021-09-12 MED ORDER — POTASSIUM CHLORIDE 20 MEQ PO PACK: 40.0000 meq | PACK | Freq: Once | ORAL | Status: DC

## 2021-09-12 NOTE — Progress Notes (Addendum)
Family Medicine Teaching Service Daily Progress Note Intern Pager: [phone]  Patient name: Annette [name] Medical record number: [id number] Date of birth: Feb 14, 1991 Age: 30 y.o. Gender: female  Primary Care Provider: Derrel Nip, MD Consultants: Psych, nephrology, PCCM, pharmacy Code Status: Full Code   Pt Overview and Major Events to Date:  12/5: Admitted to ICU in DKA with AKI and UTI, blood cultures growing E. coli, acute renal failure 12/6: Off Endo tool but was still encephalopathic, started on Rocephin 12/10: Transferred out of the ICU to FPTS  Assessment and Plan: Annette [name] is a 30 y.o. female presented with Altered Mental Status in DKA and bacteremia 2/2 UTI. PMHx of PMH of T2DM, history of DVT, recent submassive PE (October 2020), MDD/GAD.  Uncontrolled T2DM  DKA, RESOLVED A1c 12.6 (09/07/2021), last CBG 154 (IP CBG goal <180). Received 11 u of short acting and 30 u of basal. Resistant SSI Levemir 15 units BID CGBs  IV bicarb discontinued Advance diet as tolerated, currently clear liquids and trial off of tube feedings  Hypernatremia  Possible DI  Hypermagnesemia Na+ 152, K+ 3.2, Mg2+ 2.5  Encouraging patient to increase p.o. intake. Consider low-dose Remeron for sleep, mood, and appetite stimulation. Follow-up repeat RFP tonight Follow-up Urine osmolality and urine sodium potassium Daily RFP  AKI Cr (!) 4.05 from 5.0. Cr 3.46 on admission, baseline Cr 0.7.  Suspect prerenal. RFP  MDD  HI Patient denied SI/HI/AVH, however does continue to say that she is very displeased with her not quite ex-boyfriend. Patient has been refusing her Zoloft x2 days. Psychiatry consulted, appreciate assistance and recommendations Suicide precautions DC Zoloft One-to-one sitter  Nausea, RESOLVED Suspect secondary to inconsistent Zoloft use.  FEN/GI: Clear liquid, Tube feeding  PPx: SCDs Start: 09/06/21 1303 heparin gtt Dispo:Home with home health PT, pending clinical  improvement . Barriers include medical stability.   Subjective:  Yesha was seen this AM. Stated that say that she feels great and wants to go home.  Objective: Temp:  [97.6 F (36.4 C)-98.7 F (37.1 C)] 97.6 F (36.4 C) (12/11 0715) Pulse Rate:  [82-99] 97 (12/11 0715) Resp:  [16] 16 (12/11 0715) BP: (136-154)/(76-88) 154/84 (12/11 0715) SpO2:  [100 %] 100 % (12/11 0715) Weight:  [93.6 kg] 93.6 kg (12/11 0514) Physical Exam Vitals and nursing note reviewed.  Constitutional:      General: She is awake. She is not in acute distress.    Appearance: She is not ill-appearing, toxic-appearing or diaphoretic.  HENT:     Head: Normocephalic and atraumatic.  Cardiovascular:     Rate and Rhythm: Normal rate and regular rhythm.     Heart sounds: No murmur heard.   No friction rub. No gallop.  Pulmonary:     Effort: Pulmonary effort is normal. No respiratory distress.     Breath sounds: Normal breath sounds. No wheezing or rales.  Abdominal:     Palpations: Abdomen is soft.     Tenderness: There is no abdominal tenderness. There is no guarding or rebound.  Musculoskeletal:     Right lower leg: No edema.     Left lower leg: No edema.  Skin:    General: Skin is warm and dry.  Neurological:     Mental Status: She is alert.  Psychiatric:        Behavior: Behavior is cooperative.     Laboratory: Recent Labs  Lab 09/10/21 0336 09/11/21 0951 09/12/21 0705  WBC 8.8 8.4 10.6*  HGB 10.6* 10.9* 10.7*  HCT 31.7*  33.0* 31.9*  PLT 96* 142* 177   Recent Labs  Lab 09/06/21 1210 09/06/21 1656 09/08/21 0445 09/08/21 1554 09/09/21 0634 09/10/21 0336 09/11/21 0951  NA 134*   < > 151*   < > 153* 152* 154*  K 5.9*   < > 4.6   < > 4.5 3.7 3.6  CL 100   < > 121*   < > 124* 123* 122*  CO2 7*   < > 18*   < > 16* 16* 20*  BUN 38*   < > 61*   < > 81* 82* 74*  CREATININE 3.54*   < > 6.22*   < > 6.86* 6.43* 5.00*  CALCIUM 9.9   < > 8.5*   < > 8.1* 7.1* 7.4*  PROT 9.2*  --  5.9*  --   --    --   --   BILITOT 3.5*  --  0.4  --   --   --   --   ALKPHOS 185*  --  113  --   --   --   --   ALT 31  --  28  --   --   --   --   AST 64*  --  23  --   --   --   --   GLUCOSE 1,168*   < > 310*   < > 150* 93 198*   < > = values in this interval not displayed.   Results for orders placed or performed during the hospital encounter of 09/06/21 (from the past 24 hour(s))  Glucose, capillary     Status: Abnormal   Collection Time: 09/11/21  8:26 AM  Result Value Ref Range   Glucose-Capillary 162 (H) 70 - 99 mg/dL  CBC     Status: Abnormal   Collection Time: 09/11/21  9:51 AM  Result Value Ref Range   WBC 8.4 4.0 - 10.5 K/uL   RBC 3.97 3.87 - 5.11 MIL/uL   Hemoglobin 10.9 (L) 12.0 - 15.0 g/dL   HCT 93.2 (L) 35.5 - 73.2 %   MCV 83.1 80.0 - 100.0 fL   MCH 27.5 26.0 - 34.0 pg   MCHC 33.0 30.0 - 36.0 g/dL   RDW 20.2 54.2 - 70.6 %   Platelets 142 (L) 150 - 400 K/uL   nRBC 0.0 0.0 - 0.2 %  Renal function panel     Status: Abnormal   Collection Time: 09/11/21  9:51 AM  Result Value Ref Range   Sodium 154 (H) 135 - 145 mmol/L   Potassium 3.6 3.5 - 5.1 mmol/L   Chloride 122 (H) 98 - 111 mmol/L   CO2 20 (L) 22 - 32 mmol/L   Glucose, Bld 198 (H) 70 - 99 mg/dL   BUN 74 (H) 6 - 20 mg/dL   Creatinine, Ser 2.37 (H) 0.44 - 1.00 mg/dL   Calcium 7.4 (L) 8.9 - 10.3 mg/dL   Phosphorus 2.1 (L) 2.5 - 4.6 mg/dL   Albumin 2.4 (L) 3.5 - 5.0 g/dL   GFR, Estimated 11 (L) >60 mL/min   Anion gap 12 5 - 15  Heparin level (unfractionated)     Status: Abnormal   Collection Time: 09/11/21  9:51 AM  Result Value Ref Range   Heparin Unfractionated <0.10 (L) 0.30 - 0.70 IU/mL  Magnesium     Status: None   Collection Time: 09/11/21  9:51 AM  Result Value Ref Range   Magnesium 2.3 1.7 - 2.4 mg/dL  Phosphorus     Status: Abnormal   Collection Time: 09/11/21  9:51 AM  Result Value Ref Range   Phosphorus 2.0 (L) 2.5 - 4.6 mg/dL  Glucose, capillary     Status: Abnormal   Collection Time: 09/11/21 11:05 AM   Result Value Ref Range   Glucose-Capillary 176 (H) 70 - 99 mg/dL  Glucose, capillary     Status: Abnormal   Collection Time: 09/11/21  4:05 PM  Result Value Ref Range   Glucose-Capillary 131 (H) 70 - 99 mg/dL  Magnesium     Status: None   Collection Time: 09/11/21  4:38 PM  Result Value Ref Range   Magnesium 2.3 1.7 - 2.4 mg/dL  Phosphorus     Status: None   Collection Time: 09/11/21  4:38 PM  Result Value Ref Range   Phosphorus 3.0 2.5 - 4.6 mg/dL  Heparin level (unfractionated)     Status: None   Collection Time: 09/11/21  4:38 PM  Result Value Ref Range   Heparin Unfractionated 0.37 0.30 - 0.70 IU/mL  Glucose, capillary     Status: Abnormal   Collection Time: 09/11/21  9:38 PM  Result Value Ref Range   Glucose-Capillary 210 (H) 70 - 99 mg/dL  Glucose, capillary     Status: Abnormal   Collection Time: 09/12/21  6:32 AM  Result Value Ref Range   Glucose-Capillary 152 (H) 70 - 99 mg/dL  CBC     Status: Abnormal   Collection Time: 09/12/21  7:05 AM  Result Value Ref Range   WBC 10.6 (H) 4.0 - 10.5 K/uL   RBC 3.78 (L) 3.87 - 5.11 MIL/uL   Hemoglobin 10.7 (L) 12.0 - 15.0 g/dL   HCT 95.2 (L) 84.1 - 32.4 %   MCV 84.4 80.0 - 100.0 fL   MCH 28.3 26.0 - 34.0 pg   MCHC 33.5 30.0 - 36.0 g/dL   RDW 40.1 02.7 - 25.3 %   Platelets 177 150 - 400 K/uL   nRBC 0.0 0.0 - 0.2 %  Heparin level (unfractionated)     Status: None   Collection Time: 09/12/21  7:05 AM  Result Value Ref Range   Heparin Unfractionated 0.51 0.30 - 0.70 IU/mL  Glucose, capillary     Status: Abnormal   Collection Time: 09/12/21  7:11 AM  Result Value Ref Range   Glucose-Capillary 132 (H) 70 - 99 mg/dL    Imaging/Diagnostic Tests: No results found.   Princess Bruins, DO 09/12/2021, 8:15 AM PGY-1, [facility] FPTS Intern pager: [phone], text pages welcome

## 2021-09-12 NOTE — TOC Initial Note (Signed)
Transition of Care [facility]) - Initial/Assessment Note    Patient Details  Name: Annette [name] MRN: [id number] Date of Birth: April 01, 1991  Transition of Care [facility]) CM/SW Contact:    Gala Lewandowsky, RN Phone Number: 09/12/2021, 2:37 PM  Clinical Narrative:  Case Manager spoke with the patient regarding home needs. Patient states she will be returning home to her mothers house in [place] (address below). Patient states she will benefit from [facility] PT services. Case Manager discussed without having insurance that the services will be expensive for each visit. Patient will not be able to utilize charity home health if she discharges outside of [facility]. Mother wants to discuss out of pocket costs for therapy. Case Manager did provide a Medicare.gov list for [facility]. Case Manager was able to reach out to [facility] and the office will not service private pay at this time. Weekday Case Manager will continue to look into other agencies that may be able to accept private pay. Patient may benefit from [facility] with PT until she can get to a point that she can safely transition and maximize mobility.   Expected Discharge Plan: Home w Home Health Services Barriers to Discharge: Continued Medical Work up   Patient Goals and CMS Choice Patient states their goals for this hospitalization and ongoing recovery are:: to return to her mothers address at [address] [place] [address] CMS Medicare.gov Compare Post Acute Care list provided to:: Patient Choice offered to / list presented to : Patient, Adult Children  Expected Discharge Plan and Services Expected Discharge Plan: Home w Home Health Services In-house Referral: Clinical Social Work Discharge Planning Services: CM Consult Post Acute Care Choice: Home Health     Prior Living Arrangements/Services   Lives with:: Self Patient language and need for interpreter reviewed:: Yes         Need for Family Participation in Patient Care: Yes (Comment) Care giver support system in place?: Yes (comment)   Criminal Activity/Legal Involvement Pertinent to Current Situation/Hospitalization: No - Comment as needed   Emotional Assessment Appearance:: Appears stated age Attitude/Demeanor/Rapport: Engaged Affect (typically observed): Appropriate Orientation: : Oriented to  Time, Oriented to Place, Oriented to Self, Oriented to Situation      Admission diagnosis:  DKA (diabetic ketoacidosis) (HCC) [E11.10] Transaminitis [R74.01] AKI (acute kidney injury) (HCC) [N17.9] Diabetic ketoacidosis without coma associated with type 2 diabetes mellitus (HCC) [E11.10] Patient Active Problem List   Diagnosis Date Noted   Diabetic polyneuropathy associated with type 2 diabetes mellitus (HCC) 09/11/2021   Suicide ideation 09/11/2021   Intractable vomiting with nausea 09/11/2021   Hypernatremia 09/11/2021   MDD (major depressive disorder) 09/11/2021   Protein-calorie malnutrition, severe 09/10/2021   DKA (diabetic ketoacidosis) (HCC) 09/06/2021   AKI (acute kidney injury) (HCC)    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 [place] [address] Phone: [phone] Fax: [phone]  [facility] [address] [place] [address] Phone: [phone] Fax: [phone]     Readmission Risk Interventions No flowsheet data found.

## 2021-09-12 NOTE — Progress Notes (Signed)
FPTS Brief Progress Note  S: Went to bedside for evening rounds-- patient was sleeping comfortably, so I did not wake her. Spoke with primary RN, Chester Holstein, who does not have any concerns at this time. Reports patient is doing much better than last night.   O: BP (!) 120/106 (BP Location: Right Arm)   Pulse 99   Temp 97.8 F (36.6 C) (Oral)   Resp 18   Ht 5\' 6"  (1.676 m)   Wt 93.6 kg   SpO2 99%   BMI 33.31 kg/m   Gen: sleeping comfortably, NAD Resp: normal work of breathing, equal chest rise noted  A/P: AKI, Hypernatremia -Encouraging oral hydration -Nephro following, appreciate recommendations -am renal function panel  MDD -Continue to monitor -Psychiatry has signed off, discontinued 1:1 sitter  T2DM CBG range 97-195 today -Continue plan per day team progress note   - Orders reviewed. Labs for AM ordered, which was adjusted as needed.   , MD 09/12/2021, 11:29 PM PGY-2, [facility] Night Resident  Please page [phone] with questions.

## 2021-09-12 NOTE — Progress Notes (Signed)
PSYCHIATRY CONSULT MD Progress Note  09/12/2021 4:00 PM [place]  MRN:  [id number]  Subjective:   Jazaria Jarecki is a 30 y.o. female patient with uncontrolled T2DM, previous DVT, and recent submassive PE (Oct 2022) who presented to hospital with altered mental status and tachypnea. Pt is admitted for DKA, uncontrolled T2DM, E. Coli bacteremia secondary to UTI, Sepsis, Acute metabolic encephalopathy likely 2/2 to DKA and sepsis, Anion gap metabolic acidosis 2/2 DKA- resolved, Lactic acidosis, AKI, oliguric, Hypernatremia, Hyperchloremic metabolic acidosis, Hx of PE/DVT, and Hemoptysis, resolved.  Yesterday the psychiatry team made the following recommendations: -Patient continues to require medical hospitalization at this time.  -Continue one-to-one sitter and suicide precautions.  -The psychiatry service will continue to round on this patient and monitor risk assessment.  -Primary team started Zoloft -patient accepted dose on 12-9, but declined dose today.   -Medical management by primary and other consult teams  On my evaluation today, the pt reports that her mood is "okay" feeling down and sad that comes and goes throughout the day. Denies feeling sad most of the day. Reports less anhedonia and more interest and motivation.  She reports sleeping well overnight. Her concentration is better today. Appetite is improved.  She discusses with this Clinical research associate that she did make states that she wanted to die, out of frustration when she was feeling down about her physical health, 2 days ago. She denies actually wanting to die or wishing to be dead. She denies having thoughts to harm herself then or now. Pt is able to talk about those statements she made clearly today. She also denies having thoughts to harm anyone else, specially she denies having thoughts to harm her ex boyfriend or his new girlf friend, on exam today. She reports those statements to harm those people were out of frustration as well  and has no desire or wish to harm either of those people.  Pt's mother was in the room during psychiatric interview today and during risk assessment.   Per mother: pt said the suicidal and homicidal statements occurred when the pt was feeling frustrated with herself and her poor health. Mother states pt was confused and frustrated about other things that day as well, such as telling her mom she was worried about getting and "STD" due to the catheter and "mom don't move it I don't want you to give me an STD" - however the pt had meant to say UTI.  I discussed the risk evaluation with the mother, and our concern for the pt when she had made those statement. The mother reports she feels that the patient is not in imminent harm to herself or others, and she believes that the pt will be safe without a 1:1 sitter.   Principal Problem: DKA (diabetic ketoacidosis) (HCC) Diagnosis: Principal Problem:   DKA (diabetic ketoacidosis) (HCC) Active Problems:   Type 2 diabetes mellitus with insulin therapy (HCC)   Recurrent pulmonary embolism (HCC)   AKI (acute kidney injury) (HCC)   Protein-calorie malnutrition, severe   Diabetic polyneuropathy associated with type 2 diabetes mellitus (HCC)   Suicide ideation   Intractable vomiting with nausea   Hypernatremia   MDD (major depressive disorder)  Total Time spent with patient: 20 minutes  Past Psychiatric History: see psych initial consult   Past Medical History:  Past Medical History:  Diagnosis Date   Acute deep vein thrombosis (DVT) of proximal vein of right lower extremity (HCC)    Ankle fracture    COVID-19  Deep vein thrombosis (DVT) of right lower extremity (HCC)    Diabetes mellitus without complication (HCC)    Diabetic polyneuropathy associated with type 2 diabetes mellitus (HCC) 09/11/2021   Pulmonary embolus (HCC) 09/16/2019   History reviewed. No pertinent surgical history. Family History:  Family History  Problem Relation Age of  Onset   Diabetes Mother    Stroke Maternal Grandmother    Diabetes Other    Family Psychiatric  History: see psych initial consult   Social History:  Social History   Substance and Sexual Activity  Alcohol Use Not Currently     Social History   Substance and Sexual Activity  Drug Use Yes   Types: Marijuana    Social History   Socioeconomic History   Marital status: Single    Spouse name: Not on file   Number of children: Not on file   Years of education: Not on file   Highest education level: Not on file  Occupational History   Not on file  Tobacco Use   Smoking status: Never   Smokeless tobacco: Never  Substance and Sexual Activity   Alcohol use: Not Currently   Drug use: Yes    Types: Marijuana   Sexual activity: Not on file  Other Topics Concern   Not on file  Social History Narrative   Not on file   Social Determinants of Health   Financial Resource Strain: Not on file  Food Insecurity: Not on file  Transportation Needs: Not on file  Physical Activity: Not on file  Stress: Not on file  Social Connections: Not on file   Additional Social History:                         Sleep: Good  Appetite:  Good  Current Medications: Current Facility-Administered Medications  Medication Dose Route Frequency Provider Last Rate Last Admin   calcium carbonate (TUMS - dosed in mg elemental calcium) chewable tablet 200 mg of elemental calcium  1 tablet Oral QID PRN Leslye Peer, MD   200 mg of elemental calcium at 09/11/21 0152   ceFAZolin (ANCEF) IVPB 2g/100 mL premix  2 g Intravenous Q12H Leslye Peer, MD 200 mL/hr at 09/12/21 0844 2 g at 09/12/21 0844   Chlorhexidine Gluconate Cloth 2 % PADS 6 each  6 each Topical Daily Leslye Peer, MD   6 each at 09/12/21 0844   dextrose 50 % solution 0-50 mL  0-50 mL Intravenous PRN Leslye Peer, MD       docusate sodium (COLACE) capsule 100 mg  100 mg Oral BID PRN Leslye Peer, MD       feeding  supplement (BOOST / RESOURCE BREEZE) liquid 1 Container  1 Container Oral TID BM Leslye Peer, MD   1 Container at 09/11/21 2056   heparin ADULT infusion 100 units/mL (25000 units/292mL)  2,000 Units/hr Intravenous Continuous Leslye Peer, MD 20 mL/hr at 09/12/21 0624 2,000 Units/hr at 09/12/21 0624   insulin aspart (novoLOG) injection 0-20 Units  0-20 Units Subcutaneous Q4H Leslye Peer, MD   4 Units at 09/12/21 1219   insulin detemir (LEVEMIR) injection 15 Units  15 Units Subcutaneous BID Leslye Peer, MD   15 Units at 09/12/21 0838   metoCLOPramide (REGLAN) injection 10 mg  10 mg Intravenous Q8H Derrel Nip, MD   10 mg at 09/12/21 1539   ondansetron (ZOFRAN) injection 4 mg  4 mg Intravenous Q6H  PRN Leslye Peer, MD   4 mg at 09/12/21 1610   polyethylene glycol (MIRALAX / GLYCOLAX) packet 17 g  17 g Oral Daily PRN Leslye Peer, MD       potassium chloride (KLOR-CON) packet 40 mEq  40 mEq Oral Once Princess Bruins, DO       sodium chloride flush (NS) 0.9 % injection 10-40 mL  10-40 mL Intracatheter Q12H Leslye Peer, MD   10 mL at 09/10/21 1030   sodium chloride flush (NS) 0.9 % injection 10-40 mL  10-40 mL Intracatheter PRN Leslye Peer, MD        Lab Results:  Results for orders placed or performed during the hospital encounter of 09/06/21 (from the past 48 hour(s))  Glucose, capillary     Status: Abnormal   Collection Time: 09/10/21  4:07 PM  Result Value Ref Range   Glucose-Capillary 244 (H) 70 - 99 mg/dL    Comment: Glucose reference range applies only to samples taken after fasting for at least 8 hours.  Magnesium     Status: None   Collection Time: 09/10/21  6:24 PM  Result Value Ref Range   Magnesium 2.2 1.7 - 2.4 mg/dL    Comment: Performed at [facility], [address], [place], [place] [address]  Phosphorus     Status: None   Collection Time: 09/10/21  6:24 PM  Result Value Ref Range   Phosphorus 3.4 2.5 - 4.6 mg/dL    Comment: Performed at  [facility], [address], [place], [place] [address]  Glucose, capillary     Status: Abnormal   Collection Time: 09/10/21  8:14 PM  Result Value Ref Range   Glucose-Capillary 298 (H) 70 - 99 mg/dL    Comment: Glucose reference range applies only to samples taken after fasting for at least 8 hours.  Glucose, capillary     Status: Abnormal   Collection Time: 09/10/21 11:23 PM  Result Value Ref Range   Glucose-Capillary 247 (H) 70 - 99 mg/dL    Comment: Glucose reference range applies only to samples taken after fasting for at least 8 hours.  Glucose, capillary     Status: Abnormal   Collection Time: 09/11/21  3:58 AM  Result Value Ref Range   Glucose-Capillary 156 (H) 70 - 99 mg/dL    Comment: Glucose reference range applies only to samples taken after fasting for at least 8 hours.  Glucose, capillary     Status: Abnormal   Collection Time: 09/11/21  8:26 AM  Result Value Ref Range   Glucose-Capillary 162 (H) 70 - 99 mg/dL    Comment: Glucose reference range applies only to samples taken after fasting for at least 8 hours.  CBC     Status: Abnormal   Collection Time: 09/11/21  9:51 AM  Result Value Ref Range   WBC 8.4 4.0 - 10.5 K/uL   RBC 3.97 3.87 - 5.11 MIL/uL   Hemoglobin 10.9 (L) 12.0 - 15.0 g/dL   HCT 19.1 (L) 47.8 - 29.5 %   MCV 83.1 80.0 - 100.0 fL   MCH 27.5 26.0 - 34.0 pg   MCHC 33.0 30.0 - 36.0 g/dL   RDW 62.1 30.8 - 65.7 %   Platelets 142 (L) 150 - 400 K/uL   nRBC 0.0 0.0 - 0.2 %    Comment: Performed at [facility], [address] [place], [place] [address]  Renal function panel  Status: Abnormal   Collection Time: 09/11/21  9:51 AM  Result Value Ref Range   Sodium 154 (H) 135 - 145 mmol/L   Potassium 3.6 3.5 - 5.1 mmol/L   Chloride 122 (H) 98 - 111 mmol/L   CO2 20 (L) 22 - 32 mmol/L   Glucose, Bld 198 (H) 70 - 99 mg/dL    Comment: Glucose reference range applies only to samples taken after fasting for at least 8 hours.   BUN 74 (H) 6 - 20  mg/dL   Creatinine, Ser 1.61 (H) 0.44 - 1.00 mg/dL   Calcium 7.4 (L) 8.9 - 10.3 mg/dL   Phosphorus 2.1 (L) 2.5 - 4.6 mg/dL   Albumin 2.4 (L) 3.5 - 5.0 g/dL   GFR, Estimated 11 (L) >60 mL/min    Comment: (NOTE) Calculated using the CKD-EPI Creatinine Equation (2021)    Anion gap 12 5 - 15    Comment: Performed at [facility], [address] [place], [place] [address]  Heparin level (unfractionated)     Status: Abnormal   Collection Time: 09/11/21  9:51 AM  Result Value Ref Range   Heparin Unfractionated <0.10 (L) 0.30 - 0.70 IU/mL    Comment: (NOTE) The clinical reportable range upper limit is being lowered to >1.10 to align with the FDA approved guidance for the current laboratory assay.  If heparin results are below expected values, and patient dosage has  been confirmed, suggest follow up testing of antithrombin III levels. Performed at [facility], [address], [place], [place] [address]   Magnesium     Status: None   Collection Time: 09/11/21  9:51 AM  Result Value Ref Range   Magnesium 2.3 1.7 - 2.4 mg/dL    Comment: Performed at [facility], [address] [place], [place] [address]  Phosphorus     Status: Abnormal   Collection Time: 09/11/21  9:51 AM  Result Value Ref Range   Phosphorus 2.0 (L) 2.5 - 4.6 mg/dL    Comment: Performed at [facility], [address] [place], [place] [address]  Glucose, capillary     Status: Abnormal   Collection Time: 09/11/21 11:05 AM  Result Value Ref Range   Glucose-Capillary 176 (H) 70 - 99 mg/dL    Comment: Glucose reference range applies only to samples taken after fasting for at least 8 hours.  Glucose, capillary     Status: Abnormal   Collection Time: 09/11/21  4:05 PM  Result Value Ref Range   Glucose-Capillary 131 (H) 70 - 99 mg/dL    Comment: Glucose reference range applies only to samples taken after fasting for at least 8 hours.  Magnesium     Status: None   Collection Time: 09/11/21   4:38 PM  Result Value Ref Range   Magnesium 2.3 1.7 - 2.4 mg/dL    Comment: Performed at [facility], [address], [place], [place] [address]  Phosphorus     Status: None   Collection Time: 09/11/21  4:38 PM  Result Value Ref Range   Phosphorus 3.0 2.5 - 4.6 mg/dL    Comment: Performed at [facility], [address], [place], [place] [address]  Heparin level (unfractionated)     Status: None   Collection Time: 09/11/21  4:38 PM  Result Value Ref Range   Heparin Unfractionated 0.37 0.30 - 0.70 IU/mL    Comment: (NOTE) The clinical reportable range upper limit is being  lowered to >1.10 to align with the FDA approved guidance for the current laboratory assay.  If heparin results are below expected values, and patient dosage has  been confirmed, suggest follow up testing of antithrombin III levels. Performed at [facility], [address], [place], [place] [address]   Glucose, capillary     Status: Abnormal   Collection Time: 09/11/21  9:38 PM  Result Value Ref Range   Glucose-Capillary 210 (H) 70 - 99 mg/dL    Comment: Glucose reference range applies only to samples taken after fasting for at least 8 hours.  Glucose, capillary     Status: Abnormal   Collection Time: 09/12/21  6:32 AM  Result Value Ref Range   Glucose-Capillary 152 (H) 70 - 99 mg/dL    Comment: Glucose reference range applies only to samples taken after fasting for at least 8 hours.  CBC     Status: Abnormal   Collection Time: 09/12/21  7:05 AM  Result Value Ref Range   WBC 10.6 (H) 4.0 - 10.5 K/uL   RBC 3.78 (L) 3.87 - 5.11 MIL/uL   Hemoglobin 10.7 (L) 12.0 - 15.0 g/dL   HCT 19.1 (L) 47.8 - 29.5 %   MCV 84.4 80.0 - 100.0 fL   MCH 28.3 26.0 - 34.0 pg   MCHC 33.5 30.0 - 36.0 g/dL   RDW 62.1 30.8 - 65.7 %   Platelets 177 150 - 400 K/uL   nRBC 0.0 0.0 - 0.2 %    Comment: Performed at [facility], [address], [place], [place] [address]  Heparin level (unfractionated)      Status: None   Collection Time: 09/12/21  7:05 AM  Result Value Ref Range   Heparin Unfractionated 0.51 0.30 - 0.70 IU/mL    Comment: (NOTE) The clinical reportable range upper limit is being lowered to >1.10 to align with the FDA approved guidance for the current laboratory assay.  If heparin results are below expected values, and patient dosage has  been confirmed, suggest follow up testing of antithrombin III levels. Performed at [facility], [address], [place], [place] [address]   Magnesium     Status: Abnormal   Collection Time: 09/12/21  7:05 AM  Result Value Ref Range   Magnesium 2.5 (H) 1.7 - 2.4 mg/dL    Comment: Performed at [facility], [address] [place], [place] [address]  Phosphorus     Status: None   Collection Time: 09/12/21  7:05 AM  Result Value Ref Range   Phosphorus 3.6 2.5 - 4.6 mg/dL    Comment: Performed at [facility], [address], [place], [place] [address]  Renal function panel     Status: Abnormal   Collection Time: 09/12/21  7:05 AM  Result Value Ref Range   Sodium 152 (H) 135 - 145 mmol/L   Potassium 3.2 (L) 3.5 - 5.1 mmol/L   Chloride 122 (H) 98 - 111 mmol/L   CO2 22 22 - 32 mmol/L   Glucose, Bld 133 (H) 70 - 99 mg/dL    Comment: Glucose reference range applies only to samples taken after fasting for at least 8 hours.   BUN 59 (H) 6 - 20 mg/dL   Creatinine, Ser 7.25 (H) 0.44 - 1.00 mg/dL   Calcium 7.0 (L) 8.9 - 10.3 mg/dL   Phosphorus 3.7 2.5 - 4.6 mg/dL   Albumin 2.1 (L) 3.5 - 5.0 g/dL   GFR,  Estimated 15 (L) >60 mL/min    Comment: (NOTE) Calculated using the CKD-EPI Creatinine Equation (2021)    Anion gap 8 5 - 15    Comment: Performed at [facility], [address], [place], [place] [address]  Glucose, capillary     Status: Abnormal   Collection Time: 09/12/21  7:11 AM  Result Value Ref Range   Glucose-Capillary 132 (H) 70 - 99 mg/dL    Comment: Glucose reference range applies only to samples  taken after fasting for at least 8 hours.  Glucose, capillary     Status: Abnormal   Collection Time: 09/12/21 11:20 AM  Result Value Ref Range   Glucose-Capillary 154 (H) 70 - 99 mg/dL    Comment: Glucose reference range applies only to samples taken after fasting for at least 8 hours.    Blood Alcohol level:  Lab Results  Component Value Date   ETH <10 09/06/2021   ETH <10 03/11/2021    Metabolic Disorder Labs: Lab Results  Component Value Date   HGBA1C 12.6 (H) 09/07/2021   MPG 314.92 09/07/2021   MPG 335.01 07/29/2021   No results found for: PROLACTIN No results found for: CHOL, TRIG, HDL, CHOLHDL, VLDL, LDLCALC  Physical Findings: AIMS:  , ,  ,  ,    CIWA:    COWS:     Musculoskeletal: Strength & Muscle Tone: laying in bed Gait & Station: laying in bed Patient leans: laying in bed  Psychiatric Specialty Exam:  Presentation  General Appearance: Appropriate for Environment; Casual; Fairly Groomed  Eye Contact:Good  Speech:Clear and Coherent; Normal Rate  Speech Volume:Normal  Handedness:No data recorded  Mood and Affect  Mood:Dysphoric  Affect:Constricted   Thought Process  Thought Processes:Linear  Descriptions of Associations:Intact  Orientation:Full (Time, Place and Person)  Thought Content:Logical  History of Schizophrenia/Schizoaffective disorder:No data recorded Duration of Psychotic Symptoms:No data recorded Hallucinations:Hallucinations: None Description of Auditory Hallucinations: "wakanda forever" occured last night, pt believes could have been the tv but not sure  Ideas of Reference:None  Suicidal Thoughts:Suicidal Thoughts: No  Homicidal Thoughts:Homicidal Thoughts: No   Sensorium  Memory:Recent Good; Remote Good; Immediate Good  Judgment:Fair  Insight:Fair   Executive Functions  Concentration:Good  Attention Span:Good  Recall:No data recorded Fund of Knowledge:No data recorded Language:No data  recorded  Psychomotor Activity  Psychomotor Activity:Psychomotor Activity: Normal   Assets  Assets:No data recorded  Sleep  Sleep:Sleep: Good    Physical Exam: Physical Exam Vitals reviewed.  Constitutional:      General: She is not in acute distress.    Appearance: She is not toxic-appearing.  Pulmonary:     Effort: Pulmonary effort is normal.  Neurological:     Mental Status: She is alert.   Review of Systems  Constitutional:  Negative for chills and fever.  Cardiovascular:  Negative for chest pain and palpitations.  Psychiatric/Behavioral:  Positive for depression. Negative for hallucinations and suicidal ideas. The patient is not nervous/anxious and does not have insomnia.   All other systems reviewed and are negative.  Blood pressure (!) 154/94, pulse 84, temperature 98.2 F (36.8 C), temperature source Oral, resp. rate 18, height 5\' 6"  (1.676 m), weight 93.6 kg, SpO2 98 %. Body mass index is 33.31 kg/m.   Treatment Plan Summary:  Assessment: Major depressive disorder, moderate Generalized anxiety disorder Medical diagnoses -see critical care progress note   Plan: -Patient continues to require medical hospitalization at this time.  -Discontinue one-to-one sitter and suicide precautions.  -Pt can  decline zoloft. We offered medication and outpatient referral for psychotherapy, and pt declined. Safety planning discussed with pt and mother including calling [phone], [phone], and going to [facility].  -Pt does not require inpatient psychiatric hospitalization after medical discharge  -Psychiatry consult service will sign-off -Medical management by primary and other consult teams  Cristy Hilts, MD 09/12/2021, 4:00 PM  Total Time Spent in Direct Patient Care:  I personally spent 30 minutes on the unit in direct patient care. The direct patient care time included face-to-face time with the patient, reviewing the patient's chart, communicating with other professionals, and  coordinating care. Greater than 50% of this time was spent in counseling or coordinating care with the patient regarding goals of hospitalization, psycho-education, and discharge planning needs.   Phineas Inches, MD Psychiatrist

## 2021-09-12 NOTE — Progress Notes (Signed)
[facility] NEPHROLOGY PROGRESS NOTE  Assessment/ Plan: Pt is a 30 y.o. yo female who with uncontrolled type 2 diabetes, DVT/PE.  To ICU for acute metabolic encephalopathy in the setting of DKA, AKI.  #Acute kidney injury, nonoliguric presumably ischemic ATN due to diabetic ketoacidosis/osmotic diuresis and decreased oral intake.  UA with proteinuria, spot urine PCR 0.79. Kidney ultrasound unremarkable.  She has A1c around 13 therefore she likely has underlying diabetic nephropathy.  ANA, antidsDNA, ANCA, complement unremarkable.  Discontinued sodium bicarbonate.   She has increased urine output and renal recovery.  Pending renal panel from today.  No need for dialysis.  Continue oral hydration.  Monitor labs and a strict ins and out.   #Hypernatremia: The feeding tube is out and able to take orally per patient.  Volume status acceptable today.  She has increased urine output and low urine osmolality concerning for DI.  Follow-up renal panel from today and I will check another urine sodium and osmolality.  I educated her to increase intake of water.   #Metabolic acidosis: CO2 improved.  DC IV bicarbonate.  Subjective: Seen and examined in bedside.  The urine output is recorded 5.4 L in 24 hours.  She has some nausea but reports that she is able to take orally.  Denies chest pain, shortness of breath. Objective Vital signs in last 24 hours: Vitals:   09/11/21 1540 09/11/21 2058 09/12/21 0514 09/12/21 0715  BP: (!) 144/88 136/76  (!) 154/84  Pulse: 82 99  97  Resp:    16  Temp: 97.7 F (36.5 C) 98.7 F (37.1 C)  97.6 F (36.4 C)  TempSrc: Oral Axillary    SpO2:    100%  Weight:   93.6 kg   Height:   5\' 6"  (1.676 m)    Weight change: -3 kg  Intake/Output Summary (Last 24 hours) at 09/12/2021 1015 Last data filed at 09/12/2021 0835 Gross per 24 hour  Intake 292 ml  Output 4090 ml  Net -3798 ml       Labs: Basic Metabolic Panel: Recent Labs  Lab 09/09/21 0634  09/10/21 0336 09/10/21 1824 09/11/21 0951 09/11/21 1638 09/12/21 0705  NA 153* 152*  --  154*  --   --   K 4.5 3.7  --  3.6  --   --   CL 124* 123*  --  122*  --   --   CO2 16* 16*  --  20*  --   --   GLUCOSE 150* 93  --  198*  --   --   BUN 81* 82*  --  74*  --   --   CREATININE 6.86* 6.43*  --  5.00*  --   --   CALCIUM 8.1* 7.1*  --  7.4*  --   --   PHOS <1.0* 2.2*  2.2*   < > 2.0*  2.1* 3.0 3.6   < > = values in this interval not displayed.   Liver Function Tests: Recent Labs  Lab 09/06/21 1210 09/08/21 0445 09/09/21 0634 09/10/21 0336 09/11/21 0951  AST 64* 23  --   --   --   ALT 31 28  --   --   --   ALKPHOS 185* 113  --   --   --   BILITOT 3.5* 0.4  --   --   --   PROT 9.2* 5.9*  --   --   --   ALBUMIN 2.9* 1.6* <1.5* <1.5*  2.4*   No results for input(s): LIPASE, AMYLASE in the last 168 hours. No results for input(s): AMMONIA in the last 168 hours. CBC: Recent Labs  Lab 09/06/21 1210 09/06/21 2131 09/09/21 0642 09/09/21 1454 09/10/21 0336 09/11/21 0951 09/12/21 0705  WBC 21.2*   < > 8.4 8.8 8.8 8.4 10.6*  NEUTROABS 18.9*  --   --   --   --   --   --   HGB 15.3*   < > 10.7* 11.1* 10.6* 10.9* 10.7*  HCT 51.7*   < > 31.9* 32.4* 31.7* 33.0* 31.9*  MCV 97.0   < > 84.4 83.9 84.3 83.1 84.4  PLT 226   < > PLATELET CLUMPS NOTED ON SMEAR, UNABLE TO ESTIMATE 102* 96* 142* 177   < > = values in this interval not displayed.   Cardiac Enzymes: Recent Labs  Lab 09/08/21 1553  CKTOTAL 65   CBG: Recent Labs  Lab 09/11/21 1105 09/11/21 1605 09/11/21 2138 09/12/21 0632 09/12/21 0711  GLUCAP 176* 131* 210* 152* 132*    Iron Studies: No results for input(s): IRON, TIBC, TRANSFERRIN, FERRITIN in the last 72 hours. Studies/Results: No results found.  Medications: Infusions:   ceFAZolin (ANCEF) IV 2 g (09/12/21 0844)   heparin 2,000 Units/hr (09/12/21 0037)    Scheduled Medications:  Chlorhexidine Gluconate Cloth  6 each Topical Daily   feeding  supplement  1 Container Oral TID BM   insulin aspart  0-20 Units Subcutaneous Q4H   insulin detemir  15 Units Subcutaneous BID   metoCLOPramide (REGLAN) injection  10 mg Intravenous Q8H   sertraline  50 mg Per Tube Daily   sodium chloride flush  10-40 mL Intracatheter Q12H    have reviewed scheduled and prn medications.  Physical Exam: General:NAD, comfortable Heart:RRR, s1s2 nl Lungs:clear b/l, no crackle Abdomen:soft, Non-tender, non-distended Extremities:No edema Neurology: Alert, awake, oriented  Annette [name] 09/12/2021,10:15 AM  LOS: 6 days

## 2021-09-12 NOTE — Progress Notes (Signed)
ANTICOAGULATION CONSULT NOTE  Pharmacy Consult:  Heparin Indication: pulmonary embolus and DVT  No Known Allergies  Patient Measurements: Height: 5\' 6"  (167.6 cm) Weight: 93.6 kg (206 lb 5.6 oz) IBW/kg (Calculated) : 59.3 Heparin Dosing Weight:  78 kg  Vital Signs: Temp: 97.6 F (36.4 C) (12/11 0715) Temp Source: Axillary (12/10 2058) BP: 154/84 (12/11 0715) Pulse Rate: 97 (12/11 0715)  Labs: Recent Labs    09/10/21 0336 09/10/21 1344 09/11/21 0951 09/11/21 1638 09/12/21 0705  HGB 10.6*  --  10.9*  --  10.7*  HCT 31.7*  --  33.0*  --  31.9*  PLT 96*  --  142*  --  177  HEPARINUNFRC 0.19*   < > <0.10* 0.37 0.51  CREATININE 6.43*  --  5.00*  --   --    < > = values in this interval not displayed.     Estimated Creatinine Clearance: 19.3 mL/min (A) (by C-G formula based on SCr of 5 mg/dL (H)).   Assessment: 23 YOF presented on 12/5 with AMS, DKA and bacteremia. Patient has a history of PE in October 2022 and DVT. She was seen in the ED on 12/4 for hemoptysis/hematemesis and left AMA. No hemoptysis observed thus far this admission and CBC is stable. Pharmacy consulted for IV heparin dosing.    Most recent heparin level 0.51 on heparin 2000 units/hr. No signs of bleeding or infusion issues noted per chart review. Of note, platelet count has improved and is now 177 today.  Goal of Therapy:  Heparin level 0.3-0.7 units/ml Monitor platelets by anticoagulation protocol: Yes   Plan:  Continue heparin gtt at 2000 units/hr Daily heparin level and CBC (monitor platelets closely) Monitor closely for s/sx of bleeding  Thank you for including pharmacy in the care of this patient.  14/4, PharmD PGY1 Acute Care Pharmacy Resident  Phone: [phone] 09/12/2021  8:45 AM  Please check [email or web address] for unit-specific pharmacy phone numbers.

## 2021-09-12 NOTE — Progress Notes (Signed)
Offer patient to ambulation, he declines

## 2021-09-13 LAB — CBC
HCT: 32 % — ABNORMAL LOW (ref 36.0–46.0)
Hemoglobin: 10.4 g/dL — ABNORMAL LOW (ref 12.0–15.0)
MCH: 27.7 pg (ref 26.0–34.0)
MCHC: 32.5 g/dL (ref 30.0–36.0)
MCV: 85.3 fL (ref 80.0–100.0)
Platelets: 250 10*3/uL (ref 150–400)
RBC: 3.75 MIL/uL — ABNORMAL LOW (ref 3.87–5.11)
RDW: 15.4 % (ref 11.5–15.5)
WBC: 11 10*3/uL — ABNORMAL HIGH (ref 4.0–10.5)
nRBC: 0 % (ref 0.0–0.2)

## 2021-09-13 LAB — RENAL FUNCTION PANEL
Albumin: 2.1 g/dL — ABNORMAL LOW (ref 3.5–5.0)
Anion gap: 8 (ref 5–15)
BUN: 42 mg/dL — ABNORMAL HIGH (ref 6–20)
CO2: 21 mmol/L — ABNORMAL LOW (ref 22–32)
Calcium: 6.9 mg/dL — ABNORMAL LOW (ref 8.9–10.3)
Chloride: 121 mmol/L — ABNORMAL HIGH (ref 98–111)
Creatinine, Ser: 3.46 mg/dL — ABNORMAL HIGH (ref 0.44–1.00)
GFR, Estimated: 18 mL/min — ABNORMAL LOW (ref >60)
Glucose, Bld: 108 mg/dL — ABNORMAL HIGH (ref 70–99)
Phosphorus: 3.3 mg/dL (ref 2.5–4.6)
Potassium: 3.2 mmol/L — ABNORMAL LOW (ref 3.5–5.1)
Sodium: 150 mmol/L — ABNORMAL HIGH (ref 135–145)

## 2021-09-13 LAB — GLUCOSE, CAPILLARY
Glucose-Capillary: 94 mg/dL (ref 70–99)
Glucose-Capillary: 105 mg/dL — ABNORMAL HIGH (ref 70–99)
Glucose-Capillary: 299 mg/dL — ABNORMAL HIGH (ref 70–99)
Glucose-Capillary: 146 mg/dL — ABNORMAL HIGH (ref 70–99)
Glucose-Capillary: 94 mg/dL (ref 70–99)

## 2021-09-13 LAB — HEPARIN LEVEL (UNFRACTIONATED): Heparin Unfractionated: <0.1 IU/mL — ABNORMAL LOW (ref 0.30–0.70)

## 2021-09-13 MED ORDER — HEPARIN BOLUS VIA INFUSION
2000.0000 [IU] | Freq: Once | INTRAVENOUS | Status: AC
  Administered 2021-09-13: 2000 [IU] via INTRAVENOUS
  Filled 2021-09-13: qty 2000

## 2021-09-13 MED ORDER — INSULIN ASPART 100 UNIT/ML IJ SOLN
0.0000 [IU] | Freq: Three times a day (TID) | INTRAMUSCULAR | Status: DC
  Administered 2021-09-13: 11 [IU] via SUBCUTANEOUS
  Administered 2021-09-13 – 2021-09-14 (×2): 3 [IU] via SUBCUTANEOUS

## 2021-09-13 MED ORDER — ENSURE ENLIVE PO LIQD
237.0000 mL | Freq: Two times a day (BID) | ORAL | Status: DC
  Administered 2021-09-13 – 2021-09-14 (×2): 237 mL via ORAL

## 2021-09-13 MED ORDER — POTASSIUM CHLORIDE CRYS ER 20 MEQ PO TBCR
40.0000 meq | EXTENDED_RELEASE_TABLET | Freq: Once | ORAL | Status: AC
Start: 2021-09-13 — End: 2021-09-13
  Administered 2021-09-13: 40 meq via ORAL
  Filled 2021-09-13: qty 2

## 2021-09-13 MED ORDER — RIVAROXABAN 20 MG PO TABS
20.0000 mg | ORAL_TABLET | Freq: Every day | ORAL | Status: DC
  Administered 2021-09-13: 20 mg via ORAL
  Filled 2021-09-13: qty 1

## 2021-09-13 MED ORDER — METOCLOPRAMIDE HCL 5 MG/ML IJ SOLN: 10.0000 mg | Freq: Three times a day (TID) | INTRAMUSCULAR | Status: DC | PRN

## 2021-09-13 NOTE — Progress Notes (Signed)
Pt did have an episode of pulling all IV line while heparin was running and throw them on floor, when asked she said she needed to go to the bathroom. We reminded her that she can use the call bell if she needs help.

## 2021-09-13 NOTE — Progress Notes (Signed)
Occupational Therapy Treatment Patient Details Name: Annette [name] MRN: [id number] DOB: 07-02-1991 Today's Date: 09/13/2021   History of present illness Pt is a 30 y.o. female admitted 09/06/21 with AMS and tachypnea; workup for DKA, E. Coli bacteremia secondary to UTI, sepsis. PMH includes DVT/PE (07/2021), uncontrolled DM, MDD.   OT comments  Pt engaging in mobility with min guard, transfers to commode with min guard and grooming at sink with min guard for safety.  Mild instability in standing and preference to have UE support.  Difficulty noted with dual cognitive tasks and short term memory, requires increased time for all tasks. Pts mother present and plans to provide her with 24/7 support. Will follow acutely.    Recommendations for follow up therapy are one component of a multi-disciplinary discharge planning process, led by the attending physician.  Recommendations may be updated based on patient status, additional functional criteria and insurance authorization.    Follow Up Recommendations  Home health OT    Assistance Recommended at Discharge Frequent or constant Supervision/Assistance  Equipment Recommendations  BSC/3in1    Recommendations for Other Services      Precautions / Restrictions Precautions Precautions: Fall Precaution Comments: cortrak Restrictions Weight Bearing Restrictions: No       Mobility Bed Mobility Overal bed mobility: Modified Independent Bed Mobility: Sit to Supine       Sit to supine: Modified independent (Device/Increase time);HOB elevated   General bed mobility comments: Received sitting EOB; return to supine mod indep    Transfers Overall transfer level: Needs assistance Equipment used: None Transfers: Sit to/from Stand Sit to Stand: Min guard           General transfer comment: Encouraged to attempt without RW; min guard for balance standing from EOB and low toilet height     Balance Overall balance assessment: Needs  assistance   Sitting balance-Leahy Scale: Good Sitting balance - Comments: able to engage in LB ADLs with supervision   Standing balance support: Single extremity supported;During functional activity Standing balance-Leahy Scale: Fair                             ADL either performed or assessed with clinical judgement   ADL Overall ADL's : Needs assistance/impaired     Grooming: Min guard;Standing;Wash/dry Lawyer: Min guard;Ambulation;Regular Toilet;Grab bars           Functional mobility during ADLs: Min guard General ADL Comments: mobility to/from restroom    Extremity/Trunk Assessment              Vision       Perception     Praxis      Cognition Arousal/Alertness: Awake/alert Behavior During Therapy: WFL for tasks assessed/performed;Flat affect Overall Cognitive Status: Impaired/Different from baseline Area of Impairment: Attention;Memory;Following commands;Safety/judgement;Awareness;Problem solving                   Current Attention Level: Selective Memory: Decreased short-term memory Following Commands: Follows one step commands consistently Safety/Judgement: Decreased awareness of safety;Decreased awareness of deficits Awareness: Emergent Problem Solving: Slow processing;Requires verbal cues General Comments: improving cognition; good awareness and problem solving today, difficulty with dual cognitive tasks and decreased STM (recalling 2/3 items)          Exercises     Shoulder Instructions       General Comments mother  is present and very supportive, she will provide 24/7 support at dc and clairied DME needs    Pertinent Vitals/ Pain       Pain Assessment: No/denies pain Pain Intervention(s): Monitored during session  Home Living                                          Prior Functioning/Environment              Frequency  Min 2X/week         Progress Toward Goals  OT Goals(current goals can now be found in the care plan section)  Progress towards OT goals: Progressing toward goals  Acute Rehab OT Goals Patient Stated Goal: home OT Goal Formulation: With patient Time For Goal Achievement: 09/24/21 Potential to Achieve Goals: Fair  Plan Discharge plan remains appropriate;Frequency remains appropriate    Co-evaluation                 AM-PAC OT "6 Clicks" Daily Activity     Outcome Measure   Help from another person eating meals?: None Help from another person taking care of personal grooming?: A Little Help from another person toileting, which includes using toliet, bedpan, or urinal?: A Little Help from another person bathing (including washing, rinsing, drying)?: A Little Help from another person to put on and taking off regular upper body clothing?: A Little Help from another person to put on and taking off regular lower body clothing?: A Little 6 Click Score: 19    End of Session Equipment Utilized During Treatment: Gait belt  OT Visit Diagnosis: Other abnormalities of gait and mobility (R26.89);Muscle weakness (generalized) (M62.81);Other symptoms and signs involving cognitive function   Activity Tolerance Patient tolerated treatment well   Patient Left with call bell/phone within reach;with nursing/sitter in room;in bed;with bed alarm set   Nurse Communication Mobility status        Time: 9390-3009 OT Time Calculation (min): 23 min  Charges: OT General Charges $OT Visit: 1 Visit OT Treatments $Self Care/Home Management : 8-22 mins  Barry Brunner, OT Acute Rehabilitation Services Pager [phone] Office [phone]   Chancy Milroy 09/13/2021, 1:48 PM

## 2021-09-13 NOTE — Progress Notes (Signed)
Nutrition Follow-up  DOCUMENTATION CODES:   Severe malnutrition in context of acute illness/injury  INTERVENTION:   - Ensure Enlive po BID, each supplement provides 350 kcal and 20 grams of protein  - Encourage PO intake  NUTRITION DIAGNOSIS:   Severe Malnutrition related to acute illness (bacteremia, DKA, recent submassive PE) as evidenced by mild muscle depletion, energy intake < or equal to 50% for > or equal to 5 days, percent weight loss (10.8% weight loss in less than 2 months).  Ongoing, being addressed via diet advancement and oral nutrition supplements  GOAL:   Patient will meet greater than or equal to 90% of their needs  Progressing  MONITOR:   PO intake, Supplement acceptance, Diet advancement, Labs, Weight trends, I & O's  REASON FOR ASSESSMENT:   Consult Enteral/tube feeding initiation and management  ASSESSMENT:   30 year old female who presented to the ED on 12/05 with AMS. PMH of DVT, recent admission for submassive PE in October 2022, poorly controlled T2DM. Pt admitted with DKA, AKI, sepsis secondary to bacteremia from UTI.  12/07 - clear liquids 12/09 - post-pyloric Cortrak placed, tube feeds started 12/10 - Cortrak removed per pt request  Spoke with pt at bedside. Noted ~90% completed breakfast clear liquid tray at bedside. Pt reports that she no longer has nausea. Last emesis documented 3 days ago in flowsheets. Pt is ready for diet advancement. Discussed with MD who agreed with full liquid diet. RD to order oral nutrition supplements to aid pt in meeting kcal and protein needs. Pt does not like Boost Breeze so will d/c these and order Ensure Enlive.  Admit weight: 87.3 kg Current weight: 93.8 kg  Weight trending up since admission; suspect this is related to positive fluid balance.  Meal Completion: 09/10/21: 0%, 5% 12/10: 100%, 100% 12/11: 100%  Medications reviewed and include: Boost Breeze TID, SSI, levemir 15 units BID, IV abx  Labs  reviewed: sodium 150, potassium 3.2, BUN 42, creatinine 3.46 CBG's: 94-299  UOP: 4000 ml x 24 hours I/O's: +1.4 L since admit  Diet Order:   Diet Order             Diet full liquid Room service appropriate? Yes; Fluid consistency: Thin  Diet effective now                   EDUCATION NEEDS:   Education needs have been addressed  Skin:  Skin Assessment: Reviewed RN Assessment  Last BM:  09/12/21 large type 6  Height:   Ht Readings from Last 1 Encounters:  09/12/21 5\' 6"  (1.676 m)    Weight:   Wt Readings from Last 1 Encounters:  09/13/21 93.8 kg    BMI:  Body mass index is 33.38 kg/m.  Estimated Nutritional Needs:   Kcal:  2000-2200  Protein:  100-120 grams  Fluid:  >/= 2.0 L    14/12/22, MS, RD, LDN Inpatient Clinical Dietitian Please see AMiON for contact information.

## 2021-09-13 NOTE — Progress Notes (Addendum)
Family Medicine Teaching Service Daily Progress Note Intern Pager: [phone]  Patient name: Annette [name] Medical record number: [id number] Date of birth: March 07, 1991 Age: 30 y.o. Gender: female  Primary Care Provider: Derrel Nip, MD Consultants: Psych (s/o) Code Status: Full Code   Pt Overview and Major Events to Date:  12/5: Admitted to ICU in DKA with AKI and UTI, blood cultures growing E. coli, acute renal failure 12/6: Off Endo tool but was still encephalopathic, started on Rocephin 12/10: Transferred out of the ICU to FPTS  Assessment and Plan: Annette [name] is a 30 y.o. female presented with Altered Mental Status in DKA and bacteremia 2/2 UTI. PMHx of PMH of T2DM, history of DVT, recent submassive PE (October 2020), MDD/GAD.  Uncontrolled T2DM  DKA, RESOLVED  A1c 12.6 (09/07/2021), last CBG 108 (IP CBG goal <180). Received 11u of short acting and 30u of basal.  K+ 3.2, patient refused repletion initially, reordered. Resistant SSI Levemir 15 units BID CGBs  Advance diet as tolerated to  RFP   E.coli bacteriemia  WBC (!) 11.0 < 10.6. Patient was afebrile and VSS overnight. IV Ancef day 7 (end 12/13) CBC  Hx of DVT/PE Transition from heparin to xarelto per pharmacy  Hypernatremia  Possible DI  Na+ 150<-152.  Urine osmolality 221, urine Na+ 51, urine K+ 8 Management per nephrology  AKI Cr (!) 3.46 <- 4.05, baseline Cr 0.7. UOP 4.2 L Management per nephrology   Nausea Change Reglan 10 mg every 8 hours to PRN D/c Zofran 4 mg every 6 hours PRN as it has not worked during hospitalization  MDD  HI Denied SI/HI/AVH. Psychiatry signed off D/c 1:1 sitter  FEN/GI: Full liquid diet, progress as tolerated PPx: SCDs Start: 09/06/21 1303 heparin Dispo:Home pending clinical improvement . Barriers include hypernatremia and polyuria.   Subjective:  Annette [name] was seen this AM. Stated that she feels fine and is ready to go home and wants to go home tomorrow.  She denied  nausea or vomiting, saying that she is hungry and ready to risk her diet.  Otherwise she has no other concerns.  Discussed with patient risk and benefits of leaving AGAINST MEDICAL ADVICE, patient states she understood.   Objective: Temp:  [97.8 F (36.6 C)-98.4 F (36.9 C)] 97.9 F (36.6 C) (12/12 1157) Pulse Rate:  [99-105] 105 (12/12 1157) Resp:  [18-19] 19 (12/12 1157) BP: (120-143)/(75-106) 137/90 (12/12 1157) SpO2:  [96 %-100 %] 96 % (12/12 1157) Weight:  [93.8 kg] 93.8 kg (12/12 0600) Physical Exam Vitals and nursing note reviewed.  Constitutional:      General: She is not in acute distress.    Appearance: She is not ill-appearing, toxic-appearing or diaphoretic.  HENT:     Head: Normocephalic and atraumatic.  Cardiovascular:     Rate and Rhythm: Regular rhythm. Tachycardia present.     Heart sounds: Normal heart sounds. No murmur heard.   No friction rub. No gallop.  Pulmonary:     Effort: Pulmonary effort is normal. No respiratory distress.     Breath sounds: Normal breath sounds. No wheezing or rales.  Skin:    General: Skin is warm and dry.  Neurological:     Mental Status: She is alert and oriented to person, place, and time. Mental status is at baseline.     Laboratory: Recent Labs  Lab 09/11/21 0951 09/12/21 0705 09/13/21 0216  WBC 8.4 10.6* 11.0*  HGB 10.9* 10.7* 10.4*  HCT 33.0* 31.9* 32.0*  PLT 142* 177 250  Recent Labs  Lab 09/08/21 0445 09/08/21 1554 09/11/21 0951 09/12/21 0705 09/13/21 0216  NA 151*   < > 154* 152* 150*  K 4.6   < > 3.6 3.2* 3.2*  CL 121*   < > 122* 122* 121*  CO2 18*   < > 20* 22 21*  BUN 61*   < > 74* 59* 42*  CREATININE 6.22*   < > 5.00* 4.05* 3.46*  CALCIUM 8.5*   < > 7.4* 7.0* 6.9*  PROT 5.9*  --   --   --   --   BILITOT 0.4  --   --   --   --   ALKPHOS 113  --   --   --   --   ALT 28  --   --   --   --   AST 23  --   --   --   --   GLUCOSE 310*   < > 198* 133* 108*   < > = values in this interval not displayed.    Results for orders placed or performed during the hospital encounter of 09/06/21 (from the past 24 hour(s))  Glucose, capillary     Status: None   Collection Time: 09/12/21  5:06 PM  Result Value Ref Range   Glucose-Capillary 97 70 - 99 mg/dL  Glucose, capillary     Status: Abnormal   Collection Time: 09/12/21  8:23 PM  Result Value Ref Range   Glucose-Capillary 195 (H) 70 - 99 mg/dL   Comment 1 Notify RN    Comment 2 Document in Chart   Glucose, capillary     Status: Abnormal   Collection Time: 09/12/21 11:42 PM  Result Value Ref Range   Glucose-Capillary 179 (H) 70 - 99 mg/dL   Comment 1 Notify RN    Comment 2 Document in Chart   CBC     Status: Abnormal   Collection Time: 09/13/21  2:16 AM  Result Value Ref Range   WBC 11.0 (H) 4.0 - 10.5 K/uL   RBC 3.75 (L) 3.87 - 5.11 MIL/uL   Hemoglobin 10.4 (L) 12.0 - 15.0 g/dL   HCT 24.4 (L) 01.0 - 27.2 %   MCV 85.3 80.0 - 100.0 fL   MCH 27.7 26.0 - 34.0 pg   MCHC 32.5 30.0 - 36.0 g/dL   RDW 53.6 64.4 - 03.4 %   Platelets 250 150 - 400 K/uL   nRBC 0.0 0.0 - 0.2 %  Heparin level (unfractionated)     Status: Abnormal   Collection Time: 09/13/21  2:16 AM  Result Value Ref Range   Heparin Unfractionated <0.10 (L) 0.30 - 0.70 IU/mL  Renal function panel     Status: Abnormal   Collection Time: 09/13/21  2:16 AM  Result Value Ref Range   Sodium 150 (H) 135 - 145 mmol/L   Potassium 3.2 (L) 3.5 - 5.1 mmol/L   Chloride 121 (H) 98 - 111 mmol/L   CO2 21 (L) 22 - 32 mmol/L   Glucose, Bld 108 (H) 70 - 99 mg/dL   BUN 42 (H) 6 - 20 mg/dL   Creatinine, Ser 7.42 (H) 0.44 - 1.00 mg/dL   Calcium 6.9 (L) 8.9 - 10.3 mg/dL   Phosphorus 3.3 2.5 - 4.6 mg/dL   Albumin 2.1 (L) 3.5 - 5.0 g/dL   GFR, Estimated 18 (L) >60 mL/min   Anion gap 8 5 - 15  Glucose, capillary     Status: None  Collection Time: 09/13/21  3:53 AM  Result Value Ref Range   Glucose-Capillary 94 70 - 99 mg/dL   Comment 1 Notify RN    Comment 2 Document in Chart   Glucose,  capillary     Status: Abnormal   Collection Time: 09/13/21  8:09 AM  Result Value Ref Range   Glucose-Capillary 105 (H) 70 - 99 mg/dL  Glucose, capillary     Status: Abnormal   Collection Time: 09/13/21 11:55 AM  Result Value Ref Range   Glucose-Capillary 299 (H) 70 - 99 mg/dL    Imaging/Diagnostic Tests: No results found.   Princess Bruins, DO 09/13/2021, 12:23 PM PGY-1,  Family Medicine FPTS Intern pager: [phone], text pages welcome

## 2021-09-13 NOTE — Progress Notes (Signed)
ANTICOAGULATION CONSULT NOTE  Pharmacy Consult for Heparin to Rivaroxaban  Indication: pulmonary embolus and DVT  No Known Allergies  Patient Measurements: Height: 5\' 6"  (167.6 cm) Weight: 93.8 kg (206 lb 12.7 oz) IBW/kg (Calculated) : 59.3  Heparin Dosing Weight: 78 kg  Vital Signs: Temp: 97.9 F (36.6 C) (12/12 1157) Temp Source: Oral (12/12 1157) BP: 137/90 (12/12 1157) Pulse Rate: 105 (12/12 1157)  Labs: Recent Labs    09/11/21 0951 09/11/21 1638 09/12/21 0705 09/13/21 0216  HGB 10.9*  --  10.7* 10.4*  HCT 33.0*  --  31.9* 32.0*  PLT 142*  --  177 250  HEPARINUNFRC <0.10* 0.37 0.51 <0.10*  CREATININE 5.00*  --  4.05* 3.46*    Estimated Creatinine Clearance: 27.9 mL/min (A) (by C-G formula based on SCr of 3.46 mg/dL (H)).   Assessment: 42 YOF presented on 12/05 with AMS, DKA and bacteremia. Patient has a history of PE in October 2022 and DVT. She was seen in the ED on 12/04 for hemoptysis/hematemesis and left AMA. No hemoptysis observed thus far this admission and CBC is stable. Pharmacy consulted for IV heparin dosing.  Per discussion with team and continuing issue of line removal, risk vs benefit discussed in resuming PTA rivaroxaban due to improved renal function and ongoing line issues leading to concern for undertreating recently diagnosed PE. Will continue to monitor renal function, steadily improving. Scr now down from 6.86 to 3.46 and continues to trend down. No CKD at baseline. UOP remains good/stable. CBC stable, no s/sx bleeding per chart review.   Goal of Therapy:  Heparin level 0.3-0.7 units/ml Monitor platelets by anticoagulation protocol: Yes   Plan:  Stop heparin infusion at 14:00 Give rivaroxaban 20mg  at 14:00 Continue to monitor H&H and platelets    Thank you for allowing pharmacy to be a part of this patient's care.  14/04, PharmD Clinical Pharmacist

## 2021-09-13 NOTE — Progress Notes (Signed)
[facility] NEPHROLOGY PROGRESS NOTE  Assessment/ Plan:  Annette [name] is a 30 y.o. y.o. person with uncontrolled type II diabetes, history of DVT/PE admitted 12/5 to ICU for acute metabolic encephalopathy in setting of diabetic ketoacidosis and acute renal failure, now on the floor being managed by FPTS.  #Non-oliguric acute renal failure, likely ATN 2/2 DKA/osmotic diuresis, decreased oral intake, sepsis - Patient has had appropriate urine output, 1.8cc/kg/hr - Renal function continues to significantly improve, sCr 3.46<4.05; bicarb and GFR also improving - A1c found to be significantly elevated to 12.6% and UPCR 0.79, likely has underlying diabetic nephropathy - Autoimmune and vasculitis work-up negative - No acute indication for dialysis today - Would expect renal function to continue to improve, especially with adequate hydration and renal perfusion - Strict I/O - Avoid nephrotoxins  #Hypernatremia - Patient has been able to tolerate PO - Free water deficit 3.3L this morning - Repeat urine labs yesterday revealed UOsm 221, UNa 51. With continued polyuria (4L UOP yesterday) concern for diabetes insipidus as well  #Hypocalcemia - Corrected calcium this morning 8.4, most likely in setting of acute renal failure - Would expect calcium to continue to improve with improvement of renal function - Will hold off calcium supplementation given renal function appears to be acutely improving in setting of ATN - No signs or symptoms of overt hypocalcemia on exam  #E. coli bacteremia - Patient has remained afebrile, on Ancef - Management per FPTS  #Uncontrolled type II diabetes mellitus - Sugars appear to be at goal - Management per FPTS  Subjective:    This morning, patient reports she is doing well. Denies nausea, vomiting, abdominal pain. Discussed improving renal function and sugar control.  Objective Vital signs in last 24 hours: Vitals:   09/12/21 2338 09/13/21  0347 09/13/21 0600 09/13/21 0814  BP: (!) 141/75 139/75  135/87  Pulse: 100 100  100  Resp: 18 18  18   Temp: 98.1 F (36.7 C) 98.4 F (36.9 C)  98.4 F (36.9 C)  TempSrc: Oral Oral  Oral  SpO2: 99% 98%  100%  Weight:   93.8 kg   Height:       Weight change: 0.2 kg  Intake/Output Summary (Last 24 hours) at 09/13/2021 0959 Last data filed at 09/13/2021 0300 Gross per 24 hour  Intake 200 ml  Output 4000 ml  Net -3800 ml    Labs: Basic Metabolic Panel: Recent Labs  Lab 09/11/21 0951 09/11/21 1638 09/12/21 0705 09/13/21 0216  NA 154*  --  152* 150*  K 3.6  --  3.2* 3.2*  CL 122*  --  122* 121*  CO2 20*  --  22 21*  GLUCOSE 198*  --  133* 108*  BUN 74*  --  59* 42*  CREATININE 5.00*  --  4.05* 3.46*  CALCIUM 7.4*  --  7.0* 6.9*  PHOS 2.0*  2.1* 3.0 3.7  3.6 3.3   Liver Function Tests: Recent Labs  Lab 09/06/21 1210 09/08/21 0445 09/09/21 0634 09/11/21 0951 09/12/21 0705 09/13/21 0216  AST 64* 23  --   --   --   --   ALT 31 28  --   --   --   --   ALKPHOS 185* 113  --   --   --   --   BILITOT 3.5* 0.4  --   --   --   --   PROT 9.2* 5.9*  --   --   --   --  ALBUMIN 2.9* 1.6*   < > 2.4* 2.1* 2.1*   < > = values in this interval not displayed.   No results for input(s): LIPASE, AMYLASE in the last 168 hours. No results for input(s): AMMONIA in the last 168 hours. CBC: Recent Labs  Lab 09/06/21 1210 09/06/21 2131 09/09/21 1454 09/10/21 0336 09/11/21 0951 09/12/21 0705 09/13/21 0216  WBC 21.2*   < > 8.8 8.8 8.4 10.6* 11.0*  NEUTROABS 18.9*  --   --   --   --   --   --   HGB 15.3*   < > 11.1* 10.6* 10.9* 10.7* 10.4*  HCT 51.7*   < > 32.4* 31.7* 33.0* 31.9* 32.0*  MCV 97.0   < > 83.9 84.3 83.1 84.4 85.3  PLT 226   < > 102* 96* 142* 177 250   < > = values in this interval not displayed.   Cardiac Enzymes: Recent Labs  Lab 09/08/21 1553  CKTOTAL 65   CBG: Recent Labs  Lab 09/12/21 1706 09/12/21 2023 09/12/21 2342 09/13/21 0353  09/13/21 0809  GLUCAP 97 195* 179* 94 105*    Iron Studies: No results for input(s): IRON, TIBC, TRANSFERRIN, FERRITIN in the last 72 hours. Studies/Results: No results found.  Medications: Infusions:   ceFAZolin (ANCEF) IV 2 g (09/13/21 0917)   heparin 2,000 Units/hr (09/13/21 0920)    Scheduled Medications:  Chlorhexidine Gluconate Cloth  6 each Topical Daily   feeding supplement  1 Container Oral TID BM   insulin aspart  0-20 Units Subcutaneous TID WC   insulin detemir  15 Units Subcutaneous BID   metoCLOPramide (REGLAN) injection  10 mg Intravenous Q8H   potassium chloride  40 mEq Oral Once   sodium chloride flush  10-40 mL Intracatheter Q12H    have reviewed scheduled and prn medications.  Physical Exam: General: Resting comfortably, no acute distress Heart: Regular rate, rhythm. Normal S1, S2. No murmurs. Lungs: Normal respiratory effort. Clear to auscultation bilaterally. Abdomen: Soft, non-tender, non-distended Extremities: No edema Neuro: Awake, alert, conversing appropriately. No focal deficits.  Evlyn Kanner, MD Internal Medicine Resident PGY-2 Pager: [phone] 09/13/2021,9:59 AM  LOS: 7 days

## 2021-09-13 NOTE — Progress Notes (Signed)
Physical Therapy Treatment Patient Details Name: Annette [name] MRN: [id number] DOB: 23-Apr-1991 Today's Date: 09/13/2021   History of Present Illness Pt is a 30 y.o. female admitted 09/06/21 with AMS and tachypnea; workup for DKA, E. Coli bacteremia secondary to UTI, sepsis. PMH includes DVT/PE (07/2021), uncontrolled DM, MDD.   PT Comments    Pt progressing well with mobility and cognition. Performed transfer and gait training without DME, pt requiring intermittent external assist to prevent LOB. Pt's mother present and supportive, reports she will be able to provide necessary assist upon return home. Will continue to follow acutely to address established goals.    Recommendations for follow up therapy are one component of a multi-disciplinary discharge planning process, led by the attending physician.  Recommendations may be updated based on patient status, additional functional criteria and insurance authorization.  Follow Up Recommendations  Home health PT     Assistance Recommended at Discharge Frequent or constant Supervision/Assistance  Equipment Recommendations  BSC/3in1    Recommendations for Other Services       Precautions / Restrictions Precautions Precautions: Fall Restrictions Weight Bearing Restrictions: No     Mobility  Bed Mobility Overal bed mobility: Modified Independent Bed Mobility: Sit to Supine       Sit to supine: Modified independent (Device/Increase time);HOB elevated   General bed mobility comments: Received sitting EOB; return to supine mod indep    Transfers Overall transfer level: Needs assistance Equipment used: None Transfers: Sit to/from Stand Sit to Stand: Min guard           General transfer comment: Encouraged to attempt without RW; min guard for balance standing from EOB and low toilet height    Ambulation/Gait Ambulation/Gait assistance: Min guard;Min assist Gait Distance (Feet): 200 Feet Assistive device: 1 person hand  held assist;None Gait Pattern/deviations: Step-through pattern;Decreased stride length;Leaning posteriorly Gait velocity: Decreased     General Gait Details: Slow, guarded, mildly unsteady gait without DME, initial HHA for stability progressing to no UE support; 1x LOB requiring minA to correct, additional LOB self-corrected; pt tends to have slow, guarded speed with posterior lean, able to increase speed with cues which seemed to improve stability. Some difficulty multitasking with cognitive tasks during hallway ambulation, at times having to stop walking in order to answer question/complete task   Stairs             Wheelchair Mobility    Modified Rankin (Stroke Patients Only)       Balance Overall balance assessment: Needs assistance   Sitting balance-Leahy Scale: Good Sitting balance - Comments: able to engage in LB ADLs with supervision   Standing balance support: Single extremity supported;During functional activity;No upper extremity supported Standing balance-Leahy Scale: Fair                              Cognition Arousal/Alertness: Awake/alert Behavior During Therapy: WFL for tasks assessed/performed;Flat affect Overall Cognitive Status: Impaired/Different from baseline Area of Impairment: Attention;Memory;Following commands;Safety/judgement;Awareness;Problem solving                   Current Attention Level: Selective Memory: Decreased short-term memory Following Commands: Follows one step commands consistently Safety/Judgement: Decreased awareness of safety;Decreased awareness of deficits Awareness: Emergent Problem Solving: Slow processing;Requires verbal cues General Comments: Improving cognition and desire to participate; seems to respond well to encouragement from mother and therapists. Noted some difficulty multitasking during mobility tasks        Exercises  General Comments General comments (skin integrity, edema, etc.):  pt's mother present and supportive - able to verify she will be available for initial 24/7 support, clarified DME needs. Recommend pt continue to use RW for safety when mobilizing with nursing      Pertinent Vitals/Pain Pain Assessment: No/denies pain Pain Intervention(s): Monitored during session    Home Living                          Prior Function            PT Goals (current goals can now be found in the care plan section) Progress towards PT goals: Progressing toward goals    Frequency    Min 3X/week      PT Plan Current plan remains appropriate    Co-evaluation              AM-PAC PT "6 Clicks" Mobility   Outcome Measure  Help needed turning from your back to your side while in a flat bed without using bedrails?: None Help needed moving from lying on your back to sitting on the side of a flat bed without using bedrails?: A Little Help needed moving to and from a bed to a chair (including a wheelchair)?: A Little Help needed standing up from a chair using your arms (e.g., wheelchair or bedside chair)?: A Little Help needed to walk in hospital room?: A Little Help needed climbing 3-5 steps with a railing? : A Little 6 Click Score: 19    End of Session Equipment Utilized During Treatment: Gait belt Activity Tolerance: Patient tolerated treatment well Patient left: in bed;with call bell/phone within reach;with family/visitor present Nurse Communication: Mobility status;Other (comment) (RN with pt's mom helping her to/from [facility]) PT Visit Diagnosis: Other abnormalities of gait and mobility (R26.89);Muscle weakness (generalized) (M62.81);Other symptoms and signs involving the nervous system (R29.898)     Time: 7371-0626 PT Time Calculation (min) (ACUTE ONLY): 27 min  Charges:  $Gait Training: 8-22 mins                     Ina Homes, PT, DPT Acute Rehabilitation Services  Pager [phone] Office [phone]  Annette [name] 09/13/2021,  12:19 PM

## 2021-09-13 NOTE — Progress Notes (Signed)
ANTICOAGULATION CONSULT NOTE  Pharmacy Consult:  Heparin Indication: pulmonary embolus and DVT  No Known Allergies  Patient Measurements: Height: 5\' 6"  (167.6 cm) Weight: 93.6 kg (206 lb 5.6 oz) IBW/kg (Calculated) : 59.3 Heparin Dosing Weight:  78 kg  Vital Signs: Temp: 98.1 F (36.7 C) (12/11 2338) Temp Source: Oral (12/11 2338) BP: 141/75 (12/11 2338) Pulse Rate: 100 (12/11 2338)  Labs: Recent Labs    09/11/21 0951 09/11/21 1638 09/12/21 0705 09/13/21 0216  HGB 10.9*  --  10.7* 10.4*  HCT 33.0*  --  31.9* 32.0*  PLT 142*  --  177 250  HEPARINUNFRC <0.10* 0.37 0.51 <0.10*  CREATININE 5.00*  --  4.05*  --      Estimated Creatinine Clearance: 23.8 mL/min (A) (by C-G formula based on SCr of 4.05 mg/dL (H)).   Assessment: 32 YOF presented on 12/5 with AMS, DKA and bacteremia. Patient has a history of PE in October 2022 and DVT. She was seen in the ED on 12/4 for hemoptysis/hematemesis and left AMA. No hemoptysis observed thus far this admission and CBC is stable. Pharmacy consulted for IV heparin dosing.   -heparin level < 0.1 (previous was 0.51 on 2000 units/hr) -hg= 10.4  Per RN the patient pulled out her line and the infusion will be restarted soon  Goal of Therapy:  Heparin level 0.3-0.7 units/ml Monitor platelets by anticoagulation protocol: Yes   Plan:  --heparin bolus 2000 units then continue 2000 units/hr -Heparin level in 8 hours and daily wth CBC daily  14/4, PharmD Clinical Pharmacist **Pharmacist phone directory can now be found on [email or web address] ([email or web address]).  Listed under [facility].

## 2021-09-14 ENCOUNTER — Other Ambulatory Visit (HOSPITAL_COMMUNITY): Payer: Self-pay

## 2021-09-14 LAB — RENAL FUNCTION PANEL
Albumin: 2 g/dL — ABNORMAL LOW (ref 3.5–5.0)
Anion gap: 7 (ref 5–15)
BUN: 31 mg/dL — ABNORMAL HIGH (ref 6–20)
CO2: 22 mmol/L (ref 22–32)
Calcium: 7.3 mg/dL — ABNORMAL LOW (ref 8.9–10.3)
Chloride: 123 mmol/L — ABNORMAL HIGH (ref 98–111)
Creatinine, Ser: 2.74 mg/dL — ABNORMAL HIGH (ref 0.44–1.00)
GFR, Estimated: 23 mL/min — ABNORMAL LOW (ref >60)
Glucose, Bld: 130 mg/dL — ABNORMAL HIGH (ref 70–99)
Phosphorus: 3.2 mg/dL (ref 2.5–4.6)
Potassium: 3.3 mmol/L — ABNORMAL LOW (ref 3.5–5.1)
Sodium: 152 mmol/L — ABNORMAL HIGH (ref 135–145)

## 2021-09-14 LAB — CBC
HCT: 33 % — ABNORMAL LOW (ref 36.0–46.0)
Hemoglobin: 10.3 g/dL — ABNORMAL LOW (ref 12.0–15.0)
MCH: 27.1 pg (ref 26.0–34.0)
MCHC: 31.2 g/dL (ref 30.0–36.0)
MCV: 86.8 fL (ref 80.0–100.0)
Platelets: 351 10*3/uL (ref 150–400)
RBC: 3.8 MIL/uL — ABNORMAL LOW (ref 3.87–5.11)
RDW: 15.7 % — ABNORMAL HIGH (ref 11.5–15.5)
WBC: 11.8 10*3/uL — ABNORMAL HIGH (ref 4.0–10.5)
nRBC: 0 % (ref 0.0–0.2)

## 2021-09-14 LAB — GLUCOSE, CAPILLARY: Glucose-Capillary: 125 mg/dL — ABNORMAL HIGH (ref 70–99)

## 2021-09-14 MED ORDER — INSULIN DETEMIR 100 UNIT/ML FLEXPEN: PEN_INJECTOR | SUBCUTANEOUS | 11 refills | Status: DC

## 2021-09-14 MED ORDER — CEFAZOLIN SODIUM-DEXTROSE 2-4 GM/100ML-% IV SOLN
2.0000 g | Freq: Two times a day (BID) | INTRAVENOUS | Status: DC
  Administered 2021-09-14: 2 g via INTRAVENOUS
  Filled 2021-09-14: qty 100

## 2021-09-14 MED ORDER — RIVAROXABAN 20 MG PO TABS
20.0000 mg | ORAL_TABLET | Freq: Every day | ORAL | 0 refills | Status: AC
Start: 2021-09-14 — End: ?

## 2021-09-14 MED ORDER — CALCIUM CARBONATE ANTACID 500 MG PO CHEW: 1.0000 | CHEWABLE_TABLET | Freq: Four times a day (QID) | ORAL | 0 refills | Status: AC | PRN

## 2021-09-14 MED ORDER — INSULIN DETEMIR 100 UNIT/ML FLEXPEN
PEN_INJECTOR | SUBCUTANEOUS | 11 refills | Status: DC
Start: 2021-09-14 — End: 2021-09-16

## 2021-09-14 MED ORDER — POTASSIUM CHLORIDE CRYS ER 20 MEQ PO TBCR
40.0000 meq | EXTENDED_RELEASE_TABLET | Freq: Once | ORAL | Status: AC
  Administered 2021-09-14: 40 meq via ORAL
  Filled 2021-09-14: qty 2

## 2021-09-14 NOTE — Discharge Instructions (Addendum)
Dear Annette [name], I am so glad you are feeling better and can be discharged today (09/14/2021)! You were admitted because of severe diabetic ketoacidosis that required ICU stay along with bacteria in your blood and kidney damage.   Please see the following instructions: Please see your primary care / family doctor within 1 WEEK of leaving the hospital for a HOSPITAL FOLLOW-UP APPOINTMENT. For your KIDNEY DAMAGE please call [phone] to make an appointment with [facility] within 7 DAYS of leaving the hospital. Please continue your other home meds as you were prior to hospitalization.  It was a pleasure meeting you, Annette [name].  I wish you and your family the best, and hope you stay happy and healthy!  Princess Bruins, DO 09/14/2021

## 2021-09-14 NOTE — Progress Notes (Signed)
FPTS Brief Progress Note  S: Patient seen at bedside for evening rounds. She had just ambulated back from the bathroom. Denies complaints at this time. Has several cups on her bedside table- notes she has been trying to hydrate. Nurse tech reports her HR has been in the 100s this entire shift. Sinus tachy on the monitor on my evaluation. Patient denies palpitations, chest pain, or shortness of breath.   O: BP 135/84 (BP Location: Right Arm)   Pulse (!) 119   Temp 98.4 F (36.9 C) (Oral)   Resp 19   Ht 5\' 6"  (1.676 m)   Wt 93.8 kg   SpO2 96%   BMI 33.38 kg/m   Gen: alert, well-appearing, NAD Resp: normal work of breathing on room air Neuro: normal gait, normal speech  A/P: -Continue current management per day team progress note -Monitor HR  - Orders reviewed. Labs for AM ordered, which was adjusted as needed.    , MD 09/14/2021, 12:22 AM PGY-2, [facility] Night Resident  Please page [phone] with questions.

## 2021-09-14 NOTE — Progress Notes (Signed)
Patient is leaving the hospital AMA. Patient is currently listed as being under the care of the [facility] outpatient, she has been seen in our clinic fot a single hospital follow-up on 08/04/2021. Patient has declined staying in the hospital further despite concerns for her overall health and we are concerned that we may not be able to continue to care for her if she continues to decline the appropriate care for her conditions. When a team member was discussing this with the patient, patient mentioned visiting the clinic [facility]) where she received free Lantus previously; it will be a concern that our clinic will not be able to continuously provide free Lantus or other medications, if that is the patient's request. I discussed the case with our office manager and if the patient is not present at the scheduled visit on 12/15, we may need to send a certified letter regarding this matter and possible dismissal from the practice    Jaheem Hedgepath, DO

## 2021-09-14 NOTE — Progress Notes (Signed)
Inpatient Diabetes Program Recommendations  AACE/ADA: New Consensus Statement on Inpatient Glycemic Control (2015)  Target Ranges:  Prepandial:   less than 140 mg/dL      Peak postprandial:   less than 180 mg/dL (1-2 hours)      Critically ill patients:  140 - 180 mg/dL   Lab Results  Component Value Date   GLUCAP 125 (H) 09/14/2021   HGBA1C 12.6 (H) 09/07/2021    Review of Glycemic Control  Diabetes history: DM2 Outpatient Diabetes medications: Lantus 30 units QD, metformin 500 mg with breakfast Current orders for Inpatient glycemic control: Levemir 15 units BID, Novolog 0-20 units TID with meals  HgbA1C - 12.6%  Inpatient Diabetes Program Recommendations:    Pt will likely need affordable insulin regimen such as 70/30 due to no insurance coverage.  Novolin ReliOn 70/30 Flexpen Insulin pen (Order # [id number]) Pen needles (# [id number]) TOC is following.  Blood sugars acceptable at present.  Thank you. Ailene Ards, RD, LDN, CDE Inpatient Diabetes Coordinator [phone]

## 2021-09-14 NOTE — Discharge Summary (Addendum)
[facility] Leave Summary  Patient name: Annette [name] Medical record number: [id number] Date of birth: 04-18-1991 Age: 30 y.o. Gender: female Date of Admission: 09/06/2021  Date of AMA Leave: 09/14/2021 Admitting Physician: Marshell Garfinkel, MD  Primary Care Provider: Gifford Shave, MD Consultants: PCCM, Nephrology, Psychiatry  Indication for Hospitalization:  Altered Mental Status 2/2 DKA   AMA Leave Diagnoses/Problem List:  Principal Problem:   DKA (diabetic ketoacidosis) ([place]) Active Problems:   Type 2 diabetes mellitus with insulin therapy ([place])   Recurrent pulmonary embolism ([place])   AKI (acute kidney injury) ([place])   Protein-calorie malnutrition, severe   Diabetic polyneuropathy associated with type 2 diabetes mellitus ([place])   Suicide ideation   Intractable vomiting with nausea   Hypernatremia   MDD (major depressive disorder)   Disposition:  Home  AMA Leave Condition:  Hypernatremic Incomplete last day of antibiotic  AMA Leave Exam:  Temp:  [97.9 F (36.6 C)-98.4 F (36.9 C)] 97.9 F (36.6 C) (12/13 0750) Pulse Rate:  [97-119] 97 (12/13 0750) Resp:  [15-20] 20 (12/13 0750) BP: (124-150)/(84-92) 124/88 (12/13 0750) SpO2:  [96 %-100 %] 99 % (12/13 0750) Weight:  [92.2 kg] 92.2 kg (12/13 0500)  Physical Exam Vitals and nursing note reviewed.  HENT:     Head: Normocephalic and atraumatic.  Cardiovascular:     Rate and Rhythm: Regular rhythm. Tachycardia present.     Heart sounds: Normal heart sounds. No murmur heard.   No friction rub. No gallop.  Pulmonary:     Effort: Pulmonary effort is normal. No respiratory distress.     Breath sounds: Normal breath sounds. No wheezing or rales.  Skin:    General: Skin is warm and dry.  Neurological:     Mental Status: She is alert and oriented to person, place, and time.    Brief Hospital Course:  Annette [name] is a 30 y.o. female presenting with Altered Mental Status, DKA and  bacteremia. PMHx is significant for poorly controlled T2DM, submassive PE x2 (October 2022, 1st COVID, 2nd unprovoked), previous DVT RLE  ED Course: Patient presented via EMS 12/4 for hemoptysis/hematemesis while on anticoagulation and left AMA, then returned via EMS 12/5 for AMS, DKA, tachypnea and bacteremia.  ICU Course: Admitted to ICU 12/5 for DKA, dehydration, AKI, poor access s/p femoral CVL, +UTI/ Bcx + e.coli. AG closed, received Rocephin (12/6-12/8), and on heparin gtt, then started IV cefazolin.   Brief Hospital Course: Continued IV cefazolin. Consulted nephrology for management of nausea, hypernatremia and acute renal failure with regular labs, IVF and PO intake. Patient then left without completing treatment  Consults: PCCM, Nephro, Psych  Plan by Problem: Uncontrolled T2DM   DKA, RESOLVED Acute metabolic encephalopathy, RESOLVED  Patient was admitted in DKA with a pH of 7.0 and was placed in the ICU where she was on insulin drip until resolution. Patient was successfully transitioned to Levemir while in the hospital prior to her elopement. DKA was likely precipitated from UTI/bacteremia that was identified on blood cultures.  A1c 12.6 (09/07/2021), last CBG 130 (IP CBG goal <180). Resistant SSI + Levemir 15 units BID during hospitalization CGBs  Instructed patient to continue with detemir 15 units BID   Hypernatremia   Possible DI  Na+ 150-154 for about 1wk.  Mild hypokalemia 2/2 insulin which was repleted. Suspect 2/2 corrected severe hyperglycemia. Encouraging patient to increase p.o. intake.  Per nephrology, encourage patient to drink free water    Non-oliguric acute renal failure   hypocalcemia  Likely ATN 2/2 DKA/osmotic diuresis, decreased oral intake, sepsis. Cr 3.46 on admission, Cr 6.0 in the ICU, baseline Cr 0.7.  Kidney function continue improvements with adequate p.o. intake and supplemental IVF, no indication for dialysis. Cr (!) 2.74 on day of AMA leave. corrected  Ca2+ 8.9 2/2 AKI and expected to improve with improving renal function. Autoimmune and vasculitis work-up negative No acidosis, continues to be mildly hypokalemic possibly 2/2 insulin  E. Coli Bacteremia Blood cultures resulted positive for E. Coli bacteremia and also had acute renal failure. Patient was treated with CTX x3 days and was switched to Ancef for 5 days. Patient completed her dose of antibiotics prior to elopement.   MDD/GAD   SI/HI Patient endorsed SI/HI during hospitalization started on zoloft, however she refused it x2 days, so it was d/c'd.  Psychiatry consulted then signed off 1:1 sitter for SI/HI, elopement  Issues for Follow Up:  T2DM.  Patient presented for DKA, currently on detemir 15 units BID and metformin 500 mg XR daily. Patient may require transition to more affordable medications such as 70/30 as patient appears to currently only have family planning Medicaid. Persistent nausea. Noted during most of hospitalization, consider if gastroparesis evaluation would be beneficial. Patient needs ophthalmologic examination.  Hx of PE/DVT.  On Xarelto 20 mg daily with supper, reports that she does not take this at home, patient education was provided.  AKI & hypernatremia 2/2 DKA/osmotic diuresis, severe dehydration.  Please follow up with BMP Ensure that patient follows-up with Nephrology outpatient for further management. Patient left AMA and has close follow-up scheduled. If patient does not show to the appointment then a certified letter is to be sent as we will not be able to care for the patient if she is refusing appropriate care. (Please see note in the chart on day of her elopement.)  Significant Procedures:  Central venous catheter placed  Significant Labs and Imaging:  Recent Labs  Lab 09/12/21 0705 09/13/21 0216 09/14/21 0756  WBC 10.6* 11.0* 11.8*  HGB 10.7* 10.4* 10.3*  HCT 31.9* 32.0* 33.0*  PLT 177 250 351   Recent Labs  Lab 09/08/21 0445  09/08/21 1554 09/10/21 0336 09/10/21 1824 09/11/21 0951 09/11/21 1638 09/12/21 0705 09/13/21 0216 09/14/21 0237  NA 151*   < > 152*  --  154*  --  152* 150* 152*  K 4.6   < > 3.7  --  3.6  --  3.2* 3.2* 3.3*  CL 121*   < > 123*  --  122*  --  122* 121* 123*  CO2 18*   < > 16*  --  20*  --  22 21* 22  GLUCOSE 310*   < > 93  --  198*  --  133* 108* 130*  BUN 61*   < > 82*  --  74*  --  59* 42* 31*  CREATININE 6.22*   < > 6.43*  --  5.00*  --  4.05* 3.46* 2.74*  CALCIUM 8.5*   < > 7.1*  --  7.4*  --  7.0* 6.9* 7.3*  MG  --   --  2.4 2.2 2.3 2.3 2.5*  --   --   PHOS  --    < > 2.2*   2.2* 3.4 2.0*   2.1* 3.0 3.7   3.6 3.3 3.2  ALKPHOS 113  --   --   --   --   --   --   --   --  AST 23  --   --   --   --   --   --   --   --   ALT 28  --   --   --   --   --   --   --   --   ALBUMIN 1.6*   < > <1.5*  --  2.4*  --  2.1* 2.1* 2.0*   < > = values in this interval not displayed.   Results for orders placed or performed during the hospital encounter of 09/06/21 (from the past 24 hour(s))  Glucose, capillary     Status: Abnormal   Collection Time: 09/13/21  4:18 PM  Result Value Ref Range   Glucose-Capillary 146 (H) 70 - 99 mg/dL  Glucose, capillary     Status: None   Collection Time: 09/13/21  8:58 PM  Result Value Ref Range   Glucose-Capillary 94 70 - 99 mg/dL  Renal function panel     Status: Abnormal   Collection Time: 09/14/21  2:37 AM  Result Value Ref Range   Sodium 152 (H) 135 - 145 mmol/L   Potassium 3.3 (L) 3.5 - 5.1 mmol/L   Chloride 123 (H) 98 - 111 mmol/L   CO2 22 22 - 32 mmol/L   Glucose, Bld 130 (H) 70 - 99 mg/dL   BUN 31 (H) 6 - 20 mg/dL   Creatinine, Ser 2.74 (H) 0.44 - 1.00 mg/dL   Calcium 7.3 (L) 8.9 - 10.3 mg/dL   Phosphorus 3.2 2.5 - 4.6 mg/dL   Albumin 2.0 (L) 3.5 - 5.0 g/dL   GFR, Estimated 23 (L) >60 mL/min   Anion gap 7 5 - 15  Glucose, capillary     Status: Abnormal   Collection Time: 09/14/21  6:06 AM  Result Value Ref Range   Glucose-Capillary 125  (H) 70 - 99 mg/dL  CBC     Status: Abnormal   Collection Time: 09/14/21  7:56 AM  Result Value Ref Range   WBC 11.8 (H) 4.0 - 10.5 K/uL   RBC 3.80 (L) 3.87 - 5.11 MIL/uL   Hemoglobin 10.3 (L) 12.0 - 15.0 g/dL   HCT 33.0 (L) 36.0 - 46.0 %   MCV 86.8 80.0 - 100.0 fL   MCH 27.1 26.0 - 34.0 pg   MCHC 31.2 30.0 - 36.0 g/dL   RDW 15.7 (H) 11.5 - 15.5 %   Platelets 351 150 - 400 K/uL   nRBC 0.0 0.0 - 0.2 %    US RENAL  Result Date: 09/07/2021 CLINICAL DATA:  Acute kidney injury EXAM: RENAL / URINARY TRACT ULTRASOUND COMPLETE COMPARISON:  None. FINDINGS: Right Kidney: Renal measurements: 12.3 x 6.7 x 6.2 = volume: 264 mL. Echogenicity within normal limits. No mass or hydronephrosis visualized. Left Kidney: Renal measurements: 10.6 x 4.7 x 5.1 = volume: 133 mL. Echogenicity within normal limits. No mass or hydronephrosis visualized. Bladder: Appears normal for degree of bladder distention. Echogenic debris in the dependent urinary bladder. Other: None. IMPRESSION: 1.  No evidence of nephrolithiasis or hydronephrosis. 2. No urinary bladder wall thickening. Echogenic debris in the dependent urinary bladder. Electronically Signed   By: Keane Police D.O.   On: 09/07/2021 10:38   DG Chest Portable 1 View  Result Date: 09/06/2021 CLINICAL DATA:  Reason for exam: AMS Patient non verbal throughout exam. Hx of diabetes. No known heart or lung surgeries. Per ed triage notes: "Pt bib [facility] with complaints of increased ams. Pt arrives tachypnic and  only alert to name. EXAM: PORTABLE CHEST - 1 VIEW COMPARISON:  07/29/2021 FINDINGS: Relatively low lung volumes.  No focal infiltrate or overt edema. Heart size and mediastinal contours are within normal limits. No effusion.  No pneumothorax. Visualized bones unremarkable. IMPRESSION: Low volumes.  No acute cardiopulmonary disease. Electronically Signed   By: Lucrezia Europe M.D.   On: 09/06/2021 12:31   DG Abd Portable 1V  Result Date: 09/10/2021 CLINICAL DATA:  Feeding  tube placement EXAM: PORTABLE ABDOMEN - 1 VIEW COMPARISON:  None. FINDINGS: Weighted tip feeding tube has been advanced into the proximal jejunum beyond the ligament of Treitz. Visualized bowel gas pattern is normal. Regional bones unremarkable. Visualized lung bases clear. IMPRESSION: Feeding tube advancement to proximal jejunum. Electronically Signed   By: Lucrezia Europe M.D.   On: 09/10/2021 10:01   US Abdomen Limited RUQ (LIVER/GB)  Result Date: 09/06/2021 CLINICAL DATA:  Transaminitis EXAM: ULTRASOUND ABDOMEN LIMITED RIGHT UPPER QUADRANT COMPARISON:  None. FINDINGS: Gallbladder: No gallstones or wall thickening visualized. No sonographic Murphy sign noted by sonographer. Common bile duct: Diameter: 3.6 mm Liver: No focal lesion identified. Within normal limits in parenchymal echogenicity. Portal vein is patent on color Doppler imaging with normal direction of blood flow towards the liver. Other: None. IMPRESSION: Normal right upper quadrant ultrasound Electronically Signed   By: Maurine Simmering M.D.   On: 09/06/2021 21:21     Results/Tests Pending at Time of AMA Leave:  Unresulted Labs (From admission, onward)     Start     Ordered   09/13/21 0500  Renal function panel  Daily,   R     Question:  Specimen collection method  Answer:  Lab=Lab collect   09/12/21 1523             AMA Leave Medications:  Allergies as of 09/14/2021   No Known Allergies      Medication List     STOP taking these medications    insulin glargine 100 UNIT/ML Solostar Pen Commonly known as: LANTUS   Xarelto Starter Pack Generic drug: Rivaroxaban Stater Pack (15 mg and 20 mg) Replaced by: rivaroxaban 20 MG Tabs tablet       TAKE these medications    Accu-Chek Guide test strip Generic drug: glucose blood Use to test blood sugars up to 4 times daily as directed.   Accu-Chek Guide w/Device Kit Use to test blood sugars up to 4 times daily as directed.   Accu-Chek Softclix Lancets lancets Use as  directed.   calcium carbonate 500 MG chewable tablet Commonly known as: TUMS - dosed in mg elemental calcium Chew 1 tablet (200 mg of elemental calcium total) by mouth 4 (four) times daily as needed for indigestion or heartburn.   insulin detemir 100 UNIT/ML FlexPen Commonly known as: LEVEMIR Take Detemir 15 units in the MORNING every day. Take Detemir 15 units in the EVENING every day.   metFORMIN 500 MG 24 hr tablet Commonly known as: Glucophage XR Take 1 tablet (500 mg total) by mouth daily with breakfast.   Pentips 32G X 4 MM Misc Generic drug: Insulin Pen Needle Use to inject insulin as directed up to 4 times daily.   rivaroxaban 20 MG Tabs tablet Commonly known as: XARELTO Take 1 tablet (20 mg total) by mouth daily with supper. Replaces: Xarelto Starter Pack        AMA Leave Instructions:  Please refer to Patient Instructions section of EMR for full details.  Patient was counseled important signs  and symptoms that should prompt return to medical care, changes in medications, dietary instructions, activity restrictions, and follow up appointments.   Follow-Up Appointments:  Follow-up Information     [place] [place] Follow up.   Why: Please call and make an appointment within the next 1-2 weeks. Contact information: [place] [address] [phone]         Gifford Shave, MD. Go to.   Specialty: Family Medicine Contact information: [address] [place] [address] [phone]                 Julie Nguyen, DO 09/14/2021, 1:41 PM PGY-1, [place] Upper-Level Resident Addendum   I have independently interviewed and examined the patient. I have discussed the above with the original author and agree with their documentation. My edits for correction/addition/clarification are in within the document. Please see also any attending notes.   Rise Patience, DO  PGY-2, [place] [facility] 09/14/2021 3:41 PM  FPTS Service pager: [phone] (text pages welcome through [facility])

## 2021-09-14 NOTE — Progress Notes (Signed)
Family Medicine Teaching Service Daily Progress Note Intern Pager: [phone]  Patient name: Annette [name] Medical record number: [id number] Date of birth: [birth year] Age: 30 y.o. Gender: female  Primary Care Provider: Derrel Nip, MD Consultants: Nephrology, PCCM, Psychiatry Code Status: Full Code   Pt Overview and Major Events to Date:  12/5: Admitted to ICU in DKA with AKI and UTI, blood cultures growing E. coli, acute renal failure 12/6: Off Endo tool but was still encephalopathic, started on Rocephin 12/10: Transferred out of the ICU to FPTS 12/13: Pt wishing to leave AMA  Assessment and Plan: Annette [name] is a 30 y.o. female presented with Altered Mental Status in DKA and E. Coli bacteremia 2/2 UTI. PMHx of PMH of T2DM, history of DVT, recent submassive PE (October 2020), MDD/GAD. HD 8   Leaving AMA Patient is requesting to leave AMA. Patient stated that she understands the treatment plans currently and that she understands the risks of leaving before treatment is completed. However, she stated that she will stay until her antimicrobial treatment for the day is over. She agreed to close f/u with her PCP and nephrology outpatient after hospitalization.  Uncontrolled T2DM  DKA, RESOLVED  A1c 12.6 (09/07/2021), last CBG 130 (IP CBG goal <180). Received 17 u of short acting and 30 u of basal. Resistant SSI CGBs    E.coli bacteriemia Afebrile and WBC (!) 11.0. Today is the last day of antimicrobial. IV Ancef day 8 (end 12/13) CBC   Hx of DVT/PE Xarelto 20 mg daily   Hypernatremia  Possible DI  Na+ 152, K+ 3.3 Management per nephrology RPF   AKI Cr (!) 2.74 <3.46, baseline Cr 0.7, UOP 4.75 L Management per nephrology RPF   Nausea Patient did not complain of nausea and did not ask for PRNs. Reglan 10 mg every 8 hours as needed   MDD  HI Patient denied SI/HI  FEN/GI: Full liquid  PPx: SCDs Start: 09/06/21 1303 Xarelto 20 mg daily Dispo: HH PT pending  clinical improvement . Barriers include medical stability.   Subjective:  Annette [name] was seen this AM. Stated that she is feels good, and that she wants to go home. Discussed with patient the risk of leaving AMA, was able to verbalize the risks back, and continues to leave before completion of treatment.   Objective: Temp:  [97.9 F (36.6 C)-98.4 F (36.9 C)] 98.1 F (36.7 C) (12/13 0330) Pulse Rate:  [99-119] 104 (12/13 0330) Resp:  [15-19] 15 (12/13 0330) BP: (135-150)/(84-92) 137/92 (12/13 0330) SpO2:  [96 %-100 %] 99 % (12/13 0330) Weight:  [92.2 kg] 92.2 kg (12/13 0500) Physical Exam Vitals and nursing note reviewed.  HENT:     Head: Normocephalic and atraumatic.  Cardiovascular:     Rate and Rhythm: Regular rhythm. Tachycardia present.     Heart sounds: Normal heart sounds. No murmur heard.   No friction rub. No gallop.  Pulmonary:     Effort: Pulmonary effort is normal. No respiratory distress.     Breath sounds: Normal breath sounds. No wheezing or rales.  Skin:    General: Skin is warm and dry.  Neurological:     Mental Status: She is alert and oriented to person, place, and time.    Laboratory: Recent Labs  Lab 09/11/21 0951 09/12/21 0705 09/13/21 0216  WBC 8.4 10.6* 11.0*  HGB 10.9* 10.7* 10.4*  HCT 33.0* 31.9* 32.0*  PLT 142* 177 250   Recent Labs  Lab 09/08/21 0445 09/08/21 1554 09/12/21 0705 09/13/21 0216  09/14/21 0237  NA 151*   < > 152* 150* 152*  K 4.6   < > 3.2* 3.2* 3.3*  CL 121*   < > 122* 121* 123*  CO2 18*   < > 22 21* 22  BUN 61*   < > 59* 42* 31*  CREATININE 6.22*   < > 4.05* 3.46* 2.74*  CALCIUM 8.5*   < > 7.0* 6.9* 7.3*  PROT 5.9*  --   --   --   --   BILITOT 0.4  --   --   --   --   ALKPHOS 113  --   --   --   --   ALT 28  --   --   --   --   AST 23  --   --   --   --   GLUCOSE 310*   < > 133* 108* 130*   < > = values in this interval not displayed.   Results for orders placed or performed during the hospital encounter of 09/06/21  (from the past 24 hour(s))  Glucose, capillary     Status: Abnormal   Collection Time: 09/13/21  8:09 AM  Result Value Ref Range   Glucose-Capillary 105 (H) 70 - 99 mg/dL  Glucose, capillary     Status: Abnormal   Collection Time: 09/13/21 11:55 AM  Result Value Ref Range   Glucose-Capillary 299 (H) 70 - 99 mg/dL  Glucose, capillary     Status: Abnormal   Collection Time: 09/13/21  4:18 PM  Result Value Ref Range   Glucose-Capillary 146 (H) 70 - 99 mg/dL  Glucose, capillary     Status: None   Collection Time: 09/13/21  8:58 PM  Result Value Ref Range   Glucose-Capillary 94 70 - 99 mg/dL  Renal function panel     Status: Abnormal   Collection Time: 09/14/21  2:37 AM  Result Value Ref Range   Sodium 152 (H) 135 - 145 mmol/L   Potassium 3.3 (L) 3.5 - 5.1 mmol/L   Chloride 123 (H) 98 - 111 mmol/L   CO2 22 22 - 32 mmol/L   Glucose, Bld 130 (H) 70 - 99 mg/dL   BUN 31 (H) 6 - 20 mg/dL   Creatinine, Ser 7.98 (H) 0.44 - 1.00 mg/dL   Calcium 7.3 (L) 8.9 - 10.3 mg/dL   Phosphorus 3.2 2.5 - 4.6 mg/dL   Albumin 2.0 (L) 3.5 - 5.0 g/dL   GFR, Estimated 23 (L) >60 mL/min   Anion gap 7 5 - 15  Glucose, capillary     Status: Abnormal   Collection Time: 09/14/21  6:06 AM  Result Value Ref Range   Glucose-Capillary 125 (H) 70 - 99 mg/dL   Imaging/Diagnostic Tests: No results found.   Princess Bruins, DO 09/14/2021, 7:20 AM PGY-1, [facility] FPTS Intern pager: [phone], text pages welcome

## 2021-09-14 NOTE — TOC Progression Note (Signed)
Transition of Care [facility]) - Progression Note    Patient Details  Name: Rajvi Armentor MRN: [id number] Date of Birth: 1991-07-25  [facility] CM/SW Contact  Beckie Busing, RN Phone Number:[phone]  09/14/2021, 12:22 PM  Clinical Narrative:    CM received message from nurse stating that patient is requesting info on local providers because patient does not have PCP. Case manager attempted to contact patient with no response. CM then read progress note stating that patient has left AMA. CM unable to contact patient to provide PCP info. TOC will sign off.    Expected Discharge Plan: Home w Home Health Services Barriers to Discharge: Continued Medical Work up  Expected Discharge Plan and Services Expected Discharge Plan: Home w Home Health Services In-house Referral: Clinical Social Work Discharge Planning Services: CM Consult Post Acute Care Choice: Home Health                             HH Arranged: PT           Social Determinants of Health (SDOH) Interventions    Readmission Risk Interventions No flowsheet data found.

## 2021-09-14 NOTE — Progress Notes (Signed)
[facility] NEPHROLOGY PROGRESS NOTE  Assessment/ Plan:  Annette [name] is a 30 y.o. y.o. person with uncontrolled type II diabetes mellitus, history of DVT/PE admitted 12/5 to ICU for acute metabolic encephalopathy in setting of diabetic ketoacidosis and acute renal failure, now on the floor being managed by FPTS.   #Non-oliguric acute renal failure, likely ATN 2/2 DKA/osmotic diuresis, severe dehydration - Continues to have appropriate urine output, likely representing post-ATN diuresis - Renal function continues to improve, now sCr 2.74<3.46, GFR 23>16 - No acidosis, continues to be mildly hypokalemic possibly 2/2 insulin- - No indication for dialysis, expect renal function to continue to improve with adequate PO intake - Patient would like to leave AMA today after her last dose of antibiotics - Have given instructions to FPTS and on AVS for patient to follow-up with [facility] in 1-2 weeks. Please have patient call  [phone] to make an appointment  #Hypernatremia - Stable, Na has been 150-154 consistently for last week or so - Would continue to encourage Ms. Orlowski to drink free water upon discharge  #Hypocalcemia - Corrected calcium 8.9 this morning - Follow-up BMP at follow-up appointment  #E. Coli bacteremia - Remains afebrile, receiving last dose of antibiotics today   #Uncontrolled type II diabetes mellitus - Counseled patient regarding glycemic control and importance to avoid chronic renal disease - Patient to follow-up with PCP and [facility]  Subjective:    This morning, patient reports she is doing well and is ready to discharge home. She denies abdominal pain, nausea, vomiting, swelling.  Objective Vital signs in last 24 hours: Vitals:   09/13/21 2351 09/14/21 0330 09/14/21 0500 09/14/21 0750  BP: 135/84 (!) 137/92  124/88  Pulse: (!) 119 (!) 104  97  Resp: 19 15  20   Temp: 98.4 F (36.9 C) 98.1 F (36.7 C)   97.9 F (36.6 C)  TempSrc: Oral Oral  Oral  SpO2: 96% 99%  99%  Weight:   92.2 kg   Height:       Weight change: -1.6 kg  Intake/Output Summary (Last 24 hours) at 09/14/2021 1053 Last data filed at 09/14/2021 09/16/2021 Gross per 24 hour  Intake 2003.98 ml  Output 4050 ml  Net -2046.02 ml    Labs: Basic Metabolic Panel: Recent Labs  Lab 09/12/21 0705 09/13/21 0216 09/14/21 0237  NA 152* 150* 152*  K 3.2* 3.2* 3.3*  CL 122* 121* 123*  CO2 22 21* 22  GLUCOSE 133* 108* 130*  BUN 59* 42* 31*  CREATININE 4.05* 3.46* 2.74*  CALCIUM 7.0* 6.9* 7.3*  PHOS 3.7   3.6 3.3 3.2   Liver Function Tests: Recent Labs  Lab 09/08/21 0445 09/09/21 0634 09/12/21 0705 09/13/21 0216 09/14/21 0237  AST 23  --   --   --   --   ALT 28  --   --   --   --   ALKPHOS 113  --   --   --   --   BILITOT 0.4  --   --   --   --   PROT 5.9*  --   --   --   --   ALBUMIN 1.6*   < > 2.1* 2.1* 2.0*   < > = values in this interval not displayed.   No results for input(s): LIPASE, AMYLASE in the last 168 hours. No results for input(s): AMMONIA in the last 168 hours. CBC: Recent Labs  Lab 09/10/21 0336 09/11/21 0951 09/12/21 14/11/22  09/13/21 0216 09/14/21 0756  WBC 8.8 8.4 10.6* 11.0* 11.8*  HGB 10.6* 10.9* 10.7* 10.4* 10.3*  HCT 31.7* 33.0* 31.9* 32.0* 33.0*  MCV 84.3 83.1 84.4 85.3 86.8  PLT 96* 142* 177 250 351   Cardiac Enzymes: Recent Labs  Lab 09/08/21 1553  CKTOTAL 65   CBG: Recent Labs  Lab 09/13/21 0809 09/13/21 1155 09/13/21 1618 09/13/21 2058 09/14/21 0606  GLUCAP 105* 299* 146* 94 125*    Iron Studies: No results for input(s): IRON, TIBC, TRANSFERRIN, FERRITIN in the last 72 hours. Studies/Results: No results found.  Medications: Infusions:   ceFAZolin (ANCEF) IV 2 g (09/14/21 7588)    Scheduled Medications:  Chlorhexidine Gluconate Cloth  6 each Topical Daily   feeding supplement  237 mL Oral BID BM   insulin aspart  0-20 Units Subcutaneous TID WC   insulin  detemir  15 Units Subcutaneous BID   rivaroxaban  20 mg Oral Q supper   sodium chloride flush  10-40 mL Intracatheter Q12H    have reviewed scheduled and prn medications.  Physical Exam: General: Resting comfortably, no acute distress Heart: Regular rate, rhythm. Normal S1, S2. No murmurs. Lungs: Normal respiratory effort. Clear to auscultation bilaterally. Abdomen: Soft, non-tender, non-distended Extremities: No edema Neuro: Awake, alert, conversing appropriately. No focal deficits.  Evlyn Kanner, MD Internal Medicine Resident PGY-2 Pager: [phone] 09/14/2021,10:53 AM  LOS: 8 days

## 2021-09-14 NOTE — Progress Notes (Signed)
When patient up and in bathroom H.R. goes up to S.T. 130-140 then back down to 90-low 100's. With no complaints.

## 2021-09-14 NOTE — Progress Notes (Signed)
Pt requested leave AMA. MD notified and aware. Pt signed the AMA paper. D/c tele, ivs, and foley cath.   Lawson Radar, RN

## 2021-09-16 ENCOUNTER — Other Ambulatory Visit: Payer: Self-pay | Admitting: Family Medicine

## 2021-09-16 ENCOUNTER — Ambulatory Visit (INDEPENDENT_AMBULATORY_CARE_PROVIDER_SITE_OTHER): Payer: Self-pay | Admitting: Family Medicine

## 2021-09-16 ENCOUNTER — Other Ambulatory Visit: Payer: Self-pay

## 2021-09-16 VITALS — BP 132/88 | HR 115 | Ht 66.0 in | Wt 208.4 lb

## 2021-09-16 DIAGNOSIS — N179 Acute kidney failure, unspecified: Secondary | ICD-10-CM

## 2021-09-16 DIAGNOSIS — Z794 Long term (current) use of insulin: Secondary | ICD-10-CM

## 2021-09-16 DIAGNOSIS — I2699 Other pulmonary embolism without acute cor pulmonale: Secondary | ICD-10-CM

## 2021-09-16 DIAGNOSIS — E119 Type 2 diabetes mellitus without complications: Secondary | ICD-10-CM

## 2021-09-16 MED ORDER — BASAGLAR KWIKPEN 100 UNIT/ML ~~LOC~~ SOPN: 30.0000 [IU] | PEN_INJECTOR | Freq: Every day | SUBCUTANEOUS | 11 refills | Status: AC

## 2021-09-16 NOTE — Progress Notes (Signed)
Submitted application for XARELTO to [employer] ([name]) for patient assistance.   Phone: [phone]  

## 2021-09-16 NOTE — Progress Notes (Signed)
° ° °  SUBJECTIVE:   CHIEF COMPLAINT / HPI:   Patient admitted to hospital between 12/05 and 12/13 and treated for DKA. Discharged on 12/13. Presents today for follow up. Reports no dizziness, changes in mentation, chest pain, shortness of breath, visual changes, polyuria or polydipsia.  Endorses has taken Lantus 30 u since discharge as Levemir had not been available for pick up.  Patient admits to not taking previously prescribed Lantus 30units.  Had not run out ofmedications. Requires repeat BMP today.  PERTINENT  PMH / PSH:  Non provoked PE x 2 on Xarelto Bilateral DVT DM Type 2  OBJECTIVE:   BP 132/88    Pulse (!) 115    Ht 5\' 6"  (1.676 m)    Wt 208 lb 6 oz (94.5 kg)    LMP 08/30/2021 (Approximate)    SpO2 99%    BMI 33.63 kg/m    General: Alert, no acute distress Cardio: Normal S1 and S2, RRR, no r/m/g Pulm: CTAB, normal work of breathing Abdomen: Bowel sounds normal. Abdomen soft and non-tender.  Extremities: No peripheral edema.  Neuro: Cranial nerves grossly intact   ASSESSMENT/PLAN:   Type 2 diabetes mellitus with insulin therapy (HCC) Asymptomatic today.   Discontinue Levimir Restart Lantus 30  Sample pack given today Conitue Metformin 500 mg daily Has appointment schedule with Pharm Team Jan 5 for DM management Consider GIP/GLP1 at next visit. Bmet today Strict return precautions provided Follow up with PCP in 2-4 weeks  Recurrent pulmonary embolism (HCC) Remains tachycardic withtout SOB or chest pain.  Ran out of Xarelto.  Was not able to seen by Hematology due to hospitalization. -Continue Xarelto 20 mg daily. -Samples provided 4 bottles of 7 tablets.  28 tablets total. -Advised to call and reschedule appointment with Hematology as soon as possible. -Has appointment with Pharmacy Jan 5.  Financial forms completed today for meds. -Follow up with PCP in 1-2 weeks  AKI (acute kidney injury) (HCC) Voiding well.  Has not scheduled appointment with Nepho  yet -Bmet today -Encouraged to follow up with [facility] per discharge planning -Follow up with PCP in 1-2 weeks     [place], MD [facility] Medicine [facility]

## 2021-09-16 NOTE — Patient Instructions (Addendum)
Thank you for coming to see me today. It was a pleasure. Today we talked about:   We will get some labs today.  If they are abnormal or we need to do something about them, I will call you.  If they are normal, I will send you a message on MyChart (if it is active) or a letter in the mail.  If you don't hear from [place] in 2 weeks, please call the office at the number below.   Call [facility] to set up an appointment with the Kidney Doctor.  Call Cancer center to schedule the missed appointment  Follow up with [facility] on Jan 5 for Insulin and Xarelto medications  Please follow-up with PCP in 2 months  If you have any questions or concerns, please do not hesitate to call the office at [phone].  Best,   Dana Allan, MD

## 2021-09-17 ENCOUNTER — Telehealth: Payer: Self-pay

## 2021-09-17 LAB — COMPREHENSIVE METABOLIC PANEL
ALT: 7 IU/L (ref 0–32)
AST: 20 IU/L (ref 0–40)
Albumin/Globulin Ratio: 0.7 — ABNORMAL LOW (ref 1.2–2.2)
Albumin: 2.8 g/dL — ABNORMAL LOW (ref 3.9–5.0)
Alkaline Phosphatase: 100 IU/L (ref 44–121)
BUN/Creatinine Ratio: 8 — ABNORMAL LOW (ref 9–23)
BUN: 14 mg/dL (ref 6–20)
Bilirubin Total: 0.2 mg/dL (ref 0.0–1.2)
CO2: 17 mmol/L — ABNORMAL LOW (ref 20–29)
Calcium: 8.2 mg/dL — ABNORMAL LOW (ref 8.7–10.2)
Chloride: 112 mmol/L — ABNORMAL HIGH (ref 96–106)
Creatinine, Ser: 1.72 mg/dL — ABNORMAL HIGH (ref 0.57–1.00)
Globulin, Total: 3.9 g/dL (ref 1.5–4.5)
Glucose: 559 mg/dL (ref 70–99)
Potassium: 3.8 mmol/L (ref 3.5–5.2)
Sodium: 146 mmol/L — ABNORMAL HIGH (ref 134–144)
Total Protein: 6.7 g/dL (ref 6.0–8.5)
eGFR: 41 mL/min/{1.73_m2} — ABNORMAL LOW (ref >59)

## 2021-09-17 NOTE — Telephone Encounter (Signed)
[facility] calls nurse line reporting a critical glucose level of 559 from 12/15. I attempted to call patient to discuss symptoms, however no answer and "VM has not been set up."   Results have not dropped in epic yet. [facility] reports they will fax [place] a copy.  Paged Clent Ridges.

## 2021-09-17 NOTE — Telephone Encounter (Signed)
Late documentation.   Attempted to call patient again at 11am, however no answer or option for VM. Clent Ridges was contacted and advised of critical lab value.   Clent Ridges advised me to send to [facility] as well.

## 2021-09-19 ENCOUNTER — Telehealth: Payer: Self-pay | Admitting: Family Medicine

## 2021-09-19 ENCOUNTER — Encounter: Payer: Self-pay | Admitting: Family Medicine

## 2021-09-19 NOTE — Assessment & Plan Note (Signed)
Voiding well.  Has not scheduled appointment with Nepho yet -Bmet today -Encouraged to follow up with [facility] per discharge planning -Follow up with PCP in 1-2 weeks

## 2021-09-19 NOTE — Assessment & Plan Note (Addendum)
Asymptomatic today.   Discontinue Levimir Restart Lantus 30  Sample pack given today Conitue Metformin 500 mg daily Has appointment schedule with Pharm Team Jan 5 for DM management Consider GIP/GLP1 at next visit. Bmet today Strict return precautions provided Follow up with PCP in 2-4 weeks

## 2021-09-19 NOTE — Telephone Encounter (Addendum)
[employer] for results of recent CMP. Glu 559 Na 146 Cl 112 CO2 17 Calculated AG 17  Called patient to discuss results.  No answer and unable to LVM.  Was asymptomatic at time of lab draw.  Restarted on Lantus and has close follow up.  Strict return precautions were provided at that time.  Dana Allan, MD Family Medicine Residency      Certified letter sent to patient to notify of critical lab results  Dana Allan, MD [facility] Medicine Residency

## 2021-09-19 NOTE — Assessment & Plan Note (Signed)
Remains tachycardic withtout SOB or chest pain.  Ran out of Xarelto.  Was not able to seen by Hematology due to hospitalization. -Continue Xarelto 20 mg daily. -Samples provided 4 bottles of 7 tablets.  28 tablets total. -Advised to call and reschedule appointment with Hematology as soon as possible. -Has appointment with Pharmacy Jan 5.  Financial forms completed today for meds. -Follow up with PCP in 1-2 weeks

## 2021-09-21 NOTE — Progress Notes (Signed)
Received notification from [employer]) regarding approval for XARELTO. Patient assistance approved from 09/17/21 to 10/02/21.  *APPROVAL DATE IS UNTIL THE END OF 2022 ON THE LETTER SENT TO OFFICE. PT MAY HAVE TO REAPPLY FOR ASSISTANCE IN January 2023  Phone: [phone]  CARD INFO BIN: [id number] GROUP: [id number] ID: [id number]  PT WILL TAKE THE PHARMACY CARD THEY RECEIVE IN THE MAIL TO PHARMACY OF THEIR CHOICE FOR A MONTH SUPPLY OF MEDICATION.

## 2021-09-21 NOTE — Progress Notes (Signed)
Thank you.  Dana Allan, MD Family Medicine Residency

## 2021-10-06 ENCOUNTER — Ambulatory Visit: Payer: Medicaid Other | Admitting: Pharmacist

## 2021-11-09 NOTE — Telephone Encounter (Signed)
Certified letter from 09/19/21 was returned due to unclaimed status. Placed in sender's box.

## 2021-11-15 NOTE — Telephone Encounter (Signed)
Thank you.  I think she had seen the results in MyChart so I will forward to PCP to make him aware.    Dana Allan, MD Family Medicine Residency

## 2022-03-08 ENCOUNTER — Encounter: Payer: Self-pay | Admitting: *Deleted

## 2023-06-09 ENCOUNTER — Inpatient Hospital Stay: Admit: 2023-06-09

## 2023-06-09 ENCOUNTER — Emergency Department: Admit: 2023-06-09

## 2023-06-09 DIAGNOSIS — I471 SVT (supraventricular tachycardia) (CMS-HCC: 96): Secondary | ICD-10-CM

## 2023-06-09 DIAGNOSIS — E1110 Type 2 diabetes mellitus with ketoacidosis without coma: Secondary | ICD-10-CM

## 2023-06-09 DIAGNOSIS — R55 Syncope and collapse: Secondary | ICD-10-CM

## 2023-06-09 DIAGNOSIS — R079 Chest pain, unspecified: Principal | ICD-10-CM

## 2023-06-09 DIAGNOSIS — I2602 Saddle embolus of pulmonary artery with acute cor pulmonale: Secondary | ICD-10-CM

## 2023-06-09 DIAGNOSIS — E119 Type 2 diabetes mellitus without complications: Principal | ICD-10-CM

## 2023-06-09 MED ORDER — INSULIN ASPART 100 UNIT/ML IJ SOLN
0-3 [IU] | Freq: Three times a day (TID) | SUBCUTANEOUS | Status: CP
Start: 2023-06-09 — End: ?

## 2023-06-09 MED ORDER — DEXTROSE IN LACTATED RINGERS 5 % IV SOLN
INTRAVENOUS | Status: CP | PRN
Start: 2023-06-09 — End: ?

## 2023-06-09 MED ORDER — PERFLUTREN PROTEIN A MICROSPH IV SUSP
.5-8.7 mL | INTRAVENOUS | Status: CP | PRN
Start: 2023-06-09 — End: ?

## 2023-06-09 MED ORDER — SODIUM CHLORIDE FLUSH 0.9 % IV SOLN
5 mL | Freq: Three times a day (TID) | INTRAVENOUS | Status: CP
Start: 2023-06-09 — End: ?

## 2023-06-09 MED ORDER — PHOSPHATE 7-19 GM/118ML PO SOLN JX
22.5 mmol | Freq: Three times a day (TID) | ORAL | Status: CP
Start: 2023-06-09 — End: ?

## 2023-06-09 MED ORDER — IOHEXOL 350 MG/ML IV SOLN SH
90 mL | Freq: Once | INTRAVENOUS | Status: CP
Start: 2023-06-09 — End: ?

## 2023-06-09 MED ORDER — PERFLUTREN LIPID MICROSPHERE 6.52 MG/ML IV SUSP
.2-1.3 mL | INTRAVENOUS | Status: CP | PRN
Start: 2023-06-09 — End: ?

## 2023-06-09 MED ORDER — INSULIN ASPART 100 UNIT/ML IJ SOLN
5 [IU] | Freq: Three times a day (TID) | SUBCUTANEOUS | Status: CP
Start: 2023-06-09 — End: ?

## 2023-06-09 MED ORDER — HEPARIN SODIUM (PORCINE) 5000 UNIT/ML IJ SOLN
40 [IU]/kg | INTRAVENOUS | Status: DC | PRN
Start: 2023-06-09 — End: 2023-06-09

## 2023-06-09 MED ORDER — BOLUS IV FLUID JX
Freq: Once | INTRAVENOUS | Status: CP
Start: 2023-06-09 — End: ?

## 2023-06-09 MED ORDER — GLUCOSE 4 G PO CHEW JX
16 g | ORAL | Status: CP | PRN
Start: 2023-06-09 — End: ?

## 2023-06-09 MED ORDER — ONDANSETRON HCL 4 MG/2 ML IJ SOLN SH
4 mg | Freq: Once | INTRAVENOUS | Status: CP
Start: 2023-06-09 — End: ?

## 2023-06-09 MED ORDER — MAGNESIUM SULFATE 2 G/50 ML IV SOLN UD JX
2 g | Freq: Once | INTRAVENOUS | Status: CP
Start: 2023-06-09 — End: ?

## 2023-06-09 MED ORDER — DEXTROSE 50 % IV SOLN
50 mL | INTRAVENOUS | Status: CP | PRN
Start: 2023-06-09 — End: ?

## 2023-06-09 MED ORDER — MUPIROCIN 2 % EX OINT
Freq: Two times a day (BID) | NASAL | Status: CP
Start: 2023-06-09 — End: ?

## 2023-06-09 MED ORDER — HEPARIN SODIUM (PORCINE) 5000 UNIT/ML IJ SOLN
80 [IU]/kg | Freq: Once | INTRAVENOUS | Status: DC
Start: 2023-06-09 — End: 2023-06-09

## 2023-06-09 MED ORDER — SODIUM CHLORIDE 0.9% FOR FLUSHES
20-180 mL | INTRAVENOUS | Status: CP | PRN
Start: 2023-06-09 — End: ?

## 2023-06-09 MED ORDER — HEPARIN SODIUM (PORCINE) 5000 UNIT/ML IJ SOLN
80 [IU]/kg | INTRAVENOUS | Status: DC | PRN
Start: 2023-06-09 — End: 2023-06-09

## 2023-06-09 MED ORDER — ORAL CARBOHYDRATE FOR HYPOGLYCEMIA JX
4-8 [oz_av] | ORAL | Status: CP | PRN
Start: 2023-06-09 — End: ?

## 2023-06-09 MED ORDER — SODIUM CHLORIDE FLUSH 0.9 % IV SOLN
5 mL | INTRAVENOUS | Status: CP | PRN
Start: 2023-06-09 — End: ?

## 2023-06-09 MED ORDER — ONDANSETRON HCL 4 MG/2 ML IJ SOLN SH
4 mg | Freq: Four times a day (QID) | INTRAVENOUS | Status: CP | PRN
Start: 2023-06-09 — End: ?

## 2023-06-09 MED ORDER — INSULIN GLARGINE 100 UNIT/ML SC SOLN
15 [IU] | Freq: Every morning | SUBCUTANEOUS | Status: CP
Start: 2023-06-09 — End: ?

## 2023-06-09 MED ORDER — HEPARIN 25000 UNITS/250 ML JX
5-30 [IU]/kg/h | INTRAVENOUS | Status: CP
Start: 2023-06-09 — End: ?

## 2023-06-09 MED ORDER — LACTATED RINGERS IV SOLN
INTRAVENOUS | Status: DC
Start: 2023-06-09 — End: 2023-06-10

## 2023-06-09 MED ORDER — INSULIN REGULAR(HUMAN) IN NACL 100-0.9 UT/100ML-% IV SOLN
.1 [IU]/kg/h | INTRAVENOUS | Status: DC
Start: 2023-06-09 — End: 2023-06-10

## 2023-06-10 MED ORDER — HEPARIN SODIUM (PORCINE) 5000 UNIT/ML IJ SOLN
40 [IU]/kg | INTRAVENOUS | Status: DC | PRN
Start: 2023-06-10 — End: 2023-06-10

## 2023-06-10 MED ORDER — INSULIN ASPART 100 UNIT/ML IJ SOLN
5 [IU] | Freq: Once | SUBCUTANEOUS | Status: CP
Start: 2023-06-10 — End: ?

## 2023-06-10 MED ORDER — INSULIN GLARGINE 100 UNIT/ML SC SOLN
25 [IU] | Freq: Every morning | SUBCUTANEOUS | Status: CP
Start: 2023-06-10 — End: ?

## 2023-06-10 MED ORDER — HEPARIN SODIUM (PORCINE) 5000 UNIT/ML IJ SOLN
40 [IU]/kg | INTRAVENOUS | Status: CP | PRN
Start: 2023-06-10 — End: ?

## 2023-06-10 MED ORDER — HEPARIN SODIUM (PORCINE) 5000 UNIT/ML IJ SOLN
80 [IU]/kg | INTRAVENOUS | Status: CP | PRN
Start: 2023-06-10 — End: ?

## 2023-06-10 MED ORDER — INSULIN ASPART 100 UNIT/ML IJ SOLN
8 [IU] | Freq: Three times a day (TID) | SUBCUTANEOUS | Status: CP
Start: 2023-06-10 — End: ?

## 2023-06-10 MED ORDER — INSULIN GLARGINE 100 UNIT/ML SC SOLN
22 [IU] | Freq: Every morning | SUBCUTANEOUS | Status: DC
Start: 2023-06-10 — End: 2023-06-10

## 2023-06-11 MED ORDER — ORAL CARBOHYDRATE FOR HYPOGLYCEMIA JX
4-8 [oz_av] | ORAL | Status: CP | PRN
Start: 2023-06-11 — End: ?

## 2023-06-11 MED ORDER — INSULIN GLARGINE 100 UNIT/ML SC SOLN
25 [IU] | Freq: Every evening | SUBCUTANEOUS | Status: CP
Start: 2023-06-11 — End: ?

## 2023-06-11 MED ORDER — GLUCOSE 4 G PO CHEW JX
16 g | ORAL | Status: CP | PRN
Start: 2023-06-11 — End: ?

## 2023-06-11 MED ORDER — DEXTROSE 50 % IV SOLN
50 mL | INTRAVENOUS | Status: CP | PRN
Start: 2023-06-11 — End: ?

## 2023-06-12 MED ORDER — APIXABAN 5 MG PO TABS
10 mg | Freq: Two times a day (BID) | ORAL | Status: CP
Start: 2023-06-12 — End: ?

## 2023-06-12 MED ORDER — INSULIN NPH ISOPHANE & REGULAR (70-30) 100 UNIT/ML SC SUSP
40 [IU] | Freq: Every morning | SUBCUTANEOUS | Status: DC
Start: 2023-06-12 — End: 2023-06-12

## 2023-06-12 MED ORDER — INSULIN NPH ISOPHANE & REGULAR (70-30) 100 UNIT/ML SC SUSP
20 [IU] | Freq: Every evening | SUBCUTANEOUS | Status: DC
Start: 2023-06-12 — End: 2023-06-12

## 2023-06-12 MED ORDER — INSULIN GLARGINE 100 UNIT/ML SC SOLN
30 [IU] | Freq: Every evening | SUBCUTANEOUS | Status: CP
Start: 2023-06-12 — End: ?

## 2023-06-12 MED ORDER — LISINOPRIL 2.5 MG PO TABS
2.5 mg | Freq: Every day | ORAL | Status: CP
Start: 2023-06-12 — End: ?

## 2023-06-12 MED ORDER — APIXABAN 5 MG PO TABS
5 mg | Freq: Two times a day (BID) | ORAL | Status: CP
Start: 2023-06-12 — End: ?

## 2023-06-12 MED ORDER — LACTATED RINGERS IV SOLN
INTRAVENOUS | Status: CP
Start: 2023-06-12 — End: ?

## 2023-06-12 MED ORDER — POTASSIUM CHLORIDE 20 MEQ/15ML (10%) PO SOLN
20 meq | Freq: Once | ORAL | Status: CP
Start: 2023-06-12 — End: ?

## 2023-06-12 MED ORDER — INSULIN ASPART 100 UNIT/ML IJ SOLN
10 [IU] | Freq: Three times a day (TID) | SUBCUTANEOUS | Status: CP
Start: 2023-06-12 — End: ?

## 2023-06-12 MED ORDER — K PHOS MONO-SOD PHOS DI & MONO 155-852-130 MG PO TABS
8 mmol | Freq: Two times a day (BID) | ORAL | Status: CP
Start: 2023-06-12 — End: ?

## 2023-06-12 MED ORDER — APIXABAN 5 MG PO TABS
5 mg | Freq: Two times a day (BID) | ORAL | Status: DC
Start: 2023-06-12 — End: 2023-06-12

## 2023-06-12 MED ORDER — METFORMIN HCL ER 500 MG PO TB24
500 mg | Freq: Every day | ORAL | Status: CP
Start: 2023-06-12 — End: ?

## 2023-06-12 MED ORDER — MAGNESIUM OXIDE -MG SUPPLEMENT 400 (240 MG) MG PO TABS
400 mg | Freq: Two times a day (BID) | ORAL | Status: CP
Start: 2023-06-12 — End: ?

## 2023-06-13 MED ORDER — METFORMIN HCL ER 500 MG PO TB24
1000 mg | Freq: Every day | ORAL | Status: DC
Start: 2023-06-13 — End: 2023-06-13

## 2023-06-13 MED ORDER — INSULIN GLARGINE 100 UNIT/ML SC SOLN
5 [IU] | Freq: Once | SUBCUTANEOUS | Status: CP
Start: 2023-06-13 — End: ?

## 2023-06-13 MED ORDER — INSULIN ASPART 100 UNIT/ML IJ SOLN
12 [IU] | Freq: Three times a day (TID) | SUBCUTANEOUS | Status: DC
Start: 2023-06-13 — End: 2023-06-13

## 2023-06-13 MED ORDER — MAGNESIUM OXIDE -MG SUPPLEMENT 400 (240 MG) MG PO TABS
400 mg | Freq: Every day | ORAL | Status: CP
Start: 2023-06-13 — End: ?

## 2023-06-13 MED ORDER — INSULIN NPH ISOPHANE & REGULAR (70-30) 100 UNIT/ML SC SUSP
SUBCUTANEOUS | 0 refills | 30.00000 days | Status: CP
Start: 2023-06-13 — End: ?

## 2023-06-13 MED ORDER — METFORMIN HCL ER 500 MG PO TB24
1000 mg | Freq: Every day | ORAL | 0 refills | 90.00 days | Status: CP
Start: 2023-06-13 — End: ?
  Filled 2023-06-13: qty 60, 30d supply, fill #0

## 2023-06-13 MED ORDER — LISINOPRIL 2.5 MG PO TABS
2.5 mg | Freq: Every day | ORAL | 0 refills | Status: CP
Start: 2023-06-13 — End: ?
  Filled 2023-06-13: qty 30, 30d supply, fill #0

## 2023-06-13 MED ORDER — METFORMIN HCL ER 500 MG PO TB24
500 mg | Freq: Once | ORAL | Status: DC
Start: 2023-06-13 — End: 2023-06-13

## 2023-06-13 MED ORDER — APIXABAN 5 MG PO TABS
ORAL | 0 refills | Status: CP
Start: 2023-06-13 — End: ?
  Filled 2023-06-13: qty 66, 30d supply, fill #0

## 2023-06-13 MED ORDER — INSULIN GLARGINE 100 UNIT/ML SC SOLN
32 [IU] | Freq: Every evening | SUBCUTANEOUS | Status: DC
Start: 2023-06-13 — End: 2023-06-13

## 2023-06-13 MED ORDER — INSULIN NPH ISOPHANE & REGULAR (70-30) 100 UNIT/ML SC SUSP
SUBCUTANEOUS | 0 refills | 30.00000 days | Status: CP
Start: 2023-06-13 — End: 2023-06-13
  Filled 2023-06-13: qty 20, 29d supply, fill #0

## 2023-06-13 MED ORDER — INSULIN SYRINGE-NEEDLE U-100 31G X 5/16" 0.5 ML MISC
INJECTION | 0 refills | 31.00 days | Status: CP
Start: 2023-06-13 — End: ?
  Filled 2023-06-13: qty 60, 30d supply, fill #0

## 2023-06-14 ENCOUNTER — Telehealth: Payer: Self-pay

## 2023-06-14 NOTE — Transitions of Care (Post Inpatient/ED Visit) (Unsigned)
   06/14/2023  Name: Annette [name] MRN: [id number] DOB: 03-03-1991  Today's TOC FU Call Status: Today's TOC FU Call Status:: Unsuccessful Call (1st Attempt) Unsuccessful Call (1st Attempt) Date: 06/14/23  Attempted to reach the patient regarding the most recent Inpatient/ED visit.  Follow Up Plan: Additional outreach attempts will be made to reach the patient to complete the Transitions of Care (Post Inpatient/ED visit) call.   Signature Karena Addison, LPN [facility] Nurse Health Advisor Direct Dial [phone]

## 2023-06-15 NOTE — Transitions of Care (Post Inpatient/ED Visit) (Signed)
   06/15/2023  Name: Annette [name] MRN: [id number] DOB: 06-14-1991  Today's TOC FU Call Status: Today's TOC FU Call Status:: Successful TOC FU Call Completed Unsuccessful Call (1st Attempt) Date: 06/14/23 [facility] FU Call Complete Date: 06/15/23 Patient's Name and Date of Birth confirmed.  Transition Care Management Follow-up Telephone Call Date of Discharge: 06/13/23 Discharge Facility: Other [facility]) Name of Other ([facility]) [facility] Facility[facility] Type of Discharge: Inpatient Admission Primary Inpatient Discharge Diagnosis:: ketoacidosis How have you been since you were released from the hospital?: Better Any questions or concerns?: No  Items Reviewed: Did you receive and understand the discharge instructions provided?: No Medications obtained,verified, and reconciled?: Yes (Medications Reviewed) Any new allergies since your discharge?: No Dietary orders reviewed?: Yes Do you have support at home?: Yes People in Home: parent(s)  Medications Reviewed Today: Medications Reviewed Today     Reviewed by Karena Addison, LPN (Licensed Practical Nurse) on 06/15/23 at 1222  Med List Status: <None>   Medication Order Taking? Sig Documenting Provider Last Dose Status Informant  Accu-Chek Softclix Lancets lancets [id number]  Use as directed. Billey Co, MD  Active Multiple Informants  Blood Glucose Monitoring Suppl (BLOOD GLUCOSE MONITOR SYSTEM) w/Device KIT [id number]  Use to test blood sugars up to 4 times daily as directed. Billey Co, MD  Active Multiple Informants  glucose blood test strip [id number]  Use to test blood sugars up to 4 times daily as directed. Billey Co, MD  Active Multiple Informants  Insulin Glargine [facility] KWIKPEN) 100 UNIT/ML [id number]  Inject 30 Units into the skin daily. Dana Allan, MD  Active   Insulin Pen Needle 32G X 4 MM MISC [id number]  Use to inject insulin as directed up to 4 times daily. Billey Co, MD  Active Multiple  Informants  metFORMIN (GLUCOPHAGE XR) 500 MG 24 hr tablet [id number] No Take 1 tablet (500 mg total) by mouth daily with breakfast. Dana Allan, MD Past Week Expired 08/04/22 2359 Multiple Informants  rivaroxaban (XARELTO) 20 MG TABS tablet [id number]  Take 1 tablet (20 mg total) by mouth daily with supper. Princess Bruins, DO  Active             Home Care and Equipment/Supplies: Were Home Health Services Ordered?: NA Any new equipment or medical supplies ordered?: NA  Functional Questionnaire: Do you need assistance with bathing/showering or dressing?: No Do you need assistance with meal preparation?: No Do you need assistance with eating?: No Do you have difficulty maintaining continence: No Do you need assistance with getting out of bed/getting out of a chair/moving?: No Do you have difficulty managing or taking your medications?: No  Follow up appointments reviewed: PCP Follow-up appointment confirmed?: NA Specialist Hospital Follow-up appointment confirmed?: NA Do you need transportation to your follow-up appointment?: No Do you understand care options if your condition(s) worsen?: Yes-patient verbalized understanding    SIGNATURE Karena Addison, LPN [facility] Nurse Health Advisor Direct Dial [phone]

## 2023-08-28 IMAGING — US US RENAL
1 series · 14 of 25 positions shown · non-contrast
Comparison: None.

CLINICAL DATA: Acute kidney injury

EXAM:
RENAL / URINARY TRACT ULTRASOUND COMPLETE

[Series 1: us renal · 14 of 55 slices shown]
[im 1/55]
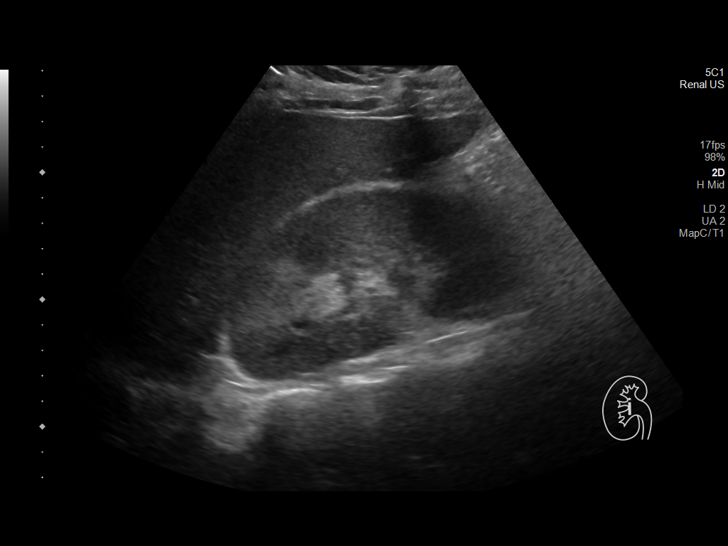
[im 5/55]
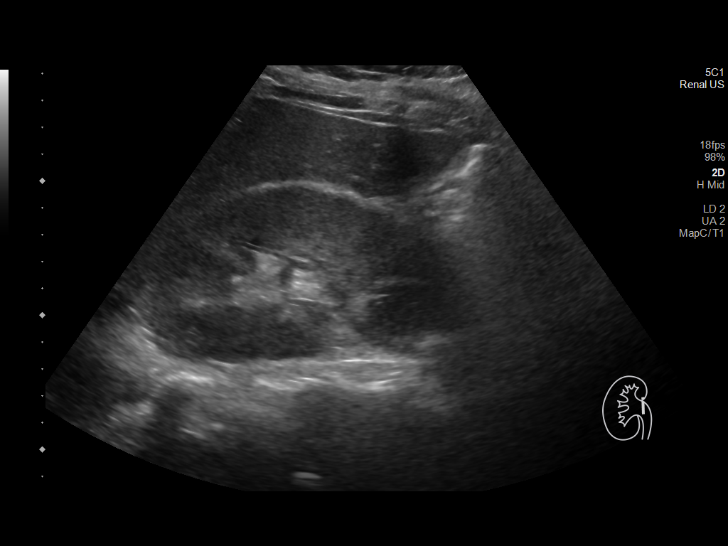
[im 10/55]
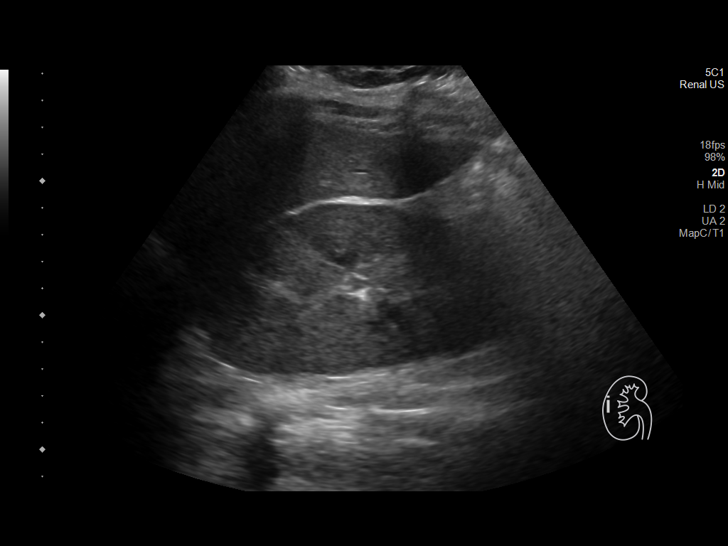
[im 14/55]
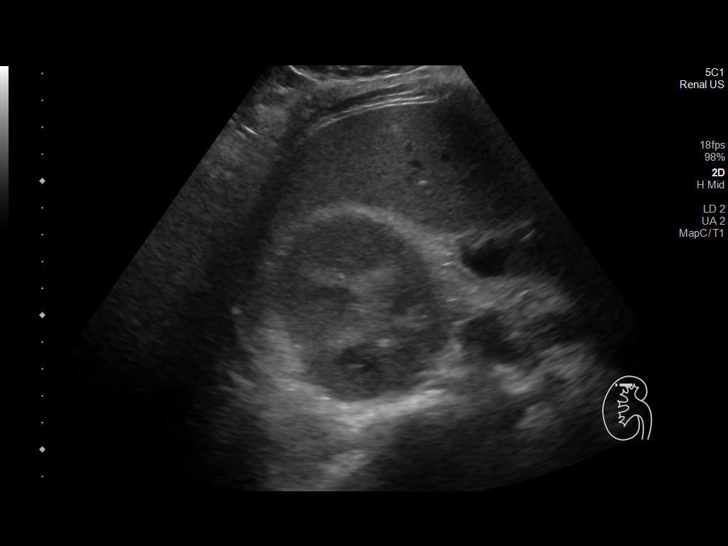
[im 19/55]
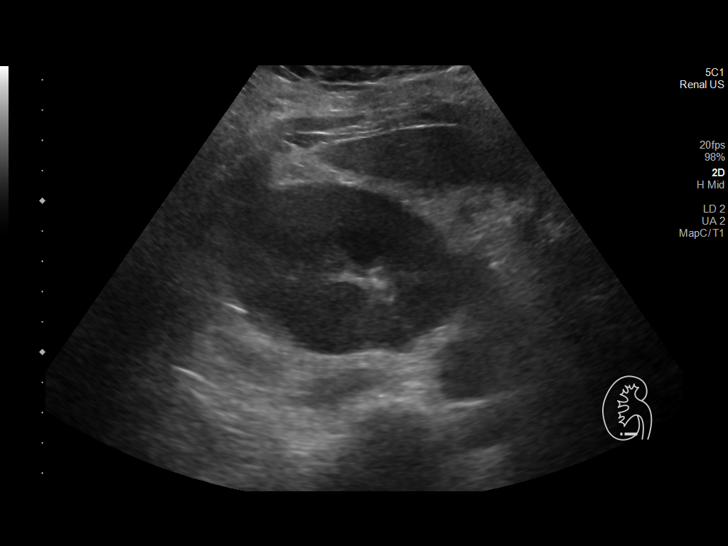
[im 21/55]
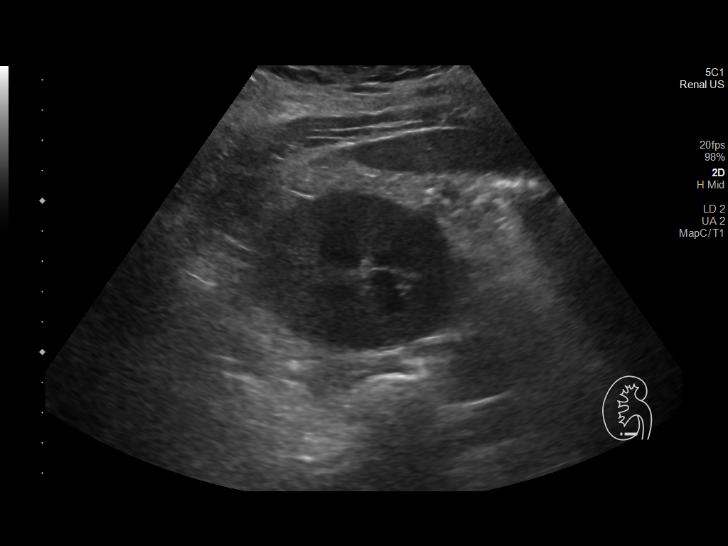
[im 25/55]
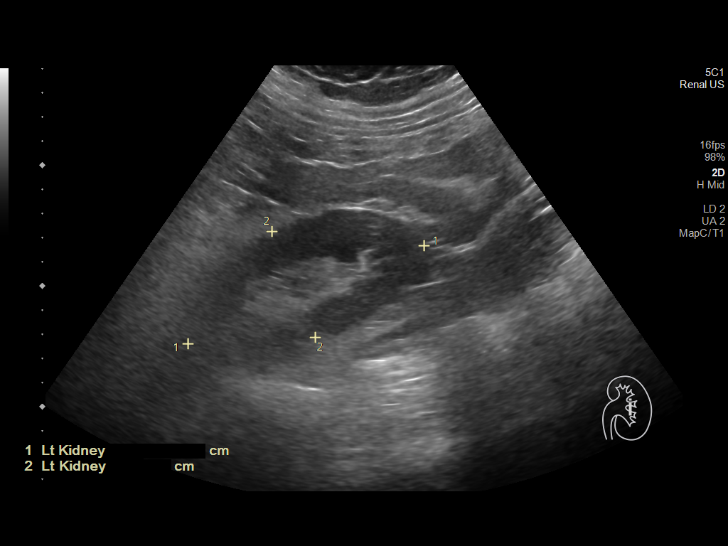
[im 30/55]
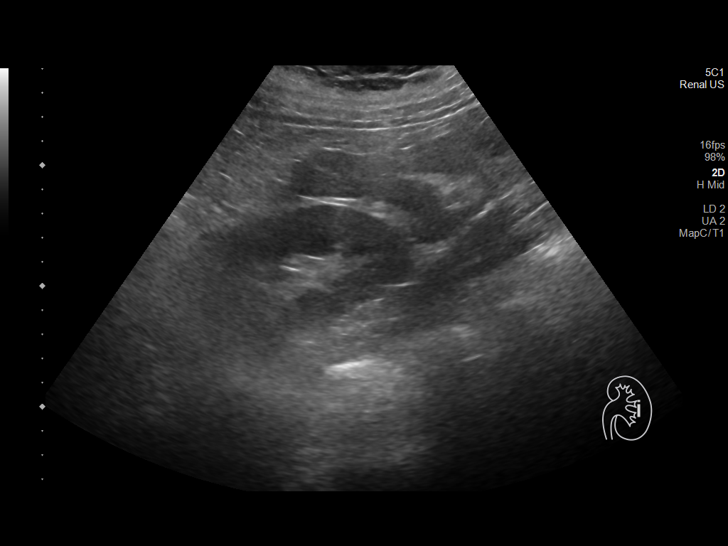
[im 34/55]
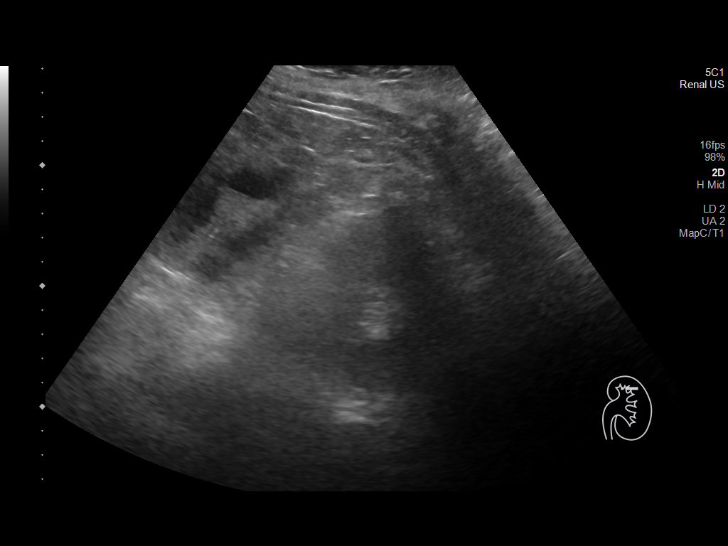
[im 37/55]
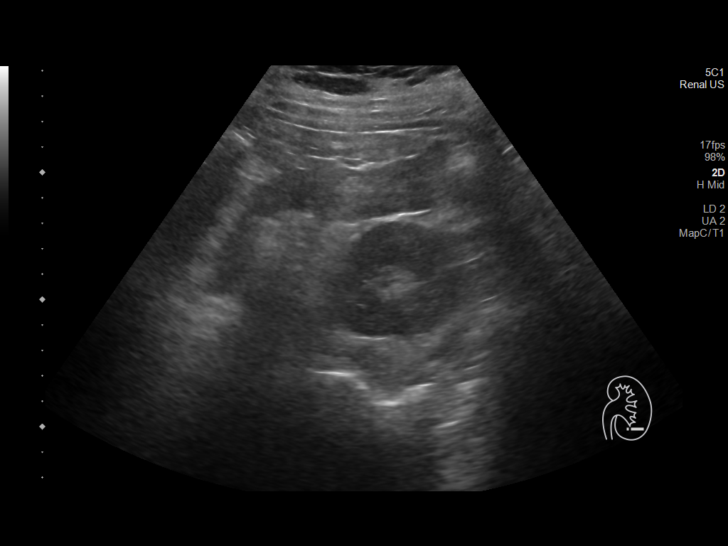
[im 41/55]
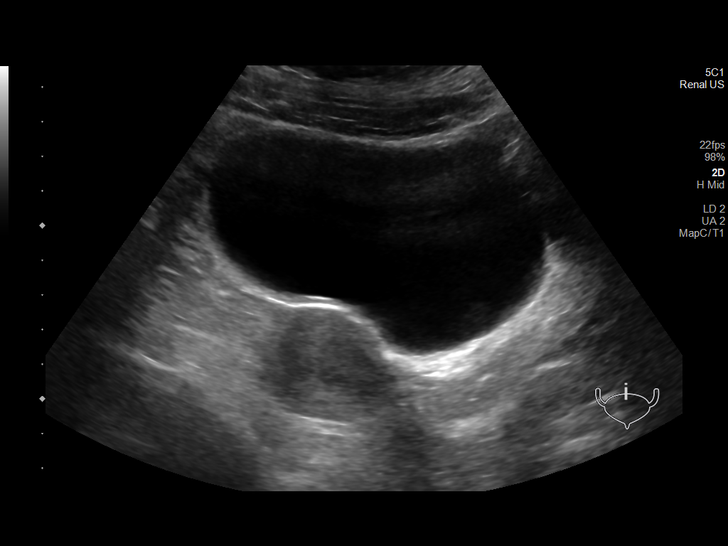
[im 46/55]
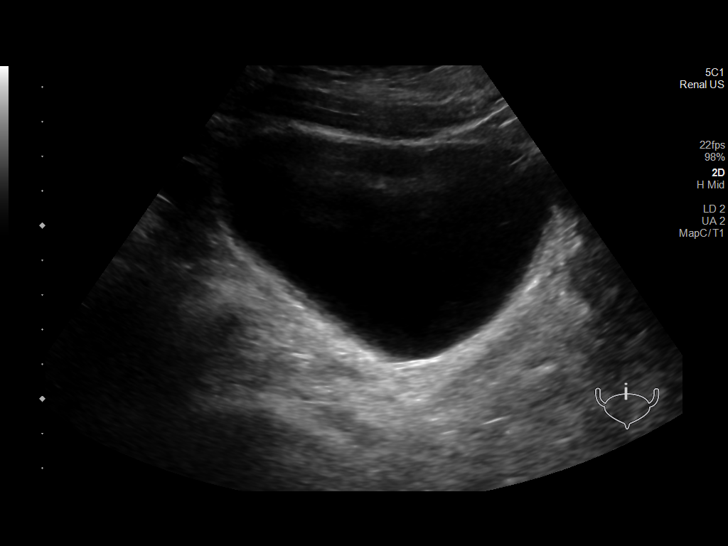
[im 50/55]
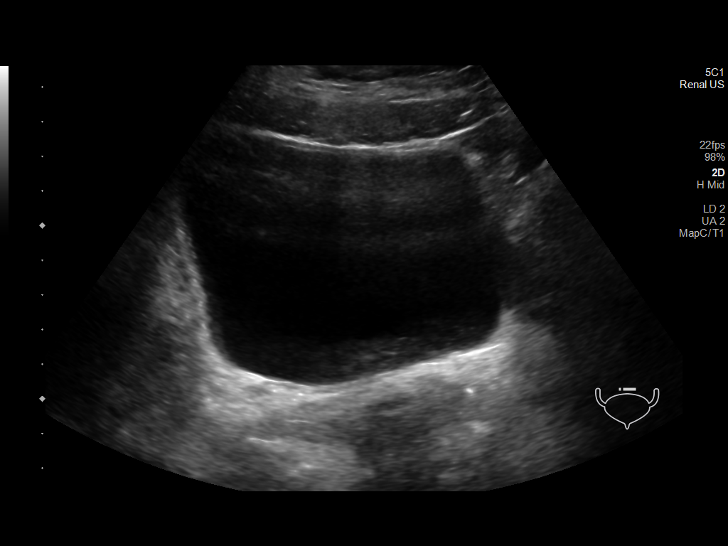
[im 55/55]
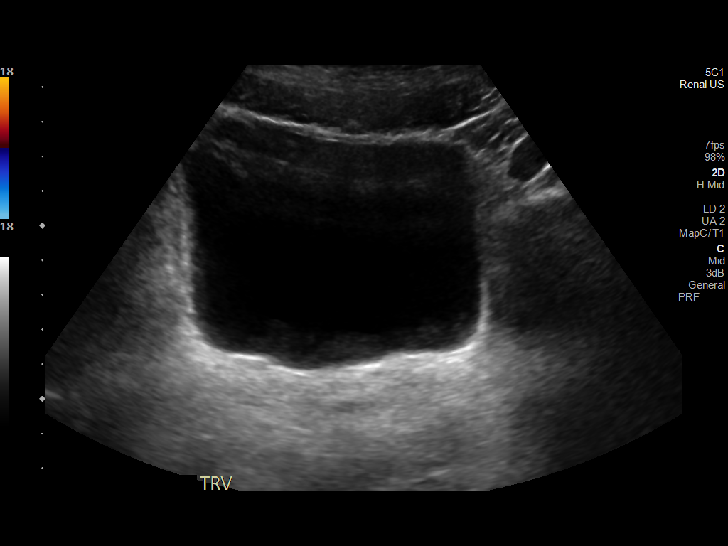

[14 of 25 positions shown; findings below may reference images not displayed]

FINDINGS: Right Kidney:

Renal measurements: 12.3 x 6.7 x 6.2 = volume: 264 mL. Echogenicity
within normal limits. No mass or hydronephrosis visualized.

Left Kidney:

Renal measurements: 10.6 x 4.7 x 5.1 = volume: 133 mL. Echogenicity
within normal limits. No mass or hydronephrosis visualized.

Bladder:

Appears normal for degree of bladder distention. Echogenic debris in
the dependent urinary bladder.

Other:

None.
IMPRESSION: 1.  No evidence of nephrolithiasis or hydronephrosis.

2. No urinary bladder wall thickening. Echogenic debris in the
dependent urinary bladder.

## 2023-08-31 IMAGING — DX DG ABD PORTABLE 1V
1 series · 1 of 1 positions shown · non-contrast
Comparison: None.

CLINICAL DATA: Feeding tube placement

EXAM:
PORTABLE ABDOMEN - 1 VIEW

[abdomen kub]
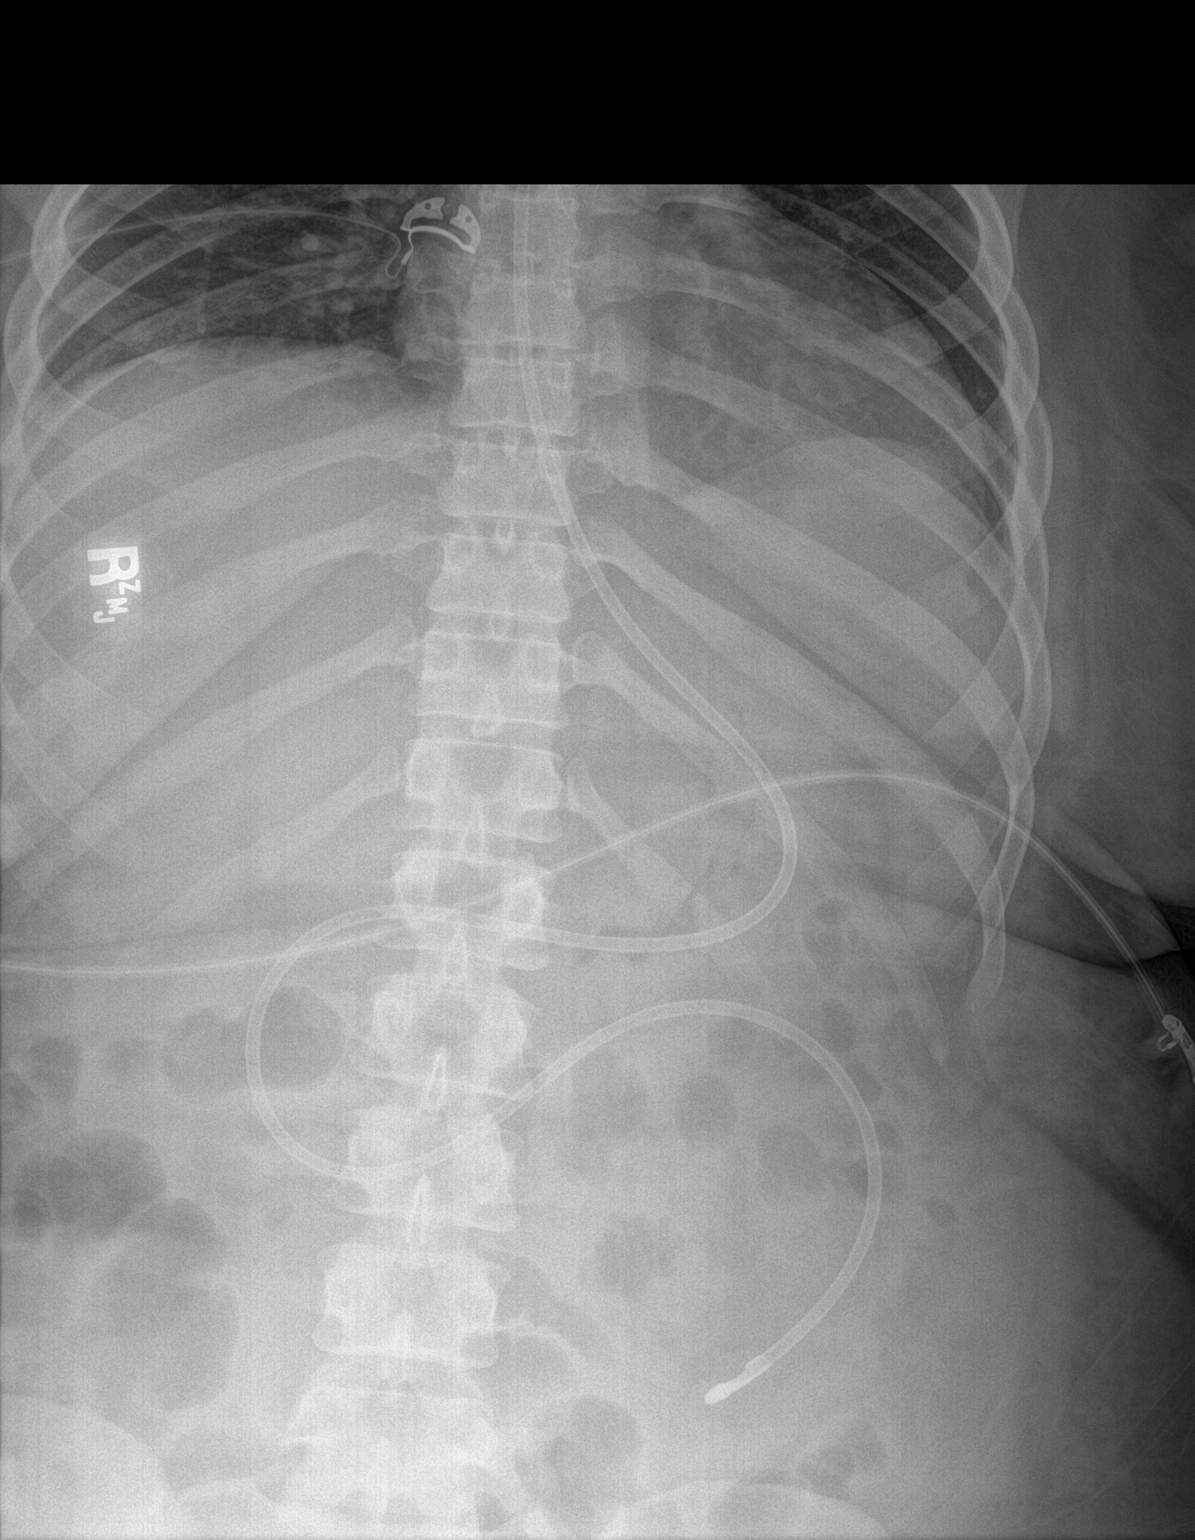

[1 of 1 positions shown; findings below may reference images not displayed]

FINDINGS: Weighted tip feeding tube has been advanced into the proximal
jejunum beyond the ligament of Treitz. Visualized bowel gas pattern
is normal. Regional bones unremarkable. Visualized lung bases clear.
IMPRESSION: Feeding tube advancement to proximal jejunum.

## 2023-09-18 ENCOUNTER — Emergency Department: Admit: 2023-09-18 | Discharge: 2023-09-18

## 2023-09-18 ENCOUNTER — Inpatient Hospital Stay: Admit: 2023-09-18 | Discharge: 2023-09-18

## 2023-09-18 DIAGNOSIS — R739 Hyperglycemia, unspecified: Secondary | ICD-10-CM

## 2023-09-18 MED ORDER — BOLUS IV FLUID JX
Freq: Once | INTRAVENOUS | Status: CP
Start: 2023-09-18 — End: ?

## 2023-09-18 MED ORDER — INSULIN REGULAR HUMAN 100 UNIT/ML IJ SOLN
10 [IU] | Freq: Once | INTRAVENOUS | Status: CP
Start: 2023-09-18 — End: ?

## 2023-09-18 MED ORDER — DOXYCYCLINE HYCLATE 100 MG PO TABS
100 mg | Freq: Two times a day (BID) | ORAL | 0 refills | Status: CP
Start: 2023-09-18 — End: ?

## 2023-09-18 MED ORDER — AMOXICILLIN-POT CLAVULANATE 875-125 MG PO TABS
1 | ORAL_TABLET | Freq: Two times a day (BID) | ORAL | 0 refills | 10.00000 days | Status: CP
Start: 2023-09-18 — End: ?

## 2023-09-18 MED ORDER — AMOXICILLIN-POT CLAVULANATE 875-125 MG PO TABS
1 | ORAL_TABLET | Freq: Once | ORAL | Status: CP
Start: 2023-09-18 — End: ?

## 2023-09-18 MED ORDER — DOXYCYCLINE HYCLATE 100 MG PO CAPS
100 mg | Freq: Once | ORAL | Status: CP
Start: 2023-09-18 — End: ?

## 2024-07-02 ENCOUNTER — Emergency Department: Admit: 2024-07-02 | Payer: Medicaid - Out of State

## 2024-07-02 ENCOUNTER — Inpatient Hospital Stay: Admit: 2024-07-02 | Payer: Medicaid - Out of State

## 2024-07-02 DIAGNOSIS — R944 Abnormal results of kidney function studies: Secondary | ICD-10-CM

## 2024-07-02 DIAGNOSIS — I2692 Saddle embolus of pulmonary artery without acute cor pulmonale: Secondary | ICD-10-CM

## 2024-07-02 DIAGNOSIS — R739 Hyperglycemia, unspecified: Secondary | ICD-10-CM

## 2024-07-02 DIAGNOSIS — Z86711 Personal history of pulmonary embolism: Secondary | ICD-10-CM

## 2024-07-02 DIAGNOSIS — R0600 Dyspnea, unspecified: Secondary | ICD-10-CM

## 2024-07-02 DIAGNOSIS — T730XXA Starvation, initial encounter: Secondary | ICD-10-CM

## 2024-07-02 DIAGNOSIS — Z91199 Noncompliance: Secondary | ICD-10-CM

## 2024-07-02 DIAGNOSIS — E871 Hypo-osmolality and hyponatremia: Secondary | ICD-10-CM

## 2024-07-02 DIAGNOSIS — R7989 Other specified abnormal findings of blood chemistry: Secondary | ICD-10-CM

## 2024-07-02 DIAGNOSIS — D649 Anemia, unspecified: Secondary | ICD-10-CM

## 2024-07-02 DIAGNOSIS — R779 Abnormality of plasma protein, unspecified: Secondary | ICD-10-CM

## 2024-07-02 DIAGNOSIS — Z86718 Personal history of other venous thrombosis and embolism: Secondary | ICD-10-CM

## 2024-07-02 DIAGNOSIS — R079 Chest pain, unspecified: Secondary | ICD-10-CM

## 2024-07-02 DIAGNOSIS — R000 Tachycardia, unspecified: Principal | ICD-10-CM

## 2024-07-02 DIAGNOSIS — E119 Type 2 diabetes mellitus without complications: Secondary | ICD-10-CM

## 2024-07-02 DIAGNOSIS — F109 Alcohol use disorder: Secondary | ICD-10-CM

## 2024-07-02 MED ORDER — SODIUM CHLORIDE 0.9% FOR FLUSHES
20-180 mL | INTRAVENOUS | Status: CP | PRN
Start: 2024-07-02 — End: ?

## 2024-07-02 MED ORDER — LACTATED RINGERS IV SOLN
INTRAVENOUS | Status: CP
Start: 2024-07-02 — End: ?

## 2024-07-02 MED ORDER — PERFLUTREN PROTEIN A MICROSPH IV SUSP
.5-8.7 mL | INTRAVENOUS | Status: CP | PRN
Start: 2024-07-02 — End: ?

## 2024-07-02 MED ORDER — MAGNESIUM SULFATE 2 G/50 ML IV SOLN UD JX
2 g | INTRAVENOUS | Status: CP | PRN
Start: 2024-07-02 — End: ?

## 2024-07-02 MED ORDER — ACETAMINOPHEN 325 MG PO TABS
650 mg | Freq: Once | ORAL | Status: CP
Start: 2024-07-02 — End: ?

## 2024-07-02 MED ORDER — INSULIN ASPART 100 UNIT/ML IJ SOLN
0-3 [IU] | SUBCUTANEOUS | Status: DC
Start: 2024-07-02 — End: 2024-07-03

## 2024-07-02 MED ORDER — HEPARIN 25000 UNITS/250 ML JX
5-30 [IU]/kg/h | INTRAVENOUS | Status: DC
Start: 2024-07-02 — End: 2024-07-03

## 2024-07-02 MED ORDER — INSULIN ASPART 100 UNIT/ML IJ SOLN
3 [IU] | Freq: Three times a day (TID) | SUBCUTANEOUS | Status: DC
Start: 2024-07-02 — End: 2024-07-03

## 2024-07-02 MED ORDER — INSULIN GLARGINE 100 UNIT/ML SC SOLN INTERCHANGEABLE
12 [IU] | Freq: Every evening | SUBCUTANEOUS | Status: DC
Start: 2024-07-02 — End: 2024-07-03

## 2024-07-02 MED ORDER — HEPARIN SODIUM (PORCINE) 5000 UNIT/ML IJ SOLN
80 [IU]/kg | Freq: Once | INTRAVENOUS | Status: CP
Start: 2024-07-02 — End: ?

## 2024-07-02 MED ORDER — ENOXAPARIN SODIUM 80 MG/0.8ML IJ SOSY
1 mg/kg | Freq: Two times a day (BID) | SUBCUTANEOUS | Status: DC
Start: 2024-07-02 — End: 2024-07-03

## 2024-07-02 MED ORDER — INSULIN REGULAR(HUMAN) IN NACL 100-0.9 UT/100ML-% IV SOLN
.05-.1 [IU]/kg/h | INTRAVENOUS | Status: CP
Start: 2024-07-02 — End: ?

## 2024-07-02 MED ORDER — ORAL CARBOHYDRATE FOR HYPOGLYCEMIA JX
4-8 [oz_av] | ORAL | Status: DC | PRN
Start: 2024-07-02 — End: 2024-07-03

## 2024-07-02 MED ORDER — TENECTEPLASE 50 MG IV KIT
Status: CP
Start: 2024-07-02 — End: ?

## 2024-07-02 MED ORDER — DEXTROSE 50 % IV SOLN
50 mL | INTRAVENOUS | Status: DC | PRN
Start: 2024-07-02 — End: 2024-07-03

## 2024-07-02 MED ORDER — MAGNESIUM SULFATE 4 G/100 ML IV SOLN UD JX
4 g | INTRAVENOUS | Status: CP | PRN
Start: 2024-07-02 — End: ?

## 2024-07-02 MED ORDER — INSULIN ASPART 100 UNIT/ML IJ SOLN
3 [IU] | Freq: Once | SUBCUTANEOUS | Status: DC
Start: 2024-07-02 — End: 2024-07-03

## 2024-07-02 MED ORDER — INSULIN GLARGINE 100 UNIT/ML SC SOLN INTERCHANGEABLE
15 [IU] | Freq: Every evening | SUBCUTANEOUS | Status: DC
Start: 2024-07-02 — End: 2024-07-03

## 2024-07-02 MED ORDER — HEPARIN SODIUM (PORCINE) 5000 UNIT/ML IJ SOLN
80 [IU]/kg | INTRAVENOUS | Status: DC | PRN
Start: 2024-07-02 — End: 2024-07-02

## 2024-07-02 MED ORDER — ENOXAPARIN SODIUM 80 MG/0.8ML IJ SOSY
1 mg/kg | Freq: Two times a day (BID) | SUBCUTANEOUS | Status: CP
Start: 2024-07-02 — End: ?

## 2024-07-02 MED ORDER — EPINEPHRINE HCL 0.1 MG/ML IJ CUSTOM SH
INTRAVENOUS | Status: CP
Start: 2024-07-02 — End: ?

## 2024-07-02 MED ORDER — HEPARIN 25000 UNITS/250 ML JX
5-30 [IU]/kg/h | INTRAVENOUS | Status: DC
Start: 2024-07-02 — End: 2024-07-02

## 2024-07-02 MED ORDER — INSULIN ASPART 100 UNIT/ML IJ SOLN
4 [IU] | Freq: Three times a day (TID) | SUBCUTANEOUS | Status: DC
Start: 2024-07-02 — End: 2024-07-03

## 2024-07-02 MED ORDER — INSULIN ASPART 100 UNIT/ML IJ SOLN
2 [IU] | Freq: Once | SUBCUTANEOUS | Status: CP
Start: 2024-07-02 — End: ?

## 2024-07-02 MED ORDER — GLUCOSE 4 G PO CHEW JX
16 g | ORAL | Status: DC | PRN
Start: 2024-07-02 — End: 2024-07-03

## 2024-07-02 MED ORDER — DEXTROSE 10 % IV SOLN
0-200 mL/h | INTRAVENOUS | Status: CP
Start: 2024-07-02 — End: ?

## 2024-07-02 MED ORDER — NOREPINEPHRINE 8 MG/250 ML IV SOLN CUSTOM JX
0-30 ug/min | INTRAVENOUS | Status: CP
Start: 2024-07-02 — End: ?

## 2024-07-02 MED ORDER — HEPARIN SODIUM (PORCINE) 5000 UNIT/ML IJ SOLN
80 [IU]/kg | Freq: Once | INTRAVENOUS | Status: DC
Start: 2024-07-02 — End: 2024-07-02

## 2024-07-02 MED ORDER — DO NOT VACCINATE - INFLUENZA JX
1 | Status: CP
Start: 2024-07-02 — End: ?

## 2024-07-02 MED ORDER — POTASSIUM CHLORIDE 20 MEQ/100ML IV SOLN
40 meq | INTRAVENOUS | Status: CP | PRN
Start: 2024-07-02 — End: ?

## 2024-07-02 MED ORDER — SODIUM CHLORIDE FLUSH 0.9 % IV SOLN
5 mL | INTRAVENOUS | Status: CP | PRN
Start: 2024-07-02 — End: ?

## 2024-07-02 MED ORDER — HEPARIN SODIUM (PORCINE) 5000 UNIT/ML IJ SOLN
80 [IU]/kg | INTRAVENOUS | Status: DC | PRN
Start: 2024-07-02 — End: 2024-07-03

## 2024-07-02 MED ORDER — BOLUS IV FLUID JX
Freq: Once | INTRAVENOUS | Status: CP
Start: 2024-07-02 — End: ?

## 2024-07-02 MED ORDER — SODIUM CHLORIDE FLUSH 0.9 % IV SOLN
5 mL | Freq: Three times a day (TID) | INTRAVENOUS | Status: CP
Start: 2024-07-02 — End: ?

## 2024-07-02 MED ORDER — HEPARIN SODIUM (PORCINE) 5000 UNIT/ML IJ SOLN
40 [IU]/kg | INTRAVENOUS | Status: DC | PRN
Start: 2024-07-02 — End: 2024-07-03

## 2024-07-02 MED ORDER — INSULIN ASPART 100 UNIT/ML IJ SOLN
5 [IU] | Freq: Once | SUBCUTANEOUS | Status: CP
Start: 2024-07-02 — End: ?

## 2024-07-02 MED ORDER — LACTATED RINGERS IV SOLN
0-200 mL/h | INTRAVENOUS | Status: CP
Start: 2024-07-02 — End: ?

## 2024-07-02 MED ORDER — INSULIN GLARGINE 100 UNIT/ML SC SOLN INTERCHANGEABLE
10 [IU] | Freq: Every evening | SUBCUTANEOUS | Status: DC
Start: 2024-07-02 — End: 2024-07-03

## 2024-07-02 MED ORDER — IOHEXOL 350 MG/ML IV SOLN SH
80 mL | Freq: Once | INTRAVENOUS | Status: CP
Start: 2024-07-02 — End: ?

## 2024-07-02 MED ORDER — HEPARIN SODIUM (PORCINE) 5000 UNIT/ML IJ SOLN
40 [IU]/kg | INTRAVENOUS | Status: DC | PRN
Start: 2024-07-02 — End: 2024-07-02

## 2024-07-02 MED ORDER — POTASSIUM CHLORIDE 20 MEQ/100ML IV SOLN
20 meq | INTRAVENOUS | Status: CP | PRN
Start: 2024-07-02 — End: ?

## 2024-07-02 MED ORDER — MUPIROCIN 2 % EX OINT
Freq: Two times a day (BID) | NASAL | Status: CP
Start: 2024-07-02 — End: ?

## 2024-07-02 MED ORDER — PERFLUTREN LIPID MICROSPHERE 6.52 MG/ML IV SUSP
.2-1.3 mL | INTRAVENOUS | Status: CP | PRN
Start: 2024-07-02 — End: ?

## 2024-07-02 MED ORDER — DEXTROSE 50 % IV SOLN
50 mL | INTRAVENOUS | Status: CP | PRN
Start: 2024-07-02 — End: ?

## 2024-07-03 MED ORDER — ACETAMINOPHEN 325 MG PO TABS
650 mg | ORAL | Status: CP | PRN
Start: 2024-07-03 — End: ?

## 2024-07-03 MED ORDER — ORAL CARBOHYDRATE FOR HYPOGLYCEMIA JX
4-8 [oz_av] | ORAL | Status: CP | PRN
Start: 2024-07-03 — End: ?

## 2024-07-03 MED ORDER — INSULIN ASPART 100 UNIT/ML IJ SOLN
0-10 [IU] | SUBCUTANEOUS | Status: DC
Start: 2024-07-03 — End: 2024-07-04

## 2024-07-03 MED ORDER — IOHEXOL 300 MG/ML IJ SOLN
200 mL | Freq: Once | INTRAVENOUS | Status: CP
Start: 2024-07-03 — End: ?

## 2024-07-03 MED ORDER — ACETAMINOPHEN 325 MG PO TABS
975 mg | Freq: Once | ORAL | Status: CP
Start: 2024-07-03 — End: ?

## 2024-07-03 MED ORDER — INSULIN ASPART 100 UNIT/ML IJ SOLN
0-3 [IU] | Freq: Three times a day (TID) | SUBCUTANEOUS | Status: DC
Start: 2024-07-03 — End: 2024-07-03

## 2024-07-03 MED ORDER — FLUTICASONE PROPIONATE 50 MCG/ACT NA SUSP
1 | Freq: Every day | NASAL | Status: CP
Start: 2024-07-03 — End: ?

## 2024-07-03 MED ORDER — INSULIN GLARGINE 100 UNIT/ML SC SOLN INTERCHANGEABLE
20 [IU] | Freq: Every evening | SUBCUTANEOUS | Status: CP
Start: 2024-07-03 — End: ?

## 2024-07-03 MED ORDER — LIDOCAINE HCL (PF) 2 % IJ SOLN SH
Freq: Once | SUBCUTANEOUS | Status: CP
Start: 2024-07-03 — End: ?

## 2024-07-03 MED ORDER — LIDOCAINE PATCH REMOVAL
1 | MEDICATED_PATCH | Freq: Every day | TRANSDERMAL | Status: CP
Start: 2024-07-03 — End: ?

## 2024-07-03 MED ORDER — TRAMADOL HCL 50 MG PO TABS
50 mg | Freq: Once | ORAL | Status: CP
Start: 2024-07-03 — End: ?

## 2024-07-03 MED ORDER — LIDOCAINE HCL (PF) 2 % IJ SOLN
Status: DC
Start: 2024-07-03 — End: 2024-07-03

## 2024-07-03 MED ORDER — INSULIN ASPART 100 UNIT/ML IJ SOLN
0-10 [IU] | SUBCUTANEOUS | Status: CP
Start: 2024-07-03 — End: ?

## 2024-07-03 MED ORDER — INSULIN GLARGINE 100 UNIT/ML SC SOLN INTERCHANGEABLE
15 [IU] | Freq: Every day | SUBCUTANEOUS | Status: DC
Start: 2024-07-03 — End: 2024-07-03

## 2024-07-03 MED ORDER — GLUCOSE 4 G PO CHEW JX
16 g | ORAL | Status: CP | PRN
Start: 2024-07-03 — End: ?

## 2024-07-03 MED ORDER — SODIUM CHLORIDE 0.9% FOR FLUSHES
20-180 mL | Status: CP | PRN
Start: 2024-07-03 — End: ?

## 2024-07-03 MED ORDER — DEXTROSE 50 % IV SOLN
50 mL | INTRAVENOUS | Status: CP | PRN
Start: 2024-07-03 — End: ?

## 2024-07-03 MED ORDER — FENTANYL CITRATE INJ 50 MCG/ML CUSTOM AMP/VIAL
25-50 ug | INTRAVENOUS | 0 refills | Status: DC | PRN
Start: 2024-07-03 — End: 2024-07-03

## 2024-07-04 MED ORDER — PYRIDOXINE HCL 25 MG PO TABS
25 mg | Freq: Every day | ORAL | Status: CP
Start: 2024-07-04 — End: ?

## 2024-07-04 MED ORDER — ERGOCALCIFEROL 1.25 MG (50000 UT) PO CAPS
1.25 mg | ORAL | Status: CP
Start: 2024-07-04 — End: ?

## 2024-07-04 MED ORDER — INSULIN ASPART 100 UNIT/ML IJ SOLN
0-5 [IU] | Freq: Three times a day (TID) | SUBCUTANEOUS | Status: DC
Start: 2024-07-04 — End: 2024-07-04

## 2024-07-04 MED ORDER — INSULIN GLARGINE 100 UNIT/ML SC SOLN INTERCHANGEABLE
20 [IU] | SUBCUTANEOUS | Status: DC
Start: 2024-07-04 — End: 2024-07-04

## 2024-07-04 MED ORDER — INSULIN GLARGINE 100 UNIT/ML SC SOLN INTERCHANGEABLE
23 [IU] | Freq: Every evening | SUBCUTANEOUS | Status: CP
Start: 2024-07-04 — End: ?

## 2024-07-04 MED ORDER — ORAL CARBOHYDRATE FOR HYPOGLYCEMIA JX
4-8 [oz_av] | ORAL | Status: CP | PRN
Start: 2024-07-04 — End: ?

## 2024-07-04 MED ORDER — RIVAROXABAN 20 MG PO TABS
20 mg | Freq: Every day | ORAL | 0 refills | 78.50000 days | Status: CP
Start: 2024-07-04 — End: 2024-07-04

## 2024-07-04 MED ORDER — GLUCOSE 4 G PO CHEW JX
16 g | ORAL | Status: CP | PRN
Start: 2024-07-04 — End: ?

## 2024-07-04 MED ORDER — MUPIROCIN 2 % EX OINT
Freq: Two times a day (BID) | NASAL | Status: CP
Start: 2024-07-04 — End: ?

## 2024-07-04 MED ORDER — INSULIN ASPART 100 UNIT/ML IJ SOLN
5 [IU] | Freq: Three times a day (TID) | SUBCUTANEOUS | Status: DC
Start: 2024-07-04 — End: 2024-07-04

## 2024-07-04 MED ORDER — CYANOCOBALAMIN 500 MCG PO TABS
500 ug | Freq: Every day | ORAL | Status: CP
Start: 2024-07-04 — End: ?

## 2024-07-04 MED ORDER — IRON SUCROSE INFUSION JX >/= 500 MG
500 mg | Freq: Every day | INTRAVENOUS | Status: CP
Start: 2024-07-04 — End: ?

## 2024-07-04 MED ORDER — INSULIN GLARGINE 100 UNIT/ML SC SOLN INTERCHANGEABLE
20 [IU] | Freq: Two times a day (BID) | SUBCUTANEOUS | Status: DC
Start: 2024-07-04 — End: 2024-07-04

## 2024-07-04 MED ORDER — INSULIN ASPART 100 UNIT/ML IJ SOLN
7 [IU] | Freq: Three times a day (TID) | SUBCUTANEOUS | Status: CP
Start: 2024-07-04 — End: ?

## 2024-07-04 MED ORDER — DEXTROSE 50 % IV SOLN
50 mL | INTRAVENOUS | Status: CP | PRN
Start: 2024-07-04 — End: ?

## 2024-07-05 MED ORDER — INSULIN ASPART 100 UNIT/ML IJ SOLN
0-3 [IU] | Freq: Every evening | SUBCUTANEOUS | Status: DC
Start: 2024-07-05 — End: 2024-07-05

## 2024-07-05 MED ORDER — INSULIN PEN NEEDLE 31G X 6 MM MISC
0 refills | Status: CP
Start: 2024-07-05 — End: ?
  Filled 2024-07-05: qty 100, 25d supply, fill #0

## 2024-07-05 MED ORDER — INSULIN GLARGINE 100 UNIT/ML SC SOLN INTERCHANGEABLE
35 [IU] | Freq: Every evening | SUBCUTANEOUS | Status: DC
Start: 2024-07-05 — End: 2024-07-05

## 2024-07-05 MED ORDER — INSULIN GLARGINE 100 UNIT/ML SC SOLN INTERCHANGEABLE
40 [IU] | Freq: Every evening | SUBCUTANEOUS | Status: DC
Start: 2024-07-05 — End: 2024-07-05

## 2024-07-05 MED ORDER — APIXABAN 5 MG PO TABS
5 mg | Freq: Two times a day (BID) | ORAL | Status: DC
Start: 2024-07-05 — End: 2024-07-05

## 2024-07-05 MED ORDER — INSULIN ASPART 100 UNIT/ML IJ SOLN
10 [IU] | Freq: Three times a day (TID) | SUBCUTANEOUS | Status: DC
Start: 2024-07-05 — End: 2024-07-05

## 2024-07-05 MED ORDER — ACCU-CHEK SOFTCLIX LANCETS MISC
ORAL_STRIP | 0 refills | Status: CP
Start: 2024-07-05 — End: ?
  Filled 2024-07-05: qty 100, 33d supply, fill #0

## 2024-07-05 MED ORDER — INSULIN GLARGINE 100 UNIT/ML SC SOLN INTERCHANGEABLE
30 [IU] | Freq: Every evening | SUBCUTANEOUS | Status: DC
Start: 2024-07-05 — End: 2024-07-05

## 2024-07-05 MED ORDER — BLOOD GLUCOSE TEST VI STRP
ORAL | 0 refills | 33.00000 days | Status: CP
Start: 2024-07-05 — End: ?
  Filled 2024-07-05: qty 100, 33d supply, fill #0

## 2024-07-05 MED ORDER — NOVOLOG FLEXPEN 100 UNIT/ML SC SOPN
10 [IU] | Freq: Three times a day (TID) | SUBCUTANEOUS | 0 refills | 33.00000 days | Status: CP
Start: 2024-07-05 — End: ?
  Filled 2024-07-05: qty 15, 50d supply, fill #0

## 2024-07-05 MED ORDER — INSULIN GLARGINE 100 UNIT/ML SC SOPN INTERCHANGEABLE
35 [IU] | Freq: Every evening | SUBCUTANEOUS | 0 refills | 28.00000 days | Status: CP
Start: 2024-07-05 — End: ?
  Filled 2024-07-05: qty 15, 42d supply, fill #0

## 2024-07-05 MED ORDER — APIXABAN 5 MG PO TABS
5 mg | Freq: Two times a day (BID) | ORAL | 0 refills | 30.00000 days | Status: CP
Start: 2024-07-05 — End: ?
  Filled 2024-07-05: qty 60, 30d supply, fill #0

## 2024-08-01 ENCOUNTER — Inpatient Hospital Stay: Payer: Medicaid - Out of State

## 2024-08-01 DIAGNOSIS — N898 Other specified noninflammatory disorders of vagina: Principal | ICD-10-CM

## 2024-08-01 DIAGNOSIS — B3731 Candidal vaginitis: Secondary | ICD-10-CM

## 2024-08-01 MED ORDER — FLUCONAZOLE 150 MG PO TABS
150 mg | Freq: Once | ORAL | Status: CP
Start: 2024-08-01 — End: ?

## 2024-08-01 MED ORDER — FLUCONAZOLE 150 MG PO TABS
150 mg | Freq: Once | ORAL | 0 refills | 2.50000 days | Status: CP
Start: 2024-08-01 — End: ?

## 2024-08-02 MED ORDER — METRONIDAZOLE 500 MG PO TABS
500 mg | Freq: Two times a day (BID) | ORAL | 0 refills | 7.00000 days | Status: CP
Start: 2024-08-02 — End: ?

## 2024-09-02 DIAGNOSIS — R059 Cough: Principal | ICD-10-CM

## 2024-09-02 DIAGNOSIS — Z86711 Personal history of pulmonary embolism: Secondary | ICD-10-CM

## 2024-09-02 DIAGNOSIS — E119 Type 2 diabetes mellitus without complications: Principal | ICD-10-CM

## 2024-09-03 ENCOUNTER — Emergency Department: Admit: 2024-09-03 | Discharge: 2024-09-03 | Payer: Medicaid - Out of State

## 2024-09-03 ENCOUNTER — Inpatient Hospital Stay

## 2024-09-03 MED ORDER — AZITHROMYCIN 250 MG PO TABS
ORAL | 0 refills | 5.00000 days | Status: CP
Start: 2024-09-03 — End: ?

## 2024-09-03 MED ORDER — APIXABAN 5 MG PO TABS
5 mg | Freq: Two times a day (BID) | ORAL | 2 refills | 30.00000 days | Status: CP
Start: 2024-09-03 — End: ?

## 2024-10-15 ENCOUNTER — Inpatient Hospital Stay: Payer: Medicaid - Out of State

## 2024-10-15 DIAGNOSIS — A6000 Herpesviral infection of urogenital system, unspecified: Principal | ICD-10-CM

## 2024-10-15 DIAGNOSIS — N898 Other specified noninflammatory disorders of vagina: Secondary | ICD-10-CM

## 2024-10-15 MED ORDER — ACYCLOVIR 800 MG PO TABS
800 mg | Freq: Three times a day (TID) | ORAL | 0 refills | 7.00000 days | Status: CP
Start: 2024-10-15 — End: ?

## 2024-10-15 MED ORDER — FLUCONAZOLE 150 MG PO TABS
150 mg | Freq: Once | ORAL | Status: CP
Start: 2024-10-15 — End: ?

## 2024-10-15 MED ORDER — ACYCLOVIR 800 MG PO TABS
800 mg | Freq: Three times a day (TID) | ORAL | 0 refills | 7.00000 days | Status: CP
Start: 2024-10-15 — End: 2024-10-16

## 2024-10-16 MED ORDER — METRONIDAZOLE 500 MG PO TABS
500 mg | Freq: Two times a day (BID) | ORAL | 0 refills | 12.00000 days | Status: CP
Start: 2024-10-16 — End: ?
